# Patient Record
Sex: Female | Born: 1970 | Race: Black or African American | Hispanic: No | Marital: Single
Health system: Southern US, Community
[De-identification: ages and names within clinical notes are randomized; demographics above are authoritative.]

## PROBLEM LIST (undated history)

## (undated) DIAGNOSIS — F32A Depression, unspecified: Secondary | ICD-10-CM

## (undated) DIAGNOSIS — E559 Vitamin D deficiency, unspecified: Secondary | ICD-10-CM

## (undated) DIAGNOSIS — M797 Fibromyalgia: Secondary | ICD-10-CM

## (undated) DIAGNOSIS — R7303 Prediabetes: Secondary | ICD-10-CM

## (undated) DIAGNOSIS — M549 Dorsalgia, unspecified: Secondary | ICD-10-CM

## (undated) DIAGNOSIS — N979 Female infertility, unspecified: Secondary | ICD-10-CM

## (undated) DIAGNOSIS — G4733 Obstructive sleep apnea (adult) (pediatric): Secondary | ICD-10-CM

## (undated) DIAGNOSIS — R0602 Shortness of breath: Secondary | ICD-10-CM

## (undated) DIAGNOSIS — M1990 Unspecified osteoarthritis, unspecified site: Secondary | ICD-10-CM | POA: Insufficient documentation

## (undated) DIAGNOSIS — G4730 Sleep apnea, unspecified: Secondary | ICD-10-CM

## (undated) DIAGNOSIS — D649 Anemia, unspecified: Secondary | ICD-10-CM

## (undated) DIAGNOSIS — E119 Type 2 diabetes mellitus without complications: Secondary | ICD-10-CM | POA: Insufficient documentation

## (undated) DIAGNOSIS — E781 Pure hyperglyceridemia: Secondary | ICD-10-CM | POA: Insufficient documentation

## (undated) DIAGNOSIS — M2550 Pain in unspecified joint: Secondary | ICD-10-CM

## (undated) DIAGNOSIS — I10 Essential (primary) hypertension: Secondary | ICD-10-CM

## (undated) DIAGNOSIS — M7989 Other specified soft tissue disorders: Secondary | ICD-10-CM

## (undated) HISTORY — DX: Dorsalgia, unspecified: M54.9

## (undated) HISTORY — DX: Sleep apnea, unspecified: G47.30

## (undated) HISTORY — DX: Prediabetes: R73.03

## (undated) HISTORY — DX: Vitamin D deficiency, unspecified: E55.9

## (undated) HISTORY — DX: Anemia, unspecified: D64.9

## (undated) HISTORY — DX: Other specified soft tissue disorders: M79.89

## (undated) HISTORY — DX: Depression, unspecified: F32.A

## (undated) HISTORY — DX: Female infertility, unspecified: N97.9

## (undated) HISTORY — DX: Essential (primary) hypertension: I10

## (undated) HISTORY — DX: Pain in unspecified joint: M25.50

## (undated) HISTORY — DX: Shortness of breath: R06.02

---

## 2020-05-06 ENCOUNTER — Emergency Department (HOSPITAL_BASED_OUTPATIENT_CLINIC_OR_DEPARTMENT_OTHER): Payer: Medicaid Other

## 2020-05-06 ENCOUNTER — Other Ambulatory Visit: Payer: Self-pay

## 2020-05-06 ENCOUNTER — Encounter (HOSPITAL_COMMUNITY): Payer: Self-pay | Admitting: *Deleted

## 2020-05-06 ENCOUNTER — Emergency Department (HOSPITAL_COMMUNITY): Payer: Medicaid Other

## 2020-05-06 ENCOUNTER — Emergency Department (HOSPITAL_COMMUNITY)
Admission: EM | Admit: 2020-05-06 | Discharge: 2020-05-06 | Disposition: A | Discharge status: Home / Self Care | Payer: Medicaid Other | Attending: Emergency Medicine | Admitting: Emergency Medicine

## 2020-05-06 DIAGNOSIS — M79604 Pain in right leg: Secondary | ICD-10-CM | POA: Insufficient documentation

## 2020-05-06 HISTORY — DX: Fibromyalgia: M79.7

## 2020-05-06 HISTORY — DX: Unspecified osteoarthritis, unspecified site: M19.90

## 2020-05-06 MED ORDER — KETOROLAC TROMETHAMINE 60 MG/2ML IM SOLN
15.0000 mg | Freq: Once | INTRAMUSCULAR | Status: AC
  Administered 2020-05-06: 15 mg via INTRAMUSCULAR
  Filled 2020-05-06: qty 2

## 2020-05-06 MED ORDER — DICLOFENAC SODIUM 50 MG PO TBEC: 50.0000 mg | DELAYED_RELEASE_TABLET | Freq: Two times a day (BID) | ORAL | 0 refills | Status: AC

## 2020-05-06 NOTE — Discharge Instructions (Addendum)
You have been seen today for leg pain. Please read and follow all provided instructions. Return to the emergency room for worsening condition or new concerning symptoms.    The x-ray showed you have osteoarthritis which you already knew. The ultrasound did not show any signs of blood clot.  However it does show you could possibly have a muscle tear in your proximal medial calf.  1. Medications:  Prescription sent to your pharmacy for diclofenac.  This is used to treat osteoarthritis.  Do not take any additional anti-inflammatories with this medicine they are all similar.  Take it with food so it does not cause upset stomach.  Continue usual home medications Take medications as prescribed. Please review all of the medicines and only take them if you do not have an allergy to them.   2. Treatment: Elevate your leg is much as possible.  Wear a knee sleeve as we discussed.  3. Follow Up:   Please follow-up with orthopedics.  Dr. Lyla Glassing is on-call orthopedist.  Call his office tomorrow and ask to schedule the next available emergency department follow-up appointment  If you do not have a primary care physician, contact [facility] at [phone] for referral   It is also a possibility that you have an allergic reaction to any of the medicines that you have been prescribed - Everybody reacts differently to medications and while MOST people have no trouble with most medicines, you may have a reaction such as nausea, vomiting, rash, swelling, shortness of breath. If this is the case, please stop taking the medicine immediately and contact your physician.  ?

## 2020-05-06 NOTE — ED Provider Notes (Signed)
[facility] DEPT Provider Note   CSN: [id number] Arrival date & time: 05/06/20  1406     History Chief Complaint  Patient presents with   Leg Pain    Ashlee Gordon is a 49 y.o. female past medical history significant for fibromyalgia and osteoarthritis.  HPI Presents to emergency room today with chief complaint of progressively worsening right leg pain x1 week.  Patient states the pain is located behind her right knee.  The pain is constant, sharp and does not radiate.  She states this feels different than her fibromyalgia pain.  She is on gabapentin which has not helped her leg pain. No other medications tried for pain prior to arrival. She denies any injury or trauma to the leg. She denies any fever, chills, exogenous estrogen use, shortness of breath, cough, hemoptysis, numbness, tingling, weakness. Patient recently moved here from [place], [place].  She does not have a PCP however admits to having lab work done x2 months ago with no abnormal results.    Past Medical History:  Diagnosis Date   Fibromyalgia    Osteoarthritis     There are no problems to display for this patient.   History reviewed. No pertinent surgical history.   OB History   No obstetric history on file.     No family history on file.  Social History   Tobacco Use   Smoking status: Never Smoker   Smokeless tobacco: Never Used  Substance Use Topics   Alcohol use: Yes   Drug use: Not on file    Home Medications Prior to Admission medications   Medication Sig Start Date End Date Taking? Authorizing Provider  diclofenac (VOLTAREN) 50 MG EC tablet Take 1 tablet (50 mg total) by mouth 2 (two) times daily for 7 days. 05/07/20 05/14/20  Ashyra Cantin, Harley Hallmark, PA-C    Allergies    Patient has no allergy information on record.  Review of Systems   Review of Systems All other systems are reviewed and are negative for acute change except as noted in the  HPI.  Physical Exam Updated Vital Signs BP (!) 146/83    Pulse 86    Temp 98.1 F (36.7 C) (Oral)    Resp 18    Ht 5' 9"  (1.753 m)    LMP 11/25/2019    SpO2 98%   Physical Exam Vitals and nursing note reviewed.  Constitutional:      Appearance: She is well-developed. She is obese. She is not ill-appearing or toxic-appearing.  HENT:     Head: Normocephalic and atraumatic.     Nose: Nose normal.  Eyes:     General: No scleral icterus.       Right eye: No discharge.        Left eye: No discharge.     Conjunctiva/sclera: Conjunctivae normal.  Neck:     Vascular: No JVD.  Cardiovascular:     Rate and Rhythm: Normal rate and regular rhythm.     Pulses: Normal pulses.          Dorsalis pedis pulses are 2+ on the right side and 2+ on the left side.     Heart sounds: Normal heart sounds.  Pulmonary:     Effort: Pulmonary effort is normal.     Breath sounds: Normal breath sounds.  Abdominal:     General: There is no distension.  Musculoskeletal:        General: Normal range of motion.     Cervical back:  Normal range of motion.     Comments: Minimal swelling noted to right calf. Negative homans sign. Compartments are soft in right lower extremity. No warmth, erythema or overlying skin changes.   Full ROM of right hip, knee, ankle. Able to wiggle toes. Brisk cap refill.   Ambulates with normal gait  Skin:    General: Skin is warm and dry.  Neurological:     Mental Status: She is oriented to person, place, and time.     GCS: GCS eye subscore is 4. GCS verbal subscore is 5. GCS motor subscore is 6.     Comments: Fluent speech, no facial droop.  Sensation intact to sharp and dull on right lower extremity.  Psychiatric:        Behavior: Behavior normal.     ED Results / Procedures / Treatments   Labs (all labs ordered are listed, but only abnormal results are displayed) Labs Reviewed - No data to display  EKG None  Radiology DG Knee Complete 4 Views Right  Result Date:  05/06/2020 CLINICAL DATA:  Pain EXAM: RIGHT KNEE - COMPLETE 4+ VIEW COMPARISON:  None. FINDINGS: Alignment is anatomic. There is no acute fracture. A joint effusion is present. There are tricompartmental changes of osteoarthritis primarily involving medial and patellofemoral compartments. Joint space narrowing is mild. IMPRESSION: Mild osteoarthritis.  Joint effusion. Electronically Signed   By: Macy Mis M.D.   On: 05/06/2020 16:53   VAS [place] LOWER EXTREMITY VENOUS (DVT) (ONLY [facility] & [facility])  Result Date: 05/06/2020  Lower Venous DVTStudy Indications: Pain.  Limitations: Body habitus and unable to tolerate compression maneuvers due to pain. Comparison Study: No prior study Performing Technologist: Maudry Mayhew MHA, RDMS, RVT, RDCS  Examination Guidelines: A complete evaluation includes B-mode imaging, spectral Doppler, color Doppler, and power Doppler as needed of all accessible portions of each vessel. Bilateral testing is considered an integral part of a complete examination. Limited examinations for reoccurring indications may be performed as noted. The reflux portion of the exam is performed with the patient in reverse Trendelenburg.  +---------+---------------+---------+-----------+----------+--------------+  RIGHT     Compressibility Phasicity Spontaneity Properties Thrombus Aging  +---------+---------------+---------+-----------+----------+--------------+  FV Prox   Full                                                             +---------+---------------+---------+-----------+----------+--------------+  FV Mid                              Yes                                    +---------+---------------+---------+-----------+----------+--------------+  FV Distal                           Yes                                    +---------+---------------+---------+-----------+----------+--------------+  POP       Full            Yes       Yes                                     +---------+---------------+---------+-----------+----------+--------------+  PTV       Full                      Yes                                    +---------+---------------+---------+-----------+----------+--------------+   Right Technical Findings: Not visualized segments include CFV, SFJ, PFV, peroneal veins.  Left Technical Findings: Not visualized segments include CFV.   Summary: RIGHT: - There is no evidence of deep vein thrombosis in the lower extremity. However, portions of this examination were limited- see technologist comments above.  - No cystic structure found in the popliteal fossa.  - Anechoic area is noted in the proximal medial right calf measuring 5.4cm, suggestive of possible muscle tear versus unknown etiology.   *See table(s) above for measurements and observations.    Preliminary     Procedures Procedures (including critical care time)  Medications Ordered in ED Medications  ketorolac (TORADOL) injection 15 mg (15 mg Intramuscular Given 05/06/20 1620)    ED Course  I have reviewed the triage vital signs and the nursing notes.  Pertinent labs & imaging results that were available during my care of the patient were reviewed by me and considered in my medical decision making (see chart for details).  Clinical Course as of May 06 1744  Wed May 06, 2020  1708 Patient had elevated blood pressure in triage.  When checked with a right size cuff BP was normal at 146/83.  BP(!): 185/108 [KA]    Clinical Course User Index [KA] Anelis Hrivnak, Harley Hallmark, PA-C   MDM Rules/Calculators/A&P                            Final Clinical Impression(s) / ED Diagnoses Final diagnoses:  Right leg pain   History provided by patient with additional history obtained from chart review.     Patient presents to the ED with complaints of pain to the right leg without known injury. Exam without obvious deformity or open wounds. ROM intact. Tender to palpation posterior knee. NVI distally. Xray  negative for fracture/dislocation, does show osteoarthritis and joint effusion. DVT study is negative however does suggest possible tear of proximal medial calf. Patient is able to ambulate without difficulty. Exam is not suggestive of complete tear.  Therapeutic splint provided. PRICE and diclofenac recommended. Patient new to the area, had recent labs at pcp before moving with self reported normal results. Unable to see in care everywhere. Patient give low dose IM toradol and prescription for diclofenac given.  I discussed results, treatment plan, need for orthopedics follow-up, and return precautions with the patient. Provided opportunity for questions, patient confirmed understanding and are in agreement with plan.    Portions of this note were generated with [employer]. Dictation errors may occur despite best attempts at proofreading.  Rx / DC Orders ED Discharge Orders         Ordered    diclofenac (VOLTAREN) 50 MG EC tablet  2 times daily     Discontinue  Reprint     05/06/20 1735           Cherre Robins, PA-C 05/06/20 1745    Milton Ferguson, MD 05/07/20 [phone]

## 2020-05-06 NOTE — ED Triage Notes (Signed)
Pt complains of right leg pain x 1 week. She has tried cream for arthritis without relief. Pain worse with ambulation.

## 2020-05-06 NOTE — Progress Notes (Signed)
Right lower extremity venous duplex completed. Refer to "CV Proc" under chart review to view preliminary results.  05/06/2020 5:18 PM Kelby Aline., MHA, RVT, RDCS, RDMS

## 2020-06-25 LAB — EXTERNAL GENERIC LAB PROCEDURE: COLOGUARD: NEGATIVE

## 2020-06-25 LAB — COLOGUARD: COLOGUARD: NEGATIVE

## 2020-09-25 ENCOUNTER — Other Ambulatory Visit: Payer: Self-pay | Admitting: Internal Medicine

## 2020-09-25 DIAGNOSIS — Z1231 Encounter for screening mammogram for malignant neoplasm of breast: Secondary | ICD-10-CM

## 2020-11-05 ENCOUNTER — Ambulatory Visit: Payer: Self-pay

## 2020-11-06 ENCOUNTER — Ambulatory Visit
Admission: RE | Admit: 2020-11-06 | Discharge: 2020-11-06 | Disposition: A | Discharge status: Home / Self Care | Payer: Medicaid Other | Source: Ambulatory Visit | Attending: Internal Medicine | Admitting: Internal Medicine

## 2020-11-06 ENCOUNTER — Other Ambulatory Visit: Payer: Self-pay

## 2020-11-06 DIAGNOSIS — Z1231 Encounter for screening mammogram for malignant neoplasm of breast: Secondary | ICD-10-CM

## 2021-02-17 ENCOUNTER — Other Ambulatory Visit: Payer: Self-pay | Admitting: Chiropractic Medicine

## 2021-02-17 DIAGNOSIS — M5136 Other intervertebral disc degeneration, lumbar region: Secondary | ICD-10-CM

## 2021-06-18 ENCOUNTER — Other Ambulatory Visit: Payer: Self-pay | Admitting: Chiropractic Medicine

## 2021-06-18 DIAGNOSIS — M5416 Radiculopathy, lumbar region: Secondary | ICD-10-CM | POA: Insufficient documentation

## 2021-07-16 ENCOUNTER — Other Ambulatory Visit: Payer: Self-pay | Admitting: Physical Medicine and Rehabilitation

## 2021-07-16 DIAGNOSIS — M5416 Radiculopathy, lumbar region: Secondary | ICD-10-CM

## 2021-07-21 ENCOUNTER — Ambulatory Visit
Admission: RE | Admit: 2021-07-21 | Discharge: 2021-07-21 | Disposition: A | Discharge status: Home / Self Care | Payer: Self-pay | Source: Ambulatory Visit | Attending: Physical Medicine and Rehabilitation | Admitting: Physical Medicine and Rehabilitation

## 2021-07-21 ENCOUNTER — Other Ambulatory Visit (HOSPITAL_COMMUNITY): Payer: Self-pay | Admitting: Physical Medicine and Rehabilitation

## 2021-07-21 DIAGNOSIS — R52 Pain, unspecified: Secondary | ICD-10-CM

## 2021-07-26 ENCOUNTER — Telehealth (HOSPITAL_COMMUNITY): Payer: Self-pay

## 2021-07-26 NOTE — Telephone Encounter (Signed)
-----   Message from Arne Cleveland, MD sent at 07/26/2021  3:35 PM EDT ----- Regarding: RE: Nerve root block Yes that is fine  DDH   ----- Message ----- From: Danielle Dess Sent: 07/26/2021   2:59 PM EDT To: Arne Cleveland, MD Subject: Nerve root block                               Hassell,  I received an order from [facility] for a nerve root block (dx: lumbar radiculopathy). Pt is over the weight limit (407 lbs) and can't be done at GI. Is it ok for me to put this on for you next Thursday when you are here at [facility]? I have uploaded her most recent MRI for you to view.   Thanks,  Lia Foyer

## 2021-08-05 ENCOUNTER — Ambulatory Visit (HOSPITAL_COMMUNITY)
Admission: RE | Admit: 2021-08-05 | Discharge: 2021-08-05 | Disposition: A | Discharge status: Home / Self Care | Payer: Medicaid Other | Source: Ambulatory Visit | Attending: Physical Medicine and Rehabilitation | Admitting: Physical Medicine and Rehabilitation

## 2021-08-05 ENCOUNTER — Other Ambulatory Visit: Payer: Self-pay

## 2021-08-05 DIAGNOSIS — M5416 Radiculopathy, lumbar region: Secondary | ICD-10-CM | POA: Diagnosis present

## 2021-08-05 HISTORY — PX: IR INJECT/THERA/INC NEEDLE/CATH/PLC EPI/LUMB/SAC W/IMG: IMG6130

## 2021-08-05 MED ORDER — LIDOCAINE HCL 1 % IJ SOLN
INTRAMUSCULAR | Status: AC
  Administered 2021-08-05: 4 mL via INTRADERMAL
  Filled 2021-08-05: qty 20

## 2021-08-05 MED ORDER — IOHEXOL 300 MG/ML  SOLN
100.0000 mL | Freq: Once | INTRAMUSCULAR | Status: AC | PRN
  Administered 2021-08-05: 4 mL via INTRATHECAL

## 2021-08-05 MED ORDER — METHYLPREDNISOLONE ACETATE 80 MG/ML IJ SUSP
INTRAMUSCULAR | Status: AC
  Administered 2021-08-05: 80 mg via INTRADERMAL
  Filled 2021-08-05: qty 1

## 2021-08-05 MED ORDER — LIDOCAINE HCL 1 % IJ SOLN
INTRAMUSCULAR | Status: DC | PRN
  Administered 2021-08-05: 10 mL via INTRADERMAL

## 2021-08-05 NOTE — Procedures (Signed)
  Procedure: FLuoro guided bilat L5 nere root block and transforaminal ESI   EBL:   minimal Complications:  none immediate  See full dictation in [facility].  Dillard Cannon MD Main # [phone] Pager  [phone]

## 2022-01-06 DIAGNOSIS — S93409A Sprain of unspecified ligament of unspecified ankle, initial encounter: Secondary | ICD-10-CM | POA: Insufficient documentation

## 2022-01-06 DIAGNOSIS — M79671 Pain in right foot: Secondary | ICD-10-CM | POA: Insufficient documentation

## 2022-06-13 DIAGNOSIS — F329 Major depressive disorder, single episode, unspecified: Secondary | ICD-10-CM | POA: Insufficient documentation

## 2022-06-20 ENCOUNTER — Ambulatory Visit (INDEPENDENT_AMBULATORY_CARE_PROVIDER_SITE_OTHER): Payer: Medicaid Other | Admitting: Podiatry

## 2022-06-20 ENCOUNTER — Encounter: Payer: Self-pay | Admitting: Podiatry

## 2022-06-20 ENCOUNTER — Ambulatory Visit (INDEPENDENT_AMBULATORY_CARE_PROVIDER_SITE_OTHER): Payer: Medicaid Other

## 2022-06-20 DIAGNOSIS — M21621 Bunionette of right foot: Secondary | ICD-10-CM

## 2022-06-20 DIAGNOSIS — M7751 Other enthesopathy of right foot: Secondary | ICD-10-CM | POA: Diagnosis not present

## 2022-06-20 DIAGNOSIS — M722 Plantar fascial fibromatosis: Secondary | ICD-10-CM

## 2022-06-20 MED ORDER — TRIAMCINOLONE ACETONIDE 10 MG/ML IJ SUSP
20.0000 mg | Freq: Once | INTRAMUSCULAR | Status: AC
  Administered 2022-06-20: 20 mg

## 2022-06-20 NOTE — Progress Notes (Signed)
Subjective:   Patient ID: Ashlee Gordon, female   DOB: 51 y.o.   MRN: [id number]   HPI Patient has developed pain in the right ankle and a secondary pain in the right arch and heel over the last year.  States that she does not remember specific injury she is obese which is complicating factor with flatfoot deformity and does not smoke likes to be active   Review of Systems  All other systems reviewed and are negative.       Objective:  Physical Exam Vitals and nursing note reviewed.  Constitutional:      Appearance: She is well-developed.  Pulmonary:     Effort: Pulmonary effort is normal.  Musculoskeletal:        General: Normal range of motion.  Skin:    General: Skin is warm.  Neurological:     Mental Status: She is alert.     Neurovascular status intact muscle strength adequate range of motion adequate with exquisite discomfort sinus tarsi extending to the right ankle gutter right and also the plantar fascial band right at the insertion of the point into the calcaneus with again obesity is complicating factor.  Good digital perfusion well-oriented     Assessment:  Several problems with obesity is a long-term problem with inflammation pain of the sinus tarsi right and also the plantar fascial band right at insertion H&P     Plan:  H&P reviewed all conditions with patient and went ahead today and did sterile prep injected the sinus tarsi right and into the lateral ankle gutter 3 mg Kenalog 5 mg Xylocaine in the plantar fascial band at insertion 3 mg Dexasone Kenalog 5 mg Xylocaine advised on supportive shoes rigid bottom shoes and high arches.  Reappoint if symptoms persist  X-rays indicate moderate depression of the arch no indications of advanced arthritis subtalar joint or midfoot arthritis

## 2022-08-04 ENCOUNTER — Institutional Professional Consult (permissible substitution): Payer: Medicaid Other | Admitting: Plastic Surgery

## 2022-08-05 ENCOUNTER — Institutional Professional Consult (permissible substitution): Payer: Medicaid Other | Admitting: Plastic Surgery

## 2022-08-08 ENCOUNTER — Other Ambulatory Visit: Payer: Self-pay

## 2022-08-08 ENCOUNTER — Emergency Department (HOSPITAL_COMMUNITY)
Admission: EM | Admit: 2022-08-08 | Discharge: 2022-08-09 | Disposition: A | Discharge status: Home / Self Care | Payer: Medicaid Other | Attending: Student | Admitting: Student

## 2022-08-08 ENCOUNTER — Encounter (HOSPITAL_COMMUNITY): Payer: Self-pay | Admitting: Emergency Medicine

## 2022-08-08 DIAGNOSIS — R350 Frequency of micturition: Secondary | ICD-10-CM | POA: Insufficient documentation

## 2022-08-08 DIAGNOSIS — R109 Unspecified abdominal pain: Secondary | ICD-10-CM | POA: Diagnosis present

## 2022-08-08 DIAGNOSIS — R7989 Other specified abnormal findings of blood chemistry: Secondary | ICD-10-CM

## 2022-08-08 DIAGNOSIS — R5383 Other fatigue: Secondary | ICD-10-CM | POA: Insufficient documentation

## 2022-08-08 DIAGNOSIS — N12 Tubulo-interstitial nephritis, not specified as acute or chronic: Secondary | ICD-10-CM | POA: Diagnosis not present

## 2022-08-08 DIAGNOSIS — R944 Abnormal results of kidney function studies: Secondary | ICD-10-CM | POA: Insufficient documentation

## 2022-08-08 DIAGNOSIS — Z20822 Contact with and (suspected) exposure to covid-19: Secondary | ICD-10-CM | POA: Diagnosis not present

## 2022-08-08 LAB — COMPREHENSIVE METABOLIC PANEL
ALT: 37 U/L (ref 0–44)
AST: 36 U/L (ref 15–41)
Albumin: 2.6 g/dL — ABNORMAL LOW (ref 3.5–5.0)
Alkaline Phosphatase: 163 U/L — ABNORMAL HIGH (ref 38–126)
Anion gap: 11 (ref 5–15)
BUN: 18 mg/dL (ref 6–20)
CO2: 24 mmol/L (ref 22–32)
Calcium: 8.4 mg/dL — ABNORMAL LOW (ref 8.9–10.3)
Chloride: 102 mmol/L (ref 98–111)
Creatinine, Ser: 1.72 mg/dL — ABNORMAL HIGH (ref 0.44–1.00)
GFR, Estimated: 36 mL/min — ABNORMAL LOW (ref >60)
Glucose, Bld: 110 mg/dL — ABNORMAL HIGH (ref 70–99)
Potassium: 3.9 mmol/L (ref 3.5–5.1)
Sodium: 137 mmol/L (ref 135–145)
Total Bilirubin: 1.1 mg/dL (ref 0.3–1.2)
Total Protein: 7.1 g/dL (ref 6.5–8.1)

## 2022-08-08 LAB — URINALYSIS, ROUTINE W REFLEX MICROSCOPIC
Bilirubin Urine: NEGATIVE
Glucose, UA: NEGATIVE mg/dL
Ketones, ur: NEGATIVE mg/dL
Nitrite: NEGATIVE
Protein, ur: 100 mg/dL — AB
RBC / HPF: >50 RBC/hpf — ABNORMAL HIGH (ref 0–5)
Specific Gravity, Urine: 1.014 (ref 1.005–1.030)
pH: 6 (ref 5.0–8.0)

## 2022-08-08 LAB — RESP PANEL BY RT-PCR (FLU A&B, COVID) ARPGX2
Influenza A by PCR: NEGATIVE
Influenza B by PCR: NEGATIVE
SARS Coronavirus 2 by RT PCR: NEGATIVE

## 2022-08-08 LAB — CBC WITH DIFFERENTIAL/PLATELET
Abs Immature Granulocytes: 0.03 10*3/uL (ref 0.00–0.07)
Basophils Absolute: 0 10*3/uL (ref 0.0–0.1)
Basophils Relative: 0 %
Eosinophils Absolute: 0 10*3/uL (ref 0.0–0.5)
Eosinophils Relative: 0 %
HCT: 38.3 % (ref 36.0–46.0)
Hemoglobin: 12.3 g/dL (ref 12.0–15.0)
Immature Granulocytes: 0 %
Lymphocytes Relative: 21 %
Lymphs Abs: 1.9 10*3/uL (ref 0.7–4.0)
MCH: 27.5 pg (ref 26.0–34.0)
MCHC: 32.1 g/dL (ref 30.0–36.0)
MCV: 85.7 fL (ref 80.0–100.0)
Monocytes Absolute: 0.9 10*3/uL (ref 0.1–1.0)
Monocytes Relative: 10 %
Neutro Abs: 6.2 10*3/uL (ref 1.7–7.7)
Neutrophils Relative %: 69 %
Platelets: 183 10*3/uL (ref 150–400)
RBC: 4.47 MIL/uL (ref 3.87–5.11)
RDW: 15.7 % — ABNORMAL HIGH (ref 11.5–15.5)
WBC: 9.1 10*3/uL (ref 4.0–10.5)
nRBC: 0 % (ref 0.0–0.2)

## 2022-08-08 LAB — I-STAT BETA HCG BLOOD, ED (MC, WL, AP ONLY): I-stat hCG, quantitative: <5 m[IU]/mL (ref <5)

## 2022-08-08 MED ORDER — ACETAMINOPHEN 325 MG PO TABS
650.0000 mg | ORAL_TABLET | Freq: Once | ORAL | Status: AC
  Administered 2022-08-08: 650 mg via ORAL
  Filled 2022-08-08: qty 2

## 2022-08-08 NOTE — ED Triage Notes (Signed)
Patient here with complaint of fatigue and generalized body aches that started two weeks ago but got worse approximately five days ago. Patient is alert, oriented, and in no apparent distress at this time.

## 2022-08-08 NOTE — ED Provider Triage Note (Signed)
Emergency Medicine Provider Triage Evaluation Note  Ashlee Gordon , a 51 y.o. female  was evaluated in triage.  Pt complains of generally feeling unwell.  States that this has been ongoing for 2 weeks.  States that more recently her urine has become more concentrated and she has noticed blood in her urine.  She has had some left lower quadrant abdominal pain and chills.  He denies objective fever at home.  States that she generally feels tired and has "pain all over."  She does have a history of fibromyalgia but states that this pain pattern is different.  Denies nausea, vomiting, diarrhea, chest pain, shortness of breath, cough.  Review of Systems  Positive: See above Negative:   Physical Exam  BP (!) 142/74 (BP Location: Right Arm)   Pulse (!) 109   Temp (!) 102 F (38.9 C) (Oral)   Resp 15   SpO2 98%  Gen:   Awake, no distress   Resp:  Normal effort  MSK:   Moves extremities without difficulty  Other:  Mild abdominal tenderness to palpation.  Otherwise no focal findings.  Medical Decision Making  Medically screening exam initiated at 6:04 PM.  Appropriate orders placed.  Markia Marzette was informed that the remainder of the evaluation will be completed by another provider, this initial triage assessment does not replace that evaluation, and the importance of remaining in the ED until their evaluation is complete.     Mickie Hillier, PA-C 08/08/22 [phone]

## 2022-08-09 ENCOUNTER — Emergency Department (HOSPITAL_COMMUNITY): Payer: Medicaid Other

## 2022-08-09 ENCOUNTER — Institutional Professional Consult (permissible substitution): Payer: Medicaid Other | Admitting: Plastic Surgery

## 2022-08-09 MED ORDER — SODIUM CHLORIDE 0.9 % IV SOLN
1.0000 g | Freq: Once | INTRAVENOUS | Status: AC
  Administered 2022-08-09: 1 g via INTRAVENOUS
  Filled 2022-08-09 (×2): qty 10

## 2022-08-09 MED ORDER — SODIUM CHLORIDE 0.9 % IV BOLUS
1000.0000 mL | Freq: Once | INTRAVENOUS | Status: AC
  Administered 2022-08-09: 1000 mL via INTRAVENOUS

## 2022-08-09 MED ORDER — CEFDINIR 300 MG PO CAPS: 300.0000 mg | ORAL_CAPSULE | Freq: Two times a day (BID) | ORAL | 0 refills | Status: DC

## 2022-08-09 MED ORDER — ACETAMINOPHEN 500 MG PO TABS
1000.0000 mg | ORAL_TABLET | Freq: Once | ORAL | Status: AC
  Administered 2022-08-09: 1000 mg via ORAL
  Filled 2022-08-09: qty 2

## 2022-08-09 NOTE — ED Notes (Signed)
Called lab they do have the covid swab and are running it now

## 2022-08-09 NOTE — Discharge Instructions (Addendum)
Please read and follow all provided instructions.  Your diagnoses today include:  1. Left flank pain   2. Pyelonephritis     Tests performed today include: Blood cell counts and platelets Kidney and liver function tests: Your kidney function test, also called creatinine, was elevated showing weak kidneys today.  You will need to have this checked by your primary care doctor.  Please let them know about this at follow-up. Pancreas function test (called lipase) Urine test to look for infection: Shows blood and some infection fighting cells, culture pending CT scan of the abdomen pelvis showed swelling of the left kidney without obvious kidney stone, potentially due to an infection Vital signs. See below for your results today.   Medications prescribed:  Cefdinir: Antibiotic for urinary tract infection  Take any prescribed medications only as directed.  Home care instructions:  Follow any educational materials contained in this packet.  Follow-up instructions: Please follow-up with your primary care provider in the next 2 days for further evaluation of your symptoms.    Return instructions:  SEEK IMMEDIATE MEDICAL ATTENTION IF: The pain does not go away or becomes severe  A temperature above 101F develops  Repeated vomiting occurs (multiple episodes)  The pain becomes localized to portions of the abdomen. The right side could possibly be appendicitis. In an adult, the left lower portion of the abdomen could be colitis or diverticulitis.  Blood is being passed in stools or vomit (bright red or black tarry stools)  You develop chest pain, difficulty breathing, dizziness or fainting, or become confused, poorly responsive, or inconsolable (young children) If you have any other emergent concerns regarding your health  Additional Information: Abdominal (belly) pain can be caused by many things. Your caregiver performed an examination and possibly ordered blood/urine tests and imaging (CT  scan, x-rays, ultrasound). Many cases can be observed and treated at home after initial evaluation in the emergency department. Even though you are being discharged home, abdominal pain can be unpredictable. Therefore, you need a repeated exam if your pain does not resolve, returns, or worsens. Most patients with abdominal pain don't have to be admitted to the hospital or have surgery, but serious problems like appendicitis and gallbladder attacks can start out as nonspecific pain. Many abdominal conditions cannot be diagnosed in one visit, so follow-up evaluations are very important.  Your vital signs today were: BP (!) 166/86   Pulse 99   Temp 98.6 F (37 C) (Oral)   Resp 18   SpO2 99%  If your blood pressure (bp) was elevated above 135/85 this visit, please have this repeated by your doctor within one month. --------------

## 2022-08-09 NOTE — ED Provider Notes (Signed)
[facility] EMERGENCY DEPARTMENT Provider Note   CSN: [id number] Arrival date & time: 08/08/22  1609     History  Chief Complaint  Patient presents with   Fatigue    Ashlee Gordon is a 51 y.o. female.  Patient with history of chronic pain, fibromyalgia, rheumatoid arthritis --presents to the emergency department for approximately 4 days of left lower abdominal pain.  Patient has been sharp and has been causing her difficulty sleeping.  No associated nausea, vomiting, diarrhea or constipation.  She has had blood in her urine and dark urine that she noted starting afterwards.  Also increased frequency of urination.  No fevers.  She feels that she has been eating and drinking well.  She denies a history of renal dysfunction.  States that she gets routine physicals from her provider at [facility].  She states that she has been told that she has had a kidney stone in the past, but is unable to tell me how this was diagnosed.  She has been taking meloxicam and tramadol at home without improvement.       Home Medications Prior to Admission medications   Medication Sig Start Date End Date Taking? Authorizing Provider  buPROPion (WELLBUTRIN XL) 150 MG 24 hr tablet Take 150 mg by mouth daily. 06/13/22   [provider]  buPROPion (WELLBUTRIN XL) 300 MG 24 hr tablet Take 300 mg by mouth daily. 06/13/22   [provider]  cyclobenzaprine (FLEXERIL) 5 MG tablet Take 5 mg by mouth at bedtime as needed. 03/03/22   [provider]  meloxicam (MOBIC) 15 MG tablet Take 15 mg by mouth daily. 03/26/22   [provider]  traMADol HCl 100 MG TABS Take by mouth. 04/14/22   [provider]      Allergies    Patient has no known allergies.    Review of Systems   Review of Systems  Physical Exam Updated Vital Signs BP (!) 166/86   Pulse 99   Temp 98.6 F (37 C) (Oral)   Resp 18   SpO2 99%  Physical Exam Vitals and nursing  note reviewed.  Constitutional:      General: She is not in acute distress.    Appearance: She is well-developed.  HENT:     Head: Normocephalic and atraumatic.     Right Ear: External ear normal.     Left Ear: External ear normal.     Nose: Nose normal.  Eyes:     Conjunctiva/sclera: Conjunctivae normal.  Cardiovascular:     Rate and Rhythm: Normal rate and regular rhythm.     Heart sounds: No murmur heard. Pulmonary:     Effort: No respiratory distress.     Breath sounds: No wheezing, rhonchi or rales.  Abdominal:     Palpations: Abdomen is soft.     Tenderness: There is abdominal tenderness. There is no guarding or rebound.     Comments: No erythema, redness, swelling, drainage from pannus.  Musculoskeletal:     Cervical back: Normal range of motion and neck supple.     Right lower leg: No edema.     Left lower leg: No edema.  Skin:    General: Skin is warm and dry.     Findings: No rash.  Neurological:     General: No focal deficit present.     Mental Status: She is alert. Mental status is at baseline.     Motor: No weakness.  Psychiatric:  Mood and Affect: Mood normal.     ED Results / Procedures / Treatments   Labs (all labs ordered are listed, but only abnormal results are displayed) Labs Reviewed  COMPREHENSIVE METABOLIC PANEL - Abnormal; Notable for the following components:      Result Value   Glucose, Bld 110 (*)    Creatinine, Ser 1.72 (*)    Calcium 8.4 (*)    Albumin 2.6 (*)    Alkaline Phosphatase 163 (*)    GFR, Estimated 36 (*)    All other components within normal limits  CBC WITH DIFFERENTIAL/PLATELET - Abnormal; Notable for the following components:   RDW 15.7 (*)    All other components within normal limits  URINALYSIS, ROUTINE W REFLEX MICROSCOPIC - Abnormal; Notable for the following components:   Color, Urine AMBER (*)    APPearance HAZY (*)    Hgb urine dipstick MODERATE (*)    Protein, ur 100 (*)    Leukocytes,Ua TRACE (*)     RBC / HPF >50 (*)    Bacteria, UA RARE (*)    All other components within normal limits  RESP PANEL BY RT-PCR (FLU A&B, COVID) ARPGX2  URINE CULTURE  I-STAT BETA HCG BLOOD, ED ([facility], [facility], [facility] ONLY)    EKG None  Radiology CT Renal Stone Study  Result Date: 08/09/2022 CLINICAL DATA:  Flank pain. Clinical concern for nephrolithiasis. EXAM: CT ABDOMEN AND PELVIS WITHOUT CONTRAST TECHNIQUE: Multidetector CT imaging of the abdomen and pelvis was performed following the standard protocol without IV contrast. RADIATION DOSE REDUCTION: This exam was performed according to the departmental dose-optimization program which includes automated exposure control, adjustment of the mA and/or kV according to patient size and/or use of iterative reconstruction technique. COMPARISON:  None Available. FINDINGS: Lower chest: Unremarkable. Hepatobiliary: Poorly distended gallbladder containing a 2.7 cm gallstone. No gallbladder wall thickening or pericholecystic fluid. Normal appearing liver. Pancreas: Unremarkable. No pancreatic ductal dilatation or surrounding inflammatory changes. Spleen: Normal in size without focal abnormality. Adrenals/Urinary Tract: Normal appearing adrenal glands, right kidney, ureters and urinary bladder. Enlarged left kidney with minimal perinephric soft tissue stranding. Mild left periureteric soft tissue stranding. No urinary tract calculi or hydronephrosis. 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 Mild right colon diverticulosis. Unremarkable small bowel and appendix. Vascular/Lymphatic: No significant vascular findings are present. No enlarged abdominal or pelvic lymph nodes. Reproductive: Enlarged uterus with mildly lobulated contours. No adnexal mass seen. Other: No abdominal wall hernia or abnormality. No abdominopelvic ascites. Musculoskeletal: Mild lumbar and lower thoracic spine degenerative changes and more pronounced  degenerative changes at the L5-S1 level with marked disc space narrowing and moderate anterior and posterior spur formation. IMPRESSION: 1. Enlarged left kidney with minimal perinephric soft tissue stranding and mild left periureteric soft tissue stranding. This could be due to pyelonephritis or a recently passed ureteral calculus. Correlation with urinalysis is recommended. 2. Cholelithiasis. 3. Mild right colon diverticulosis. 4. Enlarged uterus, compatible with fibroids. Electronically Signed   By: Claudie Revering M.D.   On: 08/09/2022 10:51    Procedures Procedures    Medications Ordered in ED Medications  sodium chloride 0.9 % bolus 1,000 mL (has no administration in time range)  acetaminophen (TYLENOL) tablet 650 mg (650 mg Oral Given 08/08/22 1813)    ED Course/ Medical Decision Making/ A&P    Patient seen and examined. History obtained directly from patient. Work-up including labs, imaging, EKG ordered in triage, if performed, were reviewed.  Labs/EKG: Independently reviewed and interpreted.  This included: CBC with normal white blood cell count, normal red blood cell count, otherwise unremarkable; CMP creatinine elevated 1.72, unknown if this is acute or chronic, alk phos noted at 163, normal electrolytes and transaminases; negative pregnancy; negative COVID and flu testing; UA with hematuria, 6-10 squamous epithelial cells, 6-10 white blood cells under high-power field.  Imaging: Ordered CT renal protocol  Medications/Fluids: Ordered: IV fluid bolus due to renal dysfunction   Most recent vital signs reviewed and are as follows: BP (!) 166/86   Pulse 99   Temp 98.6 F (37 C) (Oral)   Resp 18   SpO2 99%   Initial impression: Left sided abdominal pain and hematuria    11:45 AM Reassessment performed. Patient appears stable.   Reviewed pertinent lab work and imaging with patient at bedside. Questions answered.   Plan: IV antibiotics, send urine culture    1:45 PM  Reassessment performed. Patient appears stable.  Reviewed pertinent lab work and imaging with patient at bedside. Questions answered.   Most current vital signs reviewed and are as follows: BP (!) 181/118 (BP Location: Right Arm)   Pulse 89   Temp 98.5 F (36.9 C) (Oral)   Resp 16   SpO2 100%   Plan: Discharge to home.   Prescriptions written for: Cefdinir  Other home care instructions discussed: Rest, maintain good hydration  ED return instructions discussed: Patient urged to return with worsening symptoms or other concerns. Patient verbalized understanding and agrees with plan.   Follow-up instructions discussed: Patient encouraged to follow-up with their PCP in 2-3 days.  Discussed the importance of this at length, including not only recheck of symptoms, however need for recheck of creatinine.  Discussed that if symptoms are persistent or not improving, that she may need to follow-up with a urologist in the near future.                           Medical Decision Making Amount and/or Complexity of Data Reviewed Labs: ordered. Radiology: ordered.  Risk OTC drugs. Prescription drug management.   For this patient's complaint of abdominal pain, the following conditions were considered on the differential diagnosis: gastritis/PUD, enteritis/duodenitis, appendicitis, cholelithiasis/cholecystitis, cholangitis, pancreatitis, ruptured viscus, colitis, diverticulitis, small/large bowel obstruction, proctitis, cystitis, pyelonephritis, ureteral colic, aortic dissection, aortic aneurysm. In women, ectopic pregnancy, pelvic inflammatory disease, ovarian cysts, and tubo-ovarian abscess were also considered. Atypical chest etiologies were also considered including ACS, PE, and pneumonia.   Patient with left-sided hydronephrosis which is likely related to her ongoing left-sided abdominal pain.  She does have some irritative UTI symptoms.  Mainly blood in the UA but some white blood cells.   Normal white blood cell count.  Will cover for pyelonephritis given lack of obvious ureteral stone or bladder stone on CT.  Started on Keflex here and provided with prescription for cefdinir for home.  Labs overall reassuring, except that her creatinine is elevated today.  This will need to be monitored closely to ensure that it returns to her baseline.  Unclear what her baseline is at this point, but she states that she does not have a history of chronic kidney disease, so assume that normal creatinine is near normal.  She was treated with a IV fluid bolus in the ED.  Do not feel that she requires admission today for AKI given that she is hydrating well.  CT was done without contrast.  The patient's vital signs,  pertinent lab work and imaging were reviewed and interpreted as discussed in the ED course. Hospitalization was considered for further testing, treatments, or serial exams/observation. However as patient is well-appearing, has a stable exam, and otherwise reassuring studies today, I do not feel that they warrant admission at this time. This plan was discussed with the patient who verbalizes agreement and comfort with this plan and seems reliable and able to return to the Emergency Department with worsening or changing symptoms.          Final Clinical Impression(s) / ED Diagnoses Final diagnoses:  Left flank pain  Pyelonephritis  Elevated serum creatinine    Rx / DC Orders ED Discharge Orders          Ordered    cefdinir (OMNICEF) 300 MG capsule  2 times daily        08/09/22 1332              Carlisle Cater, PA-C 08/09/22 1414    [place], [place], MD 08/09/22 [id number]

## 2022-08-11 LAB — URINE CULTURE: Culture: 40000 — AB

## 2022-08-12 ENCOUNTER — Telehealth (HOSPITAL_BASED_OUTPATIENT_CLINIC_OR_DEPARTMENT_OTHER): Payer: Self-pay | Admitting: Emergency Medicine

## 2022-08-12 NOTE — Telephone Encounter (Signed)
Post ED Visit - Positive Culture Follow-up  Culture report reviewed by antimicrobial stewardship pharmacist: [place] Team []  Elenor Quinones, Pharm.D. []  Heide Guile, Pharm.D., BCPS AQ-ID []  Parks Neptune, Pharm.D., BCPS []  Alycia Rossetti, Pharm.D., BCPS []  [place], Pharm.D., BCPS, AAHIVP []  Legrand Como, Pharm.D., BCPS, AAHIVP []  Salome Arnt, PharmD, BCPS []  Johnnette Gourd, PharmD, BCPS []  Hughes Better, PharmD, BCPS []  Leeroy Cha, PharmD []  Laqueta Linden, PharmD, BCPS []  Albertina Parr, PharmD Erskine Speed PharmD  [place] Team []  Leodis Sias, PharmD []  Lindell Spar, PharmD []  Royetta Asal, PharmD []  Graylin Shiver, Rph []  Rema Fendt) Glennon Mac, PharmD []  Arlyn Dunning, PharmD []  Netta Cedars, PharmD []  Dia Sitter, PharmD []  Leone Haven, PharmD []  Gretta Arab, PharmD []  Theodis Shove, PharmD []  Peggyann Juba, PharmD []  Reuel Boom, PharmD   Positive urine culture Treated with cefdinir, organism sensitive to the same and no further patient follow-up is required at this time.  Hazle Nordmann 08/12/2022, 9:39 AM

## 2022-08-17 ENCOUNTER — Encounter: Payer: Self-pay | Admitting: Plastic Surgery

## 2022-08-17 ENCOUNTER — Ambulatory Visit (INDEPENDENT_AMBULATORY_CARE_PROVIDER_SITE_OTHER): Payer: Medicaid Other | Admitting: Plastic Surgery

## 2022-08-17 VITALS — BP 165/114 | HR 95 | Ht 69.0 in | Wt 398.6 lb

## 2022-08-17 DIAGNOSIS — E669 Obesity, unspecified: Secondary | ICD-10-CM

## 2022-08-17 DIAGNOSIS — Z6841 Body Mass Index (BMI) 40.0 and over, adult: Secondary | ICD-10-CM | POA: Diagnosis not present

## 2022-08-17 DIAGNOSIS — N62 Hypertrophy of breast: Secondary | ICD-10-CM | POA: Diagnosis not present

## 2022-08-17 DIAGNOSIS — M549 Dorsalgia, unspecified: Secondary | ICD-10-CM

## 2022-08-17 NOTE — Progress Notes (Signed)
   Referring Provider Center, [facility] [place],  [place] [address]   CC:  Chief Complaint  Patient presents with   Advice Only      Ashlee Gordon is an 51 y.o. female.  HPI:   Ashlee Gordon is a 51 year old female referred for consideration of breast reduction.  Patient reports large breasts since puberty and notes discomfort in her back.  No Known Allergies  Outpatient Encounter Medications as of 08/17/2022  Medication Sig   buPROPion (WELLBUTRIN XL) 150 MG 24 hr tablet Take 150 mg by mouth daily.   cyclobenzaprine (FLEXERIL) 5 MG tablet Take 5 mg by mouth at bedtime as needed.   meloxicam (MOBIC) 15 MG tablet Take 15 mg by mouth daily.   OZEMPIC, 1 MG/DOSE, 4 MG/3ML SOPN Inject into the skin.   traMADol HCl 100 MG TABS Take by mouth.   [DISCONTINUED] cefdinir (OMNICEF) 300 MG capsule Take 1 capsule (300 mg total) by mouth 2 (two) times daily.   [DISCONTINUED] buPROPion (WELLBUTRIN XL) 300 MG 24 hr tablet Take 300 mg by mouth daily.   No facility-administered encounter medications on file as of 08/17/2022.     Past Medical History:  Diagnosis Date   Fibromyalgia    Osteoarthritis     Past Surgical History:  Procedure Laterality Date   IR INJECT/THERA/INC NEEDLE/CATH/PLC EPI/LUMB/SAC W/IMG  08/05/2021    Family History  Problem Relation Age of Onset   Breast cancer Neg Hx     Social History   Social History Narrative   ** Merged History Encounter **         Review of Systems General: Denies fevers, chills, weight loss CV: Denies chest pain, shortness of breath, palpitations Breast: Patient complains of large breasts.  Physical Exam    08/17/2022    9:03 AM 08/09/2022    1:55 PM 08/09/2022    9:21 AM  Vitals with BMI  Height 5\' 9"     Weight 398 lbs 10 oz    BMI 86.76    Systolic 195 093 267  Diastolic 124 580 86  Pulse 95 89 99    General:  No acute distress,  Alert and oriented, Non-Toxic, Normal speech and affect BMI 58.86 BSA  2.97 Breasts: The patient as expected has extraordinarily large and very heavy breasts. Mammography: Mammogram from January 2022 BI-RADS 1  Assessment/Plan Obesity: The patient is currently undergoing medical therapy for obesity.  At this point she is not a candidate for breast reduction due to her size.  She would be at extraordinarily high risk for medical complications and complications at the surgery sites.  I have discussed this with her she understands.  She will be referred to healthy weight and wellness.  We discussed continued strategies for exercise including water aerobics, and when she is comfortable with that ,walking.  We will begin consideration of surgery when she is at a BMI of 45 or less.  She may return at any point to discuss her progress with weight loss.  Camillia Herter 08/17/2022, 9:26 AM

## 2022-10-27 ENCOUNTER — Ambulatory Visit (INDEPENDENT_AMBULATORY_CARE_PROVIDER_SITE_OTHER): Payer: Medicaid Other | Admitting: Internal Medicine

## 2022-10-27 ENCOUNTER — Encounter (INDEPENDENT_AMBULATORY_CARE_PROVIDER_SITE_OTHER): Payer: Self-pay | Admitting: Internal Medicine

## 2022-10-27 VITALS — BP 148/83 | HR 111 | Temp 98.2°F | Ht 68.0 in | Wt >= 6400 oz

## 2022-10-27 DIAGNOSIS — N179 Acute kidney failure, unspecified: Secondary | ICD-10-CM | POA: Diagnosis not present

## 2022-10-27 DIAGNOSIS — G4733 Obstructive sleep apnea (adult) (pediatric): Secondary | ICD-10-CM

## 2022-10-27 DIAGNOSIS — R030 Elevated blood-pressure reading, without diagnosis of hypertension: Secondary | ICD-10-CM | POA: Diagnosis not present

## 2022-10-27 DIAGNOSIS — Z6841 Body Mass Index (BMI) 40.0 and over, adult: Secondary | ICD-10-CM

## 2022-10-27 DIAGNOSIS — R7303 Prediabetes: Secondary | ICD-10-CM

## 2022-10-27 DIAGNOSIS — Z0289 Encounter for other administrative examinations: Secondary | ICD-10-CM

## 2022-10-27 NOTE — Progress Notes (Signed)
Office: [phone]  /  Fax: [phone]   Initial Visit  Ashlee Gordon was seen in clinic today to evaluate for obesity. She is interested in losing weight to improve overall health and reduce the risk of weight related complications. She presents today to review program treatment options, initial physical assessment, and evaluation.   She is a patient of [facility] and has been seeing them for primary care and also weight management.  She has a history of gastric sleeve surgery with a peak weight of 536 and a nadir of 400.  She has been able to maintain weight but has plateaued.  She needs to lose additional weight in order to to have breast reduction surgery.  She was referred by: Specialist referred by breast surgeon  When asked what else they would like to accomplish? She states: Improve energy levels and physical activity, Improve existing medical conditions, Reduce risk for a surgery, and Improve quality of life  When asked how has your weight affected you? She states: Contributed to medical problems, Contributed to orthopedic problems or mobility issues, Having fatigue, and Having poor endurance  Some associated conditions: OSA, Prediabetes, and Other: Elevated blood pressure  Contributing factors: Family history, Disruption of circadian rhythm, Nutritional, Reduced physical activity, Life event, and Other: Metabolic adaptations following gastric sleeve surgery  Weight promoting medications identified: None  Current nutrition plan: She is monitoring portions and eating vegetables and fruits.  Current level of physical activity: None  Current or previous pharmacotherapy: GLP-1  Response to medication: Had side effects so it was discontinued   Past medical history includes:   Past Medical History:  Diagnosis Date   Fibromyalgia    Osteoarthritis      Objective:   BP (!) 148/83   Pulse (!) 111   Temp 98.2 F (36.8 C)   Ht 5\' 8"  (1.727 m)   Wt (!) 401 lb (181.9  kg)   SpO2 96%   BMI 60.97 kg/m  She was weighed on the bioimpedance scale: Body mass index is 60.97 kg/m.  Peak Weight: 536, Body Fat%: 59%, Visceral Fat Rating: 26, Weight trend over the last 12 months: Decreasing  General:  Alert, oriented and cooperative. Patient is in no acute distress.  Respiratory: Normal respiratory effort, no problems with respiration noted  Extremities: Normal range of motion.    Mental Status: Normal mood and affect. Normal behavior. Normal judgment and thought content.   Assessment and Plan:  1. Class 3 severe obesity with serious comorbidity and body mass index (BMI) of 60.0 to 69.9 in adult, unspecified obesity type ([place]) We reviewed weight, biometrics, associated medical conditions and contributing factors with patient. She would benefit from weight loss therapy via a modified calorie, low-carb, high-protein nutritional plan tailored to their REE (resting energy expenditure) which will be determined by indirect calorimetry.  We will also assess for cardiometabolic risk and nutritional derangements via fasting serologies at her next appointment.  2. OSA (obstructive sleep apnea) Untreated.  Patient unable to tolerate Pap therapy.  Counseled on risks associated with untreated sleep apnea.  She also has elevated blood pressure.  Losing 10 to 15% of body weight may reduce AHI.  3. Prediabetes Based on history.  I am unable to see blood work from [facility].  We will check hemoglobin A1c, fasting blood sugar and insulin levels with intake labs.  4. Elevated blood pressure reading without diagnosis of hypertension Repeat blood pressure 150/95.  She is not on medications and may have indeed  hypertension undiagnosed.  She has a blood pressure monitor.  Advised to check blood pressure in the morning and before bedtime and log for 7 days.  She will contact her PCP if her blood pressure remains above 120/80.  Patient counseled on risk associated with uncontrolled  blood pressure.  Weight loss therapy will help but she needs to likely start pharmacotherapy in the interim.   5. AKI Based on labs from October she had a GFR of 36.  She had a kidney stone at that time so likely due to obstruction.  We will check kidney function with intake labs.  Patient also advised to avoid nephrotoxins increase water intake and work on blood pressure control.    Obesity Treatment / Action Plan:  Patient will work on garnering support from family and friends to begin weight loss journey. Will work on eliminating or reducing the presence of highly palatable, calorie dense foods in the home. Will complete provided nutritional and psychosocial assessment questionnaire before the next appointment. Will be scheduled for indirect calorimetry to determine resting energy expenditure in a fasting state.  This will allow [place] to create a reduced calorie, high-protein meal plan to promote loss of fat mass while preserving muscle mass. Counseled on the health benefits of losing 5%-15% of total body weight. Was counseled on nutritional approaches to weight loss and benefits of complex carbs and high quality protein as part of nutritional weight management. Was counseled on pharmacotherapy and role as an adjunct in weight management.  Will work on increasing water intake with a goal of 125 ounces for men and 91 ounces for women.   Obesity Education Performed Today:  She was weighed on the bioimpedance scale and results were discussed and documented in the synopsis.  We discussed obesity as a disease and the importance of a more detailed evaluation of all the factors contributing to the disease.  We discussed the importance of long term lifestyle changes which include nutrition, exercise and behavioral modifications as well as the importance of customizing this to her specific health and social needs.  We discussed the benefits of reaching a healthier weight to alleviate the symptoms of  existing conditions and reduce the risks of the biomechanical, metabolic and psychological effects of obesity.  Ashlee Gordon appears to be in the action stage of change and states they are ready to start intensive lifestyle modifications and behavioral modifications.  30 minutes was spent today on this visit including the above counseling, pre-visit chart review, and post-visit documentation.  Reviewed by clinician on day of visit: allergies, medications, problem list, medical history, surgical history, family history, social history, and previous encounter notes.    I have reviewed the above documentation for accuracy and completeness, and I agree with the above.  Thomes Dinning, MD

## 2022-11-22 ENCOUNTER — Encounter (INDEPENDENT_AMBULATORY_CARE_PROVIDER_SITE_OTHER): Payer: Self-pay | Admitting: Internal Medicine

## 2022-11-22 ENCOUNTER — Ambulatory Visit (INDEPENDENT_AMBULATORY_CARE_PROVIDER_SITE_OTHER): Payer: Medicaid Other | Admitting: Internal Medicine

## 2022-11-22 VITALS — BP 172/109 | HR 97 | Temp 97.8°F | Ht 68.0 in | Wt >= 6400 oz

## 2022-11-22 DIAGNOSIS — I10 Essential (primary) hypertension: Secondary | ICD-10-CM | POA: Diagnosis not present

## 2022-11-22 DIAGNOSIS — R7303 Prediabetes: Secondary | ICD-10-CM

## 2022-11-22 DIAGNOSIS — R5383 Other fatigue: Secondary | ICD-10-CM | POA: Diagnosis not present

## 2022-11-22 DIAGNOSIS — R0602 Shortness of breath: Secondary | ICD-10-CM | POA: Insufficient documentation

## 2022-11-22 DIAGNOSIS — Z903 Acquired absence of stomach [part of]: Secondary | ICD-10-CM | POA: Insufficient documentation

## 2022-11-22 DIAGNOSIS — Z1331 Encounter for screening for depression: Secondary | ICD-10-CM | POA: Insufficient documentation

## 2022-11-22 DIAGNOSIS — G4733 Obstructive sleep apnea (adult) (pediatric): Secondary | ICD-10-CM

## 2022-11-22 DIAGNOSIS — Z6841 Body Mass Index (BMI) 40.0 and over, adult: Secondary | ICD-10-CM

## 2022-11-22 NOTE — Assessment & Plan Note (Addendum)
Patient has had multiple repeated blood pressure measurements that have been high.  Blood pressure is uncontrolled.  She has been counseled on the risks associated with uncontrolled blood pressure.  She is not on blood pressure medications and is also on Wellbutrin which may be compounding the problem.  She was seen by her PCP but has not been started on medications yet.  She will likely need minimally to agents.  She has a follow-up with them tomorrow.  I counseled on the importance of getting her blood pressure under control.  I advised patient to not take bupropion today.  We are going to check renal parameters and TSH today.

## 2022-11-22 NOTE — Assessment & Plan Note (Addendum)
Based on history.  Unable to look at labs from [facility].  We will check a fasting blood sugar, hemoglobin A1c and insulin levels today. Patient informed of disease state and risk of progression. This may contribute to abnormal cravings, fatigue and diabetes complications without having diabetes.   We reviewed treatment options which include weight loss of about 7 to 10% of body weight, increasing physical activity to 150 minutes a week of moderate intensity.  She may also be a candidate for pharmacotherapy.  Follow-up on test results

## 2022-11-22 NOTE — Assessment & Plan Note (Addendum)
Untreated, Epworth of 5.  Counseled on risk associated with untreated sleep apnea.  May benefit from a repeat sleep study following weight loss due to gastric sleeve surgery.  Losing 15% of body weight may improve condition.  Patient advised to review this with primary care team.

## 2022-11-22 NOTE — Progress Notes (Unsigned)
Chief Complaint:   OBESITY Ashlee Gordon (MR# [id number]) is a 52 y.o. female who presents for evaluation and treatment of obesity and related comorbidities. Current BMI is Body mass index is 64.01 kg/m. Ashlee Gordon has been struggling with her weight for many years and has been unsuccessful in either losing weight, maintaining weight loss, or reaching her healthy weight goal.  Ashlee Gordon is currently in the action stage of change and ready to dedicate time achieving and maintaining a healthier weight. Ashlee Gordon is interested in becoming our patient and working on intensive lifestyle modifications including (but not limited to) diet and exercise for weight loss.  Ashlee Gordon's habits were reviewed today and are as follows: Her family eats meals together, she thinks her family will eat healthier with her, her desired weight loss is 101-136 lbs, she has been heavy most of her life, she started gaining weight in 2010, her heaviest weight ever was 534 pounds, she has significant food cravings issues, she snacks frequently in the evenings, she skips meals frequently, she is frequently drinking liquids with calories, she has problems with excessive hunger, she frequently eats larger portions than normal, and she struggles with emotional eating.  Depression Screen Ashlee Gordon's Food and Mood (modified PHQ-9) score was 7.  Subjective:   1. Other fatigue Crystallee admits to daytime somnolence and denies waking up still tired. Patient has a history of symptoms of daytime fatigue. Ashlee Gordon generally gets 6 or 8 hours of sleep per night, and states that she has generally restful sleep. Snoring is present. Apneic episodes are not present. Epworth Sleepiness Score is 5.   2. SOBOE (shortness of breath on exertion) Ashlee Gordon notes increasing shortness of breath with exercising and seems to be worsening over time with weight gain. She notes getting out of breath sooner with activity than she used to. This has not gotten worse  recently. Ashlee Gordon denies shortness of breath at rest or orthopnea.  3. OSA (obstructive sleep apnea) Untreated, Epworth of 5.  Counseled on risk associated with untreated sleep apnea.  May benefit from a repeat sleep study following weight loss due to gastric sleeve surgery.    4. Prediabetes Based on history.  Unable to look at labs from [facility].  We will check a fasting blood sugar, hemoglobin A1c and insulin levels today. Patient informed of disease state and risk of progression. This may contribute to abnormal cravings, fatigue and diabetes complications without having diabetes.   5. Primary hypertension Patient has had multiple repeated blood pressure measurements that have been high.  Blood pressure is uncontrolled.  She has been counseled on the risks associated with uncontrolled blood pressure.  She is not on blood pressure medications and is also on Wellbutrin which may be compounding the problem.  She was seen by her PCP but has not been started on medications yet.    6. H/O gastric sleeve Patient is likely experiencing some metabolic adaptations.  There are no signs or symptoms of complications.    Assessment/Plan:   1. Other fatigue Ashlee Gordon does feel that her weight is causing her energy to be lower than it should be. Fatigue may be related to obesity, depression or many other causes. Labs will be ordered, and in the meanwhile, Ashlee Gordon will focus on self care including making healthy food choices, increasing physical activity and focusing on stress reduction.  - EKG 12-Lead - Vitamin B12  2. SOBOE (shortness of breath on exertion) Ashlee Gordon does feel that she gets out of breath more  easily that she used to when she exercises. Ashlee Gordon's shortness of breath appears to be obesity related and exercise induced. She has agreed to work on weight loss and gradually increase exercise to treat her exercise induced shortness of breath. Will continue to monitor closely.  - CBC With  Differential - CMP14+EGFR  3. OSA (obstructive sleep apnea) Losing 15% of body weight may improve condition.  Patient advised to review this with primary care team.  4. Prediabetes We reviewed treatment options which include weight loss of about 7 to 10% of body weight, increasing physical activity to 150 minutes a week of moderate intensity.  She may also be a candidate for pharmacotherapy.  Follow-up on test results.  5. Primary hypertension She will likely need minimally to agents.  She has a follow-up with them tomorrow.  I counseled on the importance of getting her blood pressure under control.  I advised patient to not take bupropion today.  We are going to check renal parameters and TSH today.  - Lipid Panel With LDL/HDL Ratio - TSH  6. H/O gastric sleeve We will work on nutrition, behavioral and physical activity.  She may also be a candidate for pharmacotherapy as an adjunct.  It was conveyed to her that because of these adaptations it can be a little bit harder for her to lose weight.  7. Depression screening Ashlee Gordon had a positive depression screening. Depression is commonly associated with obesity and often results in emotional eating behaviors. We will monitor this closely and work on CBT to help improve the non-hunger eating patterns. Referral to Psychology may be required if no improvement is seen as she continues in our clinic.  8. Class 3 severe obesity with serious comorbidity and body mass index (BMI) of 60.0 to 69.9 in adult, unspecified obesity type ([place]) We will check labs today.   - Hemoglobin A1c - Insulin, random - VITAMIN D 25 Hydroxy (Vit-D Deficiency, Fractures)  Ashlee Gordon is currently in the action stage of change and her goal is to continue with weight loss efforts. I recommend Ashlee Gordon begin the structured treatment plan as follows:  She has agreed to the Category 4 Plan + 200 calories.  Exercise goals: No exercise has been prescribed at this time.    Behavioral modification strategies: increasing lean protein intake, decreasing simple carbohydrates, increasing vegetables, increasing water intake, decreasing liquid calories, decreasing sodium intake, no skipping meals, meal planning and cooking strategies, keeping healthy foods in the home, better snacking choices, avoiding temptations, and planning for success.  She was informed of the importance of frequent follow-up visits to maximize her success with intensive lifestyle modifications for her multiple health conditions. She was informed we would discuss her lab results at her next visit unless there is a critical issue that needs to be addressed sooner. Ashlee Gordon agreed to keep her next visit at the agreed upon time to discuss these results.  Objective:   Blood pressure (!) 172/109, pulse 97, temperature 97.8 F (36.6 C), height 5' 8"$  (1.727 m), weight (!) 421 lb (191 kg), SpO2 98 %. Body mass index is 64.01 kg/m.  EKG: Normal sinus rhythm, rate (unable to obtain).  Indirect Calorimeter completed today shows a VO2 of 449 and a REE of 3096.  Her calculated basal metabolic rate is 123XX123 thus her basal metabolic rate is better than expected.  General: Cooperative, alert, well developed, in no acute distress. HEENT: Conjunctivae and lids unremarkable. Cardiovascular: Regular rhythm.  Lungs: Normal work of breathing. Neurologic: No focal  deficits.   Lab Results  Component Value Date   CREATININE 0.92 11/22/2022   BUN 15 11/22/2022   NA 141 11/22/2022   K 4.1 11/22/2022   CL 101 11/22/2022   CO2 23 11/22/2022   Lab Results  Component Value Date   ALT 14 11/22/2022   AST 16 11/22/2022   ALKPHOS 86 11/22/2022   BILITOT 0.2 11/22/2022   Lab Results  Component Value Date   HGBA1C 6.1 (H) 11/22/2022   Lab Results  Component Value Date   INSULIN 37.9 (H) 11/22/2022   Lab Results  Component Value Date   TSH 1.220 11/22/2022   Lab Results  Component Value Date   CHOL 185  11/22/2022   HDL 54 11/22/2022   LDLCALC 117 (H) 11/22/2022   TRIG 74 11/22/2022   Lab Results  Component Value Date   WBC 13.9 (H) 11/22/2022   HGB 12.8 11/22/2022   HCT 41.0 11/22/2022   MCV 86 11/22/2022   PLT 183 08/08/2022   No results found for: "IRON", "TIBC", "FERRITIN"  Attestation Statements:   Reviewed by clinician on day of visit: allergies, medications, problem list, medical history, surgical history, family history, social history, and previous encounter notes.  Time spent on visit including pre-visit chart review and post-visit charting and care was 40 minutes.   Wilhemena Durie, am acting as transcriptionist for Thomes Dinning, MD.  I have reviewed the above documentation for accuracy and completeness, and I agree with the above. -Thomes Dinning, MD

## 2022-11-22 NOTE — Assessment & Plan Note (Addendum)
Patient is likely experiencing some metabolic adaptations.  There are no signs or symptoms of complications.  We will work on nutrition, behavioral and physical activity.  She may also be a candidate for pharmacotherapy as an adjunct.  It was conveyed to her that because of these adaptations it can be a little bit harder for her to lose weight.

## 2022-11-23 ENCOUNTER — Telehealth (INDEPENDENT_AMBULATORY_CARE_PROVIDER_SITE_OTHER): Payer: Self-pay | Admitting: Internal Medicine

## 2022-11-23 LAB — CBC WITH DIFFERENTIAL
Basophils Absolute: 0 10*3/uL (ref 0.0–0.2)
Basos: 0 %
EOS (ABSOLUTE): 0 10*3/uL (ref 0.0–0.4)
Eos: 0 %
Hematocrit: 41 % (ref 34.0–46.6)
Hemoglobin: 12.8 g/dL (ref 11.1–15.9)
Immature Grans (Abs): 0.1 10*3/uL (ref 0.0–0.1)
Immature Granulocytes: 1 %
Lymphocytes Absolute: 2.6 10*3/uL (ref 0.7–3.1)
Lymphs: 19 %
MCH: 26.8 pg (ref 26.6–33.0)
MCHC: 31.2 g/dL — ABNORMAL LOW (ref 31.5–35.7)
MCV: 86 fL (ref 79–97)
Monocytes Absolute: 0.5 10*3/uL (ref 0.1–0.9)
Monocytes: 3 %
Neutrophils Absolute: 10.8 10*3/uL — ABNORMAL HIGH (ref 1.4–7.0)
Neutrophils: 77 %
RBC: 4.78 x10E6/uL (ref 3.77–5.28)
RDW: 13.8 % (ref 11.7–15.4)
WBC: 13.9 10*3/uL — ABNORMAL HIGH (ref 3.4–10.8)

## 2022-11-23 LAB — CMP14+EGFR
ALT: 14 IU/L (ref 0–32)
AST: 16 IU/L (ref 0–40)
Albumin/Globulin Ratio: 1.1 — ABNORMAL LOW (ref 1.2–2.2)
Albumin: 4 g/dL (ref 3.8–4.9)
Alkaline Phosphatase: 86 IU/L (ref 44–121)
BUN/Creatinine Ratio: 16 (ref 9–23)
BUN: 15 mg/dL (ref 6–24)
Bilirubin Total: 0.2 mg/dL (ref 0.0–1.2)
CO2: 23 mmol/L (ref 20–29)
Calcium: 9 mg/dL (ref 8.7–10.2)
Chloride: 101 mmol/L (ref 96–106)
Creatinine, Ser: 0.92 mg/dL (ref 0.57–1.00)
Globulin, Total: 3.5 g/dL (ref 1.5–4.5)
Glucose: 94 mg/dL (ref 70–99)
Potassium: 4.1 mmol/L (ref 3.5–5.2)
Sodium: 141 mmol/L (ref 134–144)
Total Protein: 7.5 g/dL (ref 6.0–8.5)
eGFR: 75 mL/min/{1.73_m2} (ref >59)

## 2022-11-23 LAB — LIPID PANEL WITH LDL/HDL RATIO
Cholesterol, Total: 185 mg/dL (ref 100–199)
HDL: 54 mg/dL (ref >39)
LDL Chol Calc (NIH): 117 mg/dL — ABNORMAL HIGH (ref 0–99)
LDL/HDL Ratio: 2.2 ratio (ref 0.0–3.2)
Triglycerides: 74 mg/dL (ref 0–149)
VLDL Cholesterol Cal: 14 mg/dL (ref 5–40)

## 2022-11-23 LAB — VITAMIN D 25 HYDROXY (VIT D DEFICIENCY, FRACTURES): Vit D, 25-Hydroxy: 13.9 ng/mL — ABNORMAL LOW (ref 30.0–100.0)

## 2022-11-23 LAB — VITAMIN B12: Vitamin B-12: >2000 pg/mL — ABNORMAL HIGH (ref 232–1245)

## 2022-11-23 LAB — HEMOGLOBIN A1C
Est. average glucose Bld gHb Est-mCnc: 128 mg/dL
Hgb A1c MFr Bld: 6.1 % — ABNORMAL HIGH (ref 4.8–5.6)

## 2022-11-23 LAB — INSULIN, RANDOM: INSULIN: 37.9 u[IU]/mL — ABNORMAL HIGH (ref 2.6–24.9)

## 2022-11-23 LAB — TSH: TSH: 1.22 u[IU]/mL (ref 0.450–4.500)

## 2022-11-23 NOTE — Telephone Encounter (Signed)
Patient called stating that she was returning a call from our office. Pt stated that the steroid she received an injection of, is listed on her medication list from her new patient appointment.

## 2022-11-23 NOTE — Telephone Encounter (Signed)
I spoke to pt

## 2022-11-23 NOTE — Progress Notes (Signed)
Pt reports no symptoms of a bladder infection, cough, or fever; but does report having a steroid injection on Monday (11/21/2021). She had her PCP visit today and lab work was done (unsure of what labs). Pt was advised to follow up with her PCP if any of the above mentioned symptoms should arise.

## 2022-12-06 ENCOUNTER — Encounter (INDEPENDENT_AMBULATORY_CARE_PROVIDER_SITE_OTHER): Payer: Self-pay | Admitting: Internal Medicine

## 2022-12-06 ENCOUNTER — Ambulatory Visit (INDEPENDENT_AMBULATORY_CARE_PROVIDER_SITE_OTHER): Payer: Medicaid Other | Admitting: Internal Medicine

## 2022-12-06 VITALS — BP 150/92 | HR 92 | Temp 98.1°F | Ht 68.0 in

## 2022-12-06 DIAGNOSIS — I10 Essential (primary) hypertension: Secondary | ICD-10-CM

## 2022-12-06 DIAGNOSIS — Z6841 Body Mass Index (BMI) 40.0 and over, adult: Secondary | ICD-10-CM

## 2022-12-06 DIAGNOSIS — R7303 Prediabetes: Secondary | ICD-10-CM | POA: Diagnosis not present

## 2022-12-06 DIAGNOSIS — Z903 Acquired absence of stomach [part of]: Secondary | ICD-10-CM

## 2022-12-06 DIAGNOSIS — E559 Vitamin D deficiency, unspecified: Secondary | ICD-10-CM

## 2022-12-06 MED ORDER — ERGOCALCIFEROL 1.25 MG (50000 UT) PO CAPS: 50000.0000 [IU] | ORAL_CAPSULE | ORAL | 0 refills | Status: DC

## 2022-12-06 NOTE — Assessment & Plan Note (Signed)
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  She will continue to work on increasing frequency of her meals and meeting the 30 g per meal threshold for protein synthesis and lipolysis.

## 2022-12-06 NOTE — Assessment & Plan Note (Signed)
Most recent A1c is  Lab Results  Component Value Date   HGBA1C 6.1 (H) 11/22/2022   with associated elevated insulin levels. Her HOMA-IR is 8.7 which is markedly elevated.  Patient informed of disease state and risk of progression. This may contribute to abnormal cravings, fatigue and diabetes complications without having diabetes.   We reviewed treatment options which include weight loss of about 7 to 10% of body weight, increasing physical activity to 150 minutes a week of moderate intensity and pharmacotherapy with metformin and or incretin mimetic.  She declines pharmacotherapy at present time.

## 2022-12-06 NOTE — Progress Notes (Signed)
Office: [phone]  /  Fax: [phone]  WEIGHT SUMMARY AND BIOMETRICS  Medical Weight Loss Height: 5' 8"$  (1.727 m) Weight: 415 lb (188.2 kg) Temp: 98.1 F (36.7 C) Pulse Rate: 92 BP: (!) 150/92 SpO2: 100 % Fasting: n Labs: n Today's Visit #: 2 Weight at Last VIsit: 421 ln Weight Lost Since Last Visit: 6 lb  Body Fat %: 61.9 % Fat Mass (lbs): 257 lbs Muscle Mass (lbs): 150.2 lbs Visceral Fat Rating : 28 Peak Weight: 546 lb Starting Date: 11/22/22 Starting Weight: 421 lb Total Weight Loss (lbs): 6 lb (2.722 kg)    HPI  Chief Complaint: OBESITY  Ashlee Gordon is here to discuss her progress with her obesity treatment plan. She is on the keeping a food journal and adhering to recommended goals of 2000 calories and to increase protein protein and states she is following her eating plan approximately 25 % of the time. She states she is exercising 15 minutes 3 times per week.   Interval History:  Since last office visit she working on increasing meals from one a day , to two a day. Increasing water and decreasing eating out.  She was also seen by her PCP and has been started on blood pressure medications which are being adjusted.  She notes adequate satiety and satiation.  She denies abnormal cravings or problems with portion control.   Pharmacotherapy: declined  PHYSICAL EXAM:  Blood pressure (!) 150/92, pulse 92, temperature 98.1 F (36.7 C), height 5' 8"$  (1.727 m), SpO2 100 %. Body mass index is 64.01 kg/m.  General: She is overweight, cooperative, alert, well developed, and in no acute distress. PSYCH: Has normal mood, affect and thought process.   HEENT: EOMI, sclerae are anicteric. Lungs: Normal breathing effort, no conversational dyspnea. Extremities: No edema.  Neurologic: No gross sensory or motor deficits. No tremors or fasciculations noted.    DIAGNOSTIC DATA REVIEWED:  BMET    Component Value Date/Time   NA 141 11/22/2022 1016   K 4.1 11/22/2022 1016    CL 101 11/22/2022 1016   CO2 23 11/22/2022 1016   GLUCOSE 94 11/22/2022 1016   GLUCOSE 110 (H) 08/08/2022 1805   BUN 15 11/22/2022 1016   CREATININE 0.92 11/22/2022 1016   CALCIUM 9.0 11/22/2022 1016   GFRNONAA 36 (L) 08/08/2022 1805   Lab Results  Component Value Date   HGBA1C 6.1 (H) 11/22/2022   Lab Results  Component Value Date   INSULIN 37.9 (H) 11/22/2022   Lab Results  Component Value Date   TSH 1.220 11/22/2022   CBC    Component Value Date/Time   WBC 13.9 (H) 11/22/2022 1016   WBC 9.1 08/08/2022 1805   RBC 4.78 11/22/2022 1016   RBC 4.47 08/08/2022 1805   HGB 12.8 11/22/2022 1016   HCT 41.0 11/22/2022 1016   PLT 183 08/08/2022 1805   MCV 86 11/22/2022 1016   MCH 26.8 11/22/2022 1016   MCH 27.5 08/08/2022 1805   MCHC 31.2 (L) 11/22/2022 1016   MCHC 32.1 08/08/2022 1805   RDW 13.8 11/22/2022 1016   Iron Studies No results found for: "IRON", "TIBC", "FERRITIN", "IRONPCTSAT" Lipid Panel     Component Value Date/Time   CHOL 185 11/22/2022 1016   TRIG 74 11/22/2022 1016   HDL 54 11/22/2022 1016   LDLCALC 117 (H) 11/22/2022 1016   Hepatic Function Panel     Component Value Date/Time   PROT 7.5 11/22/2022 1016   ALBUMIN 4.0 11/22/2022 1016   AST  16 11/22/2022 1016   ALT 14 11/22/2022 1016   ALKPHOS 86 11/22/2022 1016   BILITOT 0.2 11/22/2022 1016      Component Value Date/Time   TSH 1.220 11/22/2022 1016   Nutritional Lab Results  Component Value Date   VD25OH 13.9 (L) 11/22/2022     ASSESSMENT AND PLAN  TREATMENT PLAN FOR OBESITY:  Recommended Dietary Goals  Ashlee Gordon is currently in the action stage of change. As such, her goal is to continue weight management plan. She has agreed to the Category 4 Plan +200 cal  Behavioral Intervention  We discussed the following Behavioral Modification Strategies today: increasing lean protein intake, decreasing simple carbohydrates , increasing vegetables, increasing lower sugar fruits, avoid  skipping meals, increase water intake, work on meal planning and easy cooking plans, think about ways to increase physical activity, reading food labels and making healthy choices when eating convenient foods, and avoiding temptations.  Additional resources provided today: NA  Recommended Physical Activity Goals  Ashlee Gordon has been advised to work up to 150 minutes of moderate intensity aerobic activity a week and strengthening exercises 2-3 times per week for cardiovascular health, weight loss maintenance and preservation of muscle mass.   She has agreed to increase physical activity in their day and reduce sedentary time (increase NEAT).    Pharmacotherapy We discussed various medication options to help Ashlee Gordon with her weight loss efforts and we both agreed to continue .  ASSOCIATED CONDITIONS ADDRESSED TODAY  Prediabetes Assessment & Plan: Most recent A1c is  Lab Results  Component Value Date   HGBA1C 6.1 (H) 11/22/2022   with associated elevated insulin levels. Her HOMA-IR is 8.7 which is markedly elevated.  Patient informed of disease state and risk of progression. This may contribute to abnormal cravings, fatigue and diabetes complications without having diabetes.   We reviewed treatment options which include weight loss of about 7 to 10% of body weight, increasing physical activity to 150 minutes a week of moderate intensity and pharmacotherapy with metformin and or incretin mimetic.  She declines pharmacotherapy at present time.      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  She will continue to work on increasing frequency of her meals and meeting the 30 g per meal threshold for protein synthesis and lipolysis.   Class 3 severe obesity with serious comorbidity and body mass index (BMI) of 60.0 to 69.9 in adult, unspecified obesity type (Ashlee Gordon)  Primary hypertension Assessment & Plan: Improving but still  not at goal.  She is now on amlodipine.  She is working closely with her PCP and they are adjusting blood pressure medications.  We reviewed goals of care which is a blood pressure of less than 120/80.  Reviewed most recent renal parameters and they are within normal limits.   Vitamin D deficiency Assessment & Plan: Most recent vitamin D levels  Lab Results  Component Value Date   VD25OH 13.9 (L) 11/22/2022     Deficiency state associated with adiposity and may result in leptin resistance, weight gain and fatigue.   Plan: After discussion of benefits and side effects she is agreeable to starting ergocalciferol 50,000 units 1 tablet weekly for 3 months.  We will check levels at that time for goal level between 50 and 60.   Orders: -     Ergocalciferol; Take 1 capsule (50,000 Units total) by mouth once a week.  Dispense: 12 capsule; Refill: 0  Return in about 2 weeks (around 12/20/2022) for For Weight Mangement with Dr. Gerarda Fraction.Marland Kitchen She was informed of the importance of frequent follow up visits to maximize her success with intensive lifestyle modifications for her multiple health conditions.   ATTESTASTION STATEMENTS:  Reviewed by clinician on day of visit: allergies, medications, problem list, medical history, surgical history, family history, social history, and previous encounter notes.   Time spent on visit including pre-visit chart review and post-visit care and charting was 30 minutes.    Thomes Dinning, MD

## 2022-12-06 NOTE — Assessment & Plan Note (Signed)
Most recent vitamin D levels  Lab Results  Component Value Date   VD25OH 13.9 (L) 11/22/2022     Deficiency state associated with adiposity and may result in leptin resistance, weight gain and fatigue.   Plan: After discussion of benefits and side effects she is agreeable to starting ergocalciferol 50,000 units 1 tablet weekly for 3 months.  We will check levels at that time for goal level between 50 and 60.

## 2022-12-06 NOTE — Assessment & Plan Note (Signed)
Improving but still not at goal.  She is now on amlodipine.  She is working closely with her PCP and they are adjusting blood pressure medications.  We reviewed goals of care which is a blood pressure of less than 120/80.  Reviewed most recent renal parameters and they are within normal limits.

## 2022-12-20 ENCOUNTER — Encounter (INDEPENDENT_AMBULATORY_CARE_PROVIDER_SITE_OTHER): Payer: Self-pay | Admitting: Internal Medicine

## 2022-12-20 NOTE — Telephone Encounter (Signed)
Please review

## 2022-12-22 ENCOUNTER — Ambulatory Visit (INDEPENDENT_AMBULATORY_CARE_PROVIDER_SITE_OTHER): Payer: Medicaid Other | Admitting: Internal Medicine

## 2023-01-05 ENCOUNTER — Encounter (INDEPENDENT_AMBULATORY_CARE_PROVIDER_SITE_OTHER): Payer: Self-pay | Admitting: Internal Medicine

## 2023-01-05 ENCOUNTER — Ambulatory Visit (INDEPENDENT_AMBULATORY_CARE_PROVIDER_SITE_OTHER): Payer: Medicaid Other | Admitting: Internal Medicine

## 2023-01-05 VITALS — BP 140/88 | HR 108 | Temp 98.4°F | Ht 68.0 in | Wt >= 6400 oz

## 2023-01-05 DIAGNOSIS — R7303 Prediabetes: Secondary | ICD-10-CM

## 2023-01-05 DIAGNOSIS — E559 Vitamin D deficiency, unspecified: Secondary | ICD-10-CM

## 2023-01-05 DIAGNOSIS — I10 Essential (primary) hypertension: Secondary | ICD-10-CM | POA: Diagnosis not present

## 2023-01-05 DIAGNOSIS — Z6841 Body Mass Index (BMI) 40.0 and over, adult: Secondary | ICD-10-CM

## 2023-01-05 NOTE — Assessment & Plan Note (Signed)
Blood pressure is improving but still not at goal.  She is now on amlodipine 10 mg a day and hydrochlorothiazide 25 mg daily she will need probably around 3-4 agents.  She will continue to work with primary care.  I recommend she check her blood pressure in the morning and also at bedtime to rule out nondipping.  She may need a twice a day regimen.

## 2023-01-05 NOTE — Assessment & Plan Note (Signed)
Most recent A1c is  Lab Results  Component Value Date   HGBA1C 6.1 (H) 11/22/2022  . Patient informed of disease state and risk of progression. This may contribute to abnormal cravings, fatigue and diabetes complications without having diabetes.   She declines again pharmacotherapy.  Her insurance does not cover GLP-1 drugs for weight management.  Continue with nutritional and behavioral approaches to weight loss

## 2023-01-05 NOTE — Assessment & Plan Note (Signed)
Most recent vitamin D levels  Lab Results  Component Value Date   VD25OH 13.9 (L) 11/22/2022     Deficiency state associated with adiposity and may result in leptin resistance, weight gain and fatigue. Currently on vitamin D supplementation without any adverse effects.  Plan: Continue high-dose vitamin D supplementation.  Check levels in April.

## 2023-01-05 NOTE — Assessment & Plan Note (Signed)
With history of gastric sleeve and metabolic adaptations
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
## 2023-01-18 ENCOUNTER — Ambulatory Visit (INDEPENDENT_AMBULATORY_CARE_PROVIDER_SITE_OTHER): Payer: Medicaid Other | Admitting: Internal Medicine

## 2023-02-06 ENCOUNTER — Encounter (INDEPENDENT_AMBULATORY_CARE_PROVIDER_SITE_OTHER): Payer: Self-pay | Admitting: Internal Medicine

## 2023-02-06 ENCOUNTER — Ambulatory Visit (INDEPENDENT_AMBULATORY_CARE_PROVIDER_SITE_OTHER): Payer: Medicaid Other | Admitting: Internal Medicine

## 2023-02-06 VITALS — BP 148/91 | HR 95 | Temp 98.4°F | Ht 68.0 in | Wt >= 6400 oz

## 2023-02-06 DIAGNOSIS — Z903 Acquired absence of stomach [part of]: Secondary | ICD-10-CM | POA: Diagnosis not present

## 2023-02-06 DIAGNOSIS — R7303 Prediabetes: Secondary | ICD-10-CM | POA: Diagnosis not present

## 2023-02-06 DIAGNOSIS — Z6841 Body Mass Index (BMI) 40.0 and over, adult: Secondary | ICD-10-CM

## 2023-02-06 MED ORDER — METFORMIN HCL ER 500 MG PO TB24: 500.0000 mg | ORAL_TABLET | Freq: Two times a day (BID) | ORAL | 0 refills | Status: DC

## 2023-02-06 NOTE — Assessment & Plan Note (Signed)
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  We had to talk about expectations and also although difficulties on losing weight.  She has a number of barriers.  We will continue with nutritional and behavioral strategies and patient education.

## 2023-02-06 NOTE — Assessment & Plan Note (Signed)
Most recent A1c is  Lab Results  Component Value Date   HGBA1C 6.1 (H) 11/22/2022  . Patient informed of disease state and risk of progression. This may contribute to abnormal cravings, fatigue and diabetes complications without having diabetes.   We discussed treatment options.  Losing 7 to 10% of body weight may improve condition.  After discussion of benefits and side effect she is agreeable to starting metformin.  This will be started at 500 mg 1 tablet daily for 7 days and then twice a day with food.  She is also a candidate for incretin therapy but this is likely not covered by her insurance.

## 2023-02-06 NOTE — Progress Notes (Signed)
Office: [phone]  /  Fax: [phone]  WEIGHT SUMMARY AND BIOMETRICS  Vitals Temp: 98.4 F (36.9 C) BP: (!) 148/91 Pulse Rate: 95 SpO2: 100 %   Anthropometric Measurements Height:  (1.727 m) Weight: (!) 426 lb (193.2 kg) BMI (Calculated): 64.79 Weight at Last Visit: 415 lb Starting Weight: 421 lb Peak Weight: 546 lb   Body Composition  Body Fat %: 65.1 % Fat Mass (lbs): 277.6 lbs Muscle Mass (lbs): 41.4 lbs Visceral Fat Rating : 31    Starting Date: 11/22/22  Today's Visit #: 4  No data recorded  HPI  Chief Complaint: OBESITY  Ashlee Gordon is here to discuss her progress with her obesity treatment plan. She is on the the Category 4 Plan  +200 calories and states she is following her eating plan approximately 30 % of the time. She states she is exercising 150 minutes 1 times per week.  Interval History:  Since last office visit she has gained 11 lbs. She reports suboptimal adherence due to difficulty because of trying to make too many changes.  She has been working on not skipping meals, eating more fruits, eating more vegetables, drinking more water, working on gradual implementation of reduced calorie nutrition plan, and thinking of starting to exercise Denies problems with appetite and hunger signals.  Denies problems with satiety and satiation.  Reports problems with eating patterns and portion control.  Reports abnormal cravings. Denies feeling deprived or restricted.     Barriers identified: having difficulty preparing healthy meals, having difficulty with meal prep and planning, inability to focus on healthy eating, exposure to enticing environments and or relationships, lack of access to safe recreation facilities or a fitness center, medical comorbidities, strong hunger signals and appetite, and difficulty implementing reduced calorie nutrition plan.   Pharmacotherapy for weight loss: She is currently taking no anti-obesity medication.     ASSESSMENT AND PLAN  TREATMENT PLAN FOR OBESITY:  Recommended Dietary Goals  Ashlee Gordon is currently in the action stage of change. As such, her goal is to continue weight management plan. She has agreed to: continue current plan  Behavioral Intervention  We discussed the following Behavioral Modification Strategies today: increasing lean protein intake, decreasing simple carbohydrates , increasing vegetables, increasing lower glycemic fruits, increasing water intake, work on meal planning and preparation, work on tracking and journaling calories using tracking application, reading food labels , identifying sources and decreasing liquid calories, and decreasing eating out or consumption of processed foods, and making healthy choices when eating convenient foods.  Additional resources provided today: Handout on tracking and journaling using Apps  Recommended Physical Activity Goals  Ashlee Gordon has been advised to work up to 150 minutes of moderate intensity aerobic activity a week and strengthening exercises 2-3 times per week for cardiovascular health, weight loss maintenance and preservation of muscle mass.   She has agreed to :  Think about ways to increase physical activity  Pharmacotherapy We discussed various medication options to help Ashlee Gordon with her weight loss efforts and we both agreed to : continue with nutritional and behavioral strategies  ASSOCIATED CONDITIONS ADDRESSED TODAY  Prediabetes Assessment & Plan: Most recent A1c is  Lab Results  Component Value Date   HGBA1C 6.1 (H) 11/22/2022  . Patient informed of disease state and risk of progression. This may contribute to abnormal cravings, fatigue and diabetes complications without having diabetes.   We discussed treatment options.  Losing 7 to 10% of body weight may improve condition.  After discussion of benefits  and side effect she is agreeable to starting metformin.  This will be started at 500 mg 1 tablet daily for  7 days and then twice a day with food.  She is also a candidate for incretin therapy but this is likely not covered by her insurance.   Orders: -     metFORMIN HCl ER; Take 1 tablet (500 mg total) by mouth 2 (two) times daily with a meal.  Dispense: 60 tablet; Refill: 0  Class 3 severe obesity with serious comorbidity and body mass index (BMI) of 60.0 to 69.9 in adult, unspecified obesity type Assessment & Plan: With history of gastric sleeve and metabolic adaptations.  She is requesting medications which are not covered by her insurance.  She was again educated and counseled on the principles of comprehensive weight loss we talked about the benefits of tracking and journaling as she has a difficult time eating balanced.   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  We had to talk about expectations and also although difficulties on losing weight.  She has a number of barriers.  We will continue with nutritional and behavioral strategies and patient education.      PHYSICAL EXAM:  Blood pressure (!) 148/91, pulse 95, temperature 98.4 F (36.9 C), height 5\' 8"  (1.727 m), weight (!) 426 lb (193.2 kg), SpO2 100 %. Body mass index is 64.77 kg/m.  General: She is overweight, cooperative, alert, well developed, and in no acute distress. PSYCH: Has normal mood, affect and thought process.   HEENT: EOMI, sclerae are anicteric. Lungs: Normal breathing effort, no conversational dyspnea. Extremities: No edema.  Neurologic: No gross sensory or motor deficits. No tremors or fasciculations noted.    DIAGNOSTIC DATA REVIEWED:  BMET    Component Value Date/Time   NA 141 11/22/2022 1016   K 4.1 11/22/2022 1016   CL 101 11/22/2022 1016   CO2 23 11/22/2022 1016   GLUCOSE 94 11/22/2022 1016   GLUCOSE 110 (H) 08/08/2022 1805   BUN 15 11/22/2022 1016   CREATININE 0.92 11/22/2022 1016   CALCIUM 9.0 11/22/2022 1016   GFRNONAA  36 (L) 08/08/2022 1805   Lab Results  Component Value Date   HGBA1C 6.1 (H) 11/22/2022   Lab Results  Component Value Date   INSULIN 37.9 (H) 11/22/2022   Lab Results  Component Value Date   TSH 1.220 11/22/2022   CBC    Component Value Date/Time   WBC 13.9 (H) 11/22/2022 1016   WBC 9.1 08/08/2022 1805   RBC 4.78 11/22/2022 1016   RBC 4.47 08/08/2022 1805   HGB 12.8 11/22/2022 1016   HCT 41.0 11/22/2022 1016   PLT 183 08/08/2022 1805   MCV 86 11/22/2022 1016   MCH 26.8 11/22/2022 1016   MCH 27.5 08/08/2022 1805   MCHC 31.2 (L) 11/22/2022 1016   MCHC 32.1 08/08/2022 1805   RDW 13.8 11/22/2022 1016   Iron Studies No results found for: "IRON", "TIBC", "FERRITIN", "IRONPCTSAT" Lipid Panel     Component Value Date/Time   CHOL 185 11/22/2022 1016   TRIG 74 11/22/2022 1016   HDL 54 11/22/2022 1016   LDLCALC 117 (H) 11/22/2022 1016   Hepatic Function Panel     Component Value Date/Time   PROT 7.5 11/22/2022 1016   ALBUMIN 4.0 11/22/2022 1016   AST 16 11/22/2022 1016   ALT 14 11/22/2022 1016   ALKPHOS 86 11/22/2022 1016   BILITOT 0.2 11/22/2022 1016  Component Value Date/Time   TSH 1.220 11/22/2022 1016   Nutritional Lab Results  Component Value Date   VD25OH 13.9 (L) 11/22/2022     Return in about 3 weeks (around 02/27/2023) for For Weight Mangement with Dr. Rikki Spearing - 40 minutes .Marland Kitchen She was informed of the importance of frequent follow up visits to maximize her success with intensive lifestyle modifications for her multiple health conditions.   ATTESTASTION STATEMENTS:  Reviewed by clinician on day of visit: allergies, medications, problem list, medical history, surgical history, family history, social history, and previous encounter notes.     Worthy Rancher, MD

## 2023-02-06 NOTE — Assessment & Plan Note (Signed)
With history of gastric sleeve and metabolic adaptations.  She is requesting medications which are not covered by her insurance.  She was again educated and counseled on the principles of comprehensive weight loss we talked about the benefits of tracking and journaling as she has a difficult time eating balanced.

## 2023-02-27 ENCOUNTER — Encounter (INDEPENDENT_AMBULATORY_CARE_PROVIDER_SITE_OTHER): Payer: Self-pay | Admitting: Internal Medicine

## 2023-02-27 ENCOUNTER — Ambulatory Visit (INDEPENDENT_AMBULATORY_CARE_PROVIDER_SITE_OTHER): Payer: Medicaid Other | Admitting: Internal Medicine

## 2023-02-27 VITALS — BP 135/88 | HR 98 | Ht 68.0 in | Wt >= 6400 oz

## 2023-02-27 DIAGNOSIS — Z6841 Body Mass Index (BMI) 40.0 and over, adult: Secondary | ICD-10-CM

## 2023-02-27 DIAGNOSIS — R7303 Prediabetes: Secondary | ICD-10-CM | POA: Diagnosis not present

## 2023-02-27 DIAGNOSIS — I10 Essential (primary) hypertension: Secondary | ICD-10-CM

## 2023-02-27 DIAGNOSIS — E559 Vitamin D deficiency, unspecified: Secondary | ICD-10-CM

## 2023-02-27 DIAGNOSIS — Z903 Acquired absence of stomach [part of]: Secondary | ICD-10-CM

## 2023-02-27 MED ORDER — ERGOCALCIFEROL 1.25 MG (50000 UT) PO CAPS: 50000.0000 [IU] | ORAL_CAPSULE | ORAL | 0 refills | Status: DC

## 2023-02-27 MED ORDER — METFORMIN HCL ER 500 MG PO TB24: 500.0000 mg | ORAL_TABLET | Freq: Two times a day (BID) | ORAL | 0 refills | Status: DC

## 2023-02-27 NOTE — Assessment & Plan Note (Signed)
Blood pressure significantly improved.  She is now on amlodipine 10 mg a day and hydrochlorothiazide 25 mg daily.  Losing 10% of body weight may improve blood pressure control.  Continue to follow with PCP and monitoring blood pressures at home

## 2023-02-27 NOTE — Assessment & Plan Note (Signed)
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  We had to talk about expectations and also although difficulties on losing weight.  She has been overcoming a number of barriers.  We will continue with nutritional and behavioral strategies and patient education.

## 2023-02-27 NOTE — Progress Notes (Signed)
Office: [phone]  /  Fax: [phone]  WEIGHT SUMMARY AND BIOMETRICS  Vitals BP: 135/88 Pulse Rate: 98 SpO2: 100 %   Anthropometric Measurements Height: 5\' 8"  (1.727 m) Weight: (!) 418 lb (189.6 kg) BMI (Calculated): 63.57 Weight at Last Visit: 426 lb Weight Lost Since Last Visit: 8 lb Starting Weight: 421 lb Total Weight Loss (lbs): 3 lb (1.361 kg) Peak Weight: 546  lb   Body Composition  Body Fat %: 63.2 % Fat Mass (lbs): 264.6 lbs Muscle Mass (lbs): 146.2 lbs Visceral Fat Rating : 29    No data recorded Today's Visit #: 5  Starting Date: 11/22/22   HPI  Chief Complaint: OBESITY  Ashlee Gordon is here to discuss her progress with her obesity treatment plan. She is on the the weight watchers program and states she is following her eating plan approximately 75 % of the time. She states she is exercising 250  minutes 3 times per week.  Interval History:  Since last office visit she has lost 8 pounds. She reports fair adherence to reduced calorie nutritional plan.  She has been using weight watchers points for tracking purposes. She has been working on not skipping meals, increasing protein intake at every meal, eating more fruits, eating more vegetables, drinking more water, begun to exercise, reducing portion sizes, and reducing liquid calories.  She has also noticed an improvement in cravings for sweets since starting metformin.  She denies any adverse effects.  She enjoys to cook. Denies problems with appetite and hunger signals.  Denies problems with satiety and satiation.  Denies problems with eating patterns and portion control.  Denies abnormal cravings. Denies feeling deprived or restricted.   Barriers identified: none.   Pharmacotherapy for weight loss: She is currently taking Metformin (off label use for incretin effect and / or insulin resistance and / or diabetes prevention) .    ASSESSMENT AND PLAN  TREATMENT PLAN FOR OBESITY:  Recommended  Dietary Goals  Ashlee Gordon is currently in the action stage of change. As such, her goal is to continue weight management plan. She has agreed to: continue current plan.  Patient also provided with education regarding processed foods, gut microbiome and how to transition to a plant-based diet.  Behavioral Intervention  We discussed the following Behavioral Modification Strategies today: increasing lean protein intake, decreasing simple carbohydrates , increasing vegetables, increasing lower glycemic fruits, increasing water intake, work on meal planning and preparation, continue to practice mindfulness when eating, and planning for success.  Additional resources provided today: Handout on exercise goal setting  Recommended Physical Activity Goals  Ashlee Gordon has been advised to work up to 150 minutes of moderate intensity aerobic activity a week and strengthening exercises 2-3 times per week for cardiovascular health, weight loss maintenance and preservation of muscle mass.   She has agreed to :  Start strengthening exercises with a goal of 2-3 sessions a week   Pharmacotherapy We discussed various medication options to help Ashlee Gordon with her weight loss efforts and we both agreed to : continue current anti-obesity medication regimen  ASSOCIATED CONDITIONS ADDRESSED TODAY  Class 3 severe obesity with serious comorbidity and body mass index (BMI) of 60.0 to 69.9 in adult, unspecified obesity type (HCC)  Vitamin D deficiency Assessment & Plan: Most recent vitamin D levels  Lab Results  Component Value Date   VD25OH 13.9 (L) 11/22/2022     Deficiency state associated with adiposity and may result in leptin resistance, weight gain and fatigue. Currently on vitamin D  supplementation without any adverse effects.  Plan: Continue high-dose vitamin D supplementation.  Check levels in June.   Orders: -     Ergocalciferol; Take 1 capsule (50,000 Units total) by mouth once a week.  Dispense: 12  capsule; Refill: 0  Prediabetes Assessment & Plan: Most recent A1c is  Lab Results  Component Value Date   HGBA1C 6.1 (H) 11/22/2022  . Patient informed of disease state and risk of progression. This may contribute to abnormal cravings, fatigue and diabetes complications without having diabetes.   She is now on metformin 500 mg twice a day with excellent response.  She will continue medication.  We may consider increasing it to 2000 mg daily.  Continue with nutrition and behavioral strategies for diabetes prevention.   Orders: -     metFORMIN HCl ER; Take 1 tablet (500 mg total) by mouth 2 (two) times daily with a meal.  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  We had to talk about expectations and also although difficulties on losing weight.  She has been overcoming a number of barriers.  We will continue with nutritional and behavioral strategies and patient education.   Primary hypertension Assessment & Plan: Blood pressure significantly improved.  She is now on amlodipine 10 mg a day and hydrochlorothiazide 25 mg daily.  Losing 10% of body weight may improve blood pressure control.  Continue to follow with PCP and monitoring blood pressures at home     PHYSICAL EXAM:  Blood pressure 135/88, pulse 98, height 5\' 8"  (1.727 m), weight (!) 418 lb (189.6 kg), SpO2 100 %. Body mass index is 63.56 kg/m.  General: She is overweight, cooperative, alert, well developed, and in no acute distress. PSYCH: Has normal mood, affect and thought process.   HEENT: EOMI, sclerae are anicteric. Lungs: Normal breathing effort, no conversational dyspnea. Extremities: No edema.  Neurologic: No gross sensory or motor deficits. No tremors or fasciculations noted.    DIAGNOSTIC DATA REVIEWED:  BMET    Component Value Date/Time   NA 141 11/22/2022 1016   K 4.1 11/22/2022 1016   CL 101  11/22/2022 1016   CO2 23 11/22/2022 1016   GLUCOSE 94 11/22/2022 1016   GLUCOSE 110 (H) 08/08/2022 1805   BUN 15 11/22/2022 1016   CREATININE 0.92 11/22/2022 1016   CALCIUM 9.0 11/22/2022 1016   GFRNONAA 36 (L) 08/08/2022 1805   Lab Results  Component Value Date   HGBA1C 6.1 (H) 11/22/2022   Lab Results  Component Value Date   INSULIN 37.9 (H) 11/22/2022   Lab Results  Component Value Date   TSH 1.220 11/22/2022   CBC    Component Value Date/Time   WBC 13.9 (H) 11/22/2022 1016   WBC 9.1 08/08/2022 1805   RBC 4.78 11/22/2022 1016   RBC 4.47 08/08/2022 1805   HGB 12.8 11/22/2022 1016   HCT 41.0 11/22/2022 1016   PLT 183 08/08/2022 1805   MCV 86 11/22/2022 1016   MCH 26.8 11/22/2022 1016   MCH 27.5 08/08/2022 1805   MCHC 31.2 (L) 11/22/2022 1016   MCHC 32.1 08/08/2022 1805   RDW 13.8 11/22/2022 1016   Iron Studies No results found for: "IRON", "TIBC", "FERRITIN", "IRONPCTSAT" Lipid Panel     Component Value Date/Time   CHOL 185 11/22/2022 1016   TRIG 74 11/22/2022 1016   HDL 54 11/22/2022 1016   LDLCALC 117 (H) 11/22/2022 1016  Hepatic Function Panel     Component Value Date/Time   PROT 7.5 11/22/2022 1016   ALBUMIN 4.0 11/22/2022 1016   AST 16 11/22/2022 1016   ALT 14 11/22/2022 1016   ALKPHOS 86 11/22/2022 1016   BILITOT 0.2 11/22/2022 1016      Component Value Date/Time   TSH 1.220 11/22/2022 1016   Nutritional Lab Results  Component Value Date   VD25OH 13.9 (L) 11/22/2022     Return in about 2 weeks (around 03/13/2023) for For Weight Mangement with Dr. Rikki Spearing.Marland Kitchen She was informed of the importance of frequent follow up visits to maximize her success with intensive lifestyle modifications for her multiple health conditions.   ATTESTASTION STATEMENTS:  Reviewed by clinician on day of visit: allergies, medications, problem list, medical history, surgical history, family history, social history, and previous encounter notes.   I have spent 45  minutes in the care of the patient today including: preparing to see patient (e.g. review and interpretation of tests, old notes ), counseling and educating the patient, ordering medications, test and procedures, documenting clinical information in the electronic or other health care record, and care coordination   Worthy Rancher, MD

## 2023-02-27 NOTE — Assessment & Plan Note (Signed)
Most recent A1c is  Lab Results  Component Value Date   HGBA1C 6.1 (H) 11/22/2022  . Patient informed of disease state and risk of progression. This may contribute to abnormal cravings, fatigue and diabetes complications without having diabetes.   She is now on metformin 500 mg twice a day with excellent response.  She will continue medication.  We may consider increasing it to 2000 mg daily.  Continue with nutrition and behavioral strategies for diabetes prevention.

## 2023-02-27 NOTE — Assessment & Plan Note (Signed)
Most recent vitamin D levels  Lab Results  Component Value Date   VD25OH 13.9 (L) 11/22/2022     Deficiency state associated with adiposity and may result in leptin resistance, weight gain and fatigue. Currently on vitamin D supplementation without any adverse effects.  Plan: Continue high-dose vitamin D supplementation.  Check levels in June.

## 2023-03-07 ENCOUNTER — Telehealth (INDEPENDENT_AMBULATORY_CARE_PROVIDER_SITE_OTHER): Payer: Self-pay | Admitting: Internal Medicine

## 2023-03-07 DIAGNOSIS — E559 Vitamin D deficiency, unspecified: Secondary | ICD-10-CM

## 2023-03-07 DIAGNOSIS — R7303 Prediabetes: Secondary | ICD-10-CM

## 2023-03-07 NOTE — Telephone Encounter (Signed)
Pt called 03/07/23 her prescription

## 2023-03-07 NOTE — Telephone Encounter (Signed)
Pt called 03/07/23 her prescription for Metformin and Vitamin D was sent to [facility] she would like for it to be sent to [facility]

## 2023-03-08 MED ORDER — ERGOCALCIFEROL 1.25 MG (50000 UT) PO CAPS
50000.0000 [IU] | ORAL_CAPSULE | ORAL | 0 refills | Status: DC
Start: 2023-03-08 — End: 2023-06-12

## 2023-03-08 MED ORDER — METFORMIN HCL ER 500 MG PO TB24
500.0000 mg | ORAL_TABLET | Freq: Two times a day (BID) | ORAL | 0 refills | Status: DC
Start: 2023-03-08 — End: 2023-04-03

## 2023-03-08 NOTE — Telephone Encounter (Signed)
Resent to [facility]. ?

## 2023-04-03 ENCOUNTER — Ambulatory Visit (INDEPENDENT_AMBULATORY_CARE_PROVIDER_SITE_OTHER): Payer: Medicaid Other | Admitting: Internal Medicine

## 2023-04-03 ENCOUNTER — Encounter (INDEPENDENT_AMBULATORY_CARE_PROVIDER_SITE_OTHER): Payer: Self-pay | Admitting: Internal Medicine

## 2023-04-03 DIAGNOSIS — R7303 Prediabetes: Secondary | ICD-10-CM | POA: Diagnosis not present

## 2023-04-03 DIAGNOSIS — Z903 Acquired absence of stomach [part of]: Secondary | ICD-10-CM

## 2023-04-03 DIAGNOSIS — Z6841 Body Mass Index (BMI) 40.0 and over, adult: Secondary | ICD-10-CM

## 2023-04-03 MED ORDER — METFORMIN HCL ER 500 MG PO TB24: 500.0000 mg | ORAL_TABLET | Freq: Two times a day (BID) | ORAL | 0 refills | Status: DC

## 2023-04-03 NOTE — Assessment & Plan Note (Signed)
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  We had to talk about expectations and also although difficulties on losing weight.  She has been overcoming a number of barriers.  We will continue with nutritional and behavioral strategies and patient education. 

## 2023-04-03 NOTE — Progress Notes (Signed)
Office: [phone]  /  Fax: [phone]  WEIGHT SUMMARY AND BIOMETRICS  Vitals Temp: 99.3 F (37.4 C) BP: 138/82 Pulse Rate: 87 SpO2: 98 %   Anthropometric Measurements Height: 5\' 8"  (1.727 m) Weight: (!) 423 lb (191.9 kg) BMI (Calculated): 64.33 Weight at Last Visit: 418 Weight Gained Since Last Visit: 5 lb Starting Weight: 421 lb Total Weight Loss (lbs): 0 lb (0 kg) Peak Weight: 546 lb   Body Composition  Body Fat %: 64.2 % Fat Mass (lbs): 271.6 lbs Muscle Mass (lbs): 144 lbs Visceral Fat Rating : 30    No data recorded Today's Visit #: 6  Starting Date: 11/22/22   HPI  Chief Complaint: OBESITY  Ashlee Gordon is here to discuss her progress with her obesity treatment plan. She is on the keeping a food journal and adhering to recommended goals of 2200 calories and 90 protein and states she is following her eating plan approximately 20 % of the time. She states she is exercising 120 minutes 3 times per week.  Interval History:  Since last office visit she has gained 5 pounds.   She reports fair adherence to reduced calorie nutritional plan. She has been working on not skipping meals, increasing protein intake at every meal, eating more fruits, eating more vegetables, and drinking more water Reports problems with appetite and hunger signals.  Denies problems with satiety and satiation.  Denies problems with eating patterns and portion control.  Denies abnormal cravings. Denies feeling deprived or restricted.   Barriers identified:  needs accountability to avoid dietary variation .   Pharmacotherapy for weight loss: She is currently taking Metformin (off label use for incretin effect and / or insulin resistance and / or diabetes prevention) .    ASSESSMENT AND PLAN  TREATMENT PLAN FOR OBESITY:  Recommended Dietary Goals  Ashlee Gordon is currently in the action stage of change. As such, her goal is to continue weight management plan. She has agreed to: keep a  food journal with a target of  2000 calories and 100 grams of protein  and continue current plan  Behavioral Intervention  We discussed the following Behavioral Modification Strategies today: increasing lean protein intake, decreasing simple carbohydrates , increasing vegetables, increasing lower glycemic fruits, increasing water intake, work on tracking and journaling calories using tracking application, reading food labels , continue to practice mindfulness when eating, and planning for success.  Additional resources provided today: Handout and personalized instruction on tracking and journaling using Apps  Recommended Physical Activity Goals  Ashlee Gordon has been advised to work up to 150 minutes of moderate intensity aerobic activity a week and strengthening exercises 2-3 times per week for cardiovascular health, weight loss maintenance and preservation of muscle mass.   She has agreed to :  Think about ways to increase daily physical activity and overcoming barriers to exercise and Increase physical activity in their day and reduce sedentary time (increase NEAT).  Pharmacotherapy We discussed various medication options to help Ashlee Gordon with her weight loss efforts and we both agreed to : increase metformin XR 500 mg twice daily with primary indication of diabetes prevention.  ASSOCIATED CONDITIONS ADDRESSED TODAY  Class 3 severe obesity with serious comorbidity and body mass index (BMI) of 60.0 to 69.9 in adult, unspecified obesity type [facility])  Prediabetes Assessment & Plan: Most recent A1c is  Lab Results  Component Value Date   HGBA1C 6.1 (H) 11/22/2022  . Patient informed of disease state and risk of progression. This may contribute to abnormal  cravings, fatigue and diabetes complications without having diabetes.   She is to increase metformin to twice a day for incretin effect and pharmacoprophylaxis.  Her insurance does not cover GLP-1 medication.   Orders: -     metFORMIN HCl  ER; Take 1 tablet (500 mg total) by mouth 2 (two) times daily with a meal.  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  We had to talk about expectations and also although difficulties on losing weight.  She has been overcoming a number of barriers.  We will continue with nutritional and behavioral strategies and patient education.     PHYSICAL EXAM:  Blood pressure 138/82, pulse 87, temperature 99.3 F (37.4 C), height 5\' 8"  (1.727 m), weight (!) 423 lb (191.9 kg), last menstrual period 10/22/2020, SpO2 98 %. Body mass index is 64.32 kg/m.  General: She is overweight, cooperative, alert, well developed, and in no acute distress. PSYCH: Has normal mood, affect and thought process.   HEENT: EOMI, sclerae are anicteric. Lungs: Normal breathing effort, no conversational dyspnea. Extremities: No edema.  Neurologic: No gross sensory or motor deficits. No tremors or fasciculations noted.    DIAGNOSTIC DATA REVIEWED:  BMET    Component Value Date/Time   NA 141 11/22/2022 1016   K 4.1 11/22/2022 1016   CL 101 11/22/2022 1016   CO2 23 11/22/2022 1016   GLUCOSE 94 11/22/2022 1016   GLUCOSE 110 (H) 08/08/2022 1805   BUN 15 11/22/2022 1016   CREATININE 0.92 11/22/2022 1016   CALCIUM 9.0 11/22/2022 1016   GFRNONAA 36 (L) 08/08/2022 1805   Lab Results  Component Value Date   HGBA1C 6.1 (H) 11/22/2022   Lab Results  Component Value Date   INSULIN 37.9 (H) 11/22/2022   Lab Results  Component Value Date   TSH 1.220 11/22/2022   CBC    Component Value Date/Time   WBC 13.9 (H) 11/22/2022 1016   WBC 9.1 08/08/2022 1805   RBC 4.78 11/22/2022 1016   RBC 4.47 08/08/2022 1805   HGB 12.8 11/22/2022 1016   HCT 41.0 11/22/2022 1016   PLT 183 08/08/2022 1805   MCV 86 11/22/2022 1016   MCH 26.8 11/22/2022 1016   MCH 27.5 08/08/2022 1805   MCHC 31.2 (L) 11/22/2022 1016   MCHC 32.1  08/08/2022 1805   RDW 13.8 11/22/2022 1016   Iron Studies No results found for: "IRON", "TIBC", "FERRITIN", "IRONPCTSAT" Lipid Panel     Component Value Date/Time   CHOL 185 11/22/2022 1016   TRIG 74 11/22/2022 1016   HDL 54 11/22/2022 1016   LDLCALC 117 (H) 11/22/2022 1016   Hepatic Function Panel     Component Value Date/Time   PROT 7.5 11/22/2022 1016   ALBUMIN 4.0 11/22/2022 1016   AST 16 11/22/2022 1016   ALT 14 11/22/2022 1016   ALKPHOS 86 11/22/2022 1016   BILITOT 0.2 11/22/2022 1016      Component Value Date/Time   TSH 1.220 11/22/2022 1016   Nutritional Lab Results  Component Value Date   VD25OH 13.9 (L) 11/22/2022     Return in about 2 weeks (around 04/17/2023) for For Weight Mangement with Dr. Rikki Spearing.Marland Kitchen She was informed of the importance of frequent follow up visits to maximize her success with intensive lifestyle modifications for her multiple health conditions.   ATTESTASTION STATEMENTS:  Reviewed by clinician on day of visit: allergies, medications, problem list, medical history, surgical  history, family history, social history, and previous encounter notes.     Worthy Rancher, MD

## 2023-04-03 NOTE — Assessment & Plan Note (Signed)
Most recent A1c is  Lab Results  Component Value Date   HGBA1C 6.1 (H) 11/22/2022  . Patient informed of disease state and risk of progression. This may contribute to abnormal cravings, fatigue and diabetes complications without having diabetes.   She is to increase metformin to twice a day for incretin effect and pharmacoprophylaxis.  Her insurance does not cover GLP-1 medication.

## 2023-04-12 ENCOUNTER — Ambulatory Visit (INDEPENDENT_AMBULATORY_CARE_PROVIDER_SITE_OTHER): Payer: Medicaid Other

## 2023-04-12 ENCOUNTER — Other Ambulatory Visit: Payer: Self-pay | Admitting: Podiatry

## 2023-04-12 ENCOUNTER — Ambulatory Visit (INDEPENDENT_AMBULATORY_CARE_PROVIDER_SITE_OTHER): Payer: Medicaid Other | Admitting: Podiatry

## 2023-04-12 ENCOUNTER — Encounter: Payer: Self-pay | Admitting: Podiatry

## 2023-04-12 DIAGNOSIS — M25471 Effusion, right ankle: Secondary | ICD-10-CM

## 2023-04-12 DIAGNOSIS — M7671 Peroneal tendinitis, right leg: Secondary | ICD-10-CM | POA: Diagnosis not present

## 2023-04-12 MED ORDER — TRIAMCINOLONE ACETONIDE 10 MG/ML IJ SUSP
10.0000 mg | Freq: Once | INTRAMUSCULAR | Status: AC
Start: 2023-04-12 — End: 2023-04-12
  Administered 2023-04-12: 10 mg

## 2023-04-12 NOTE — Progress Notes (Signed)
Subjective:   Patient ID: Ashlee Gordon, female   DOB: 52 y.o.   MRN: [id number]   HPI Patient presents stating a lot of pain on the outside of her right foot with patient experiencing severe obesity is complicating factor   ROS      Objective:  Physical Exam  Neuro vas status intact inflammation of the lateral side of the right foot around the peroneal tendon but no indication of tendon dysfunction or loss of strength     Assessment:  Peroneal tendinitis right with digital deformities also noted     Plan:  Sterile prep injected the sheath of the tendon after explaining risk 3 mg dexamethasone Kenalog 5 mg Xylocaine and went ahead and advised on supportive shoes

## 2023-04-24 ENCOUNTER — Ambulatory Visit (INDEPENDENT_AMBULATORY_CARE_PROVIDER_SITE_OTHER): Payer: Medicaid Other | Admitting: Internal Medicine

## 2023-04-24 ENCOUNTER — Encounter (INDEPENDENT_AMBULATORY_CARE_PROVIDER_SITE_OTHER): Payer: Self-pay | Admitting: Internal Medicine

## 2023-04-24 VITALS — BP 138/73 | HR 87 | Temp 97.8°F | Ht 68.0 in | Wt >= 6400 oz

## 2023-04-24 DIAGNOSIS — Z6841 Body Mass Index (BMI) 40.0 and over, adult: Secondary | ICD-10-CM

## 2023-04-24 DIAGNOSIS — R7303 Prediabetes: Secondary | ICD-10-CM | POA: Diagnosis not present

## 2023-04-24 DIAGNOSIS — I10 Essential (primary) hypertension: Secondary | ICD-10-CM | POA: Diagnosis not present

## 2023-04-24 DIAGNOSIS — Z903 Acquired absence of stomach [part of]: Secondary | ICD-10-CM

## 2023-04-24 NOTE — Assessment & Plan Note (Addendum)
With history of gastric sleeve and metabolic adaptations.  On 24-hour recall she is not getting the prescribed amount of protein which is likely the reason that she is not experiencing satiety at her weight loss has decreased.  She will work on getting 30 to 40 g of protein per meal.  Patient benefits from incretin therapy but they do not cover antiobesity medication.

## 2023-04-24 NOTE — Progress Notes (Signed)
Office: [phone]  /  Fax: [phone]  WEIGHT SUMMARY AND BIOMETRICS  Vitals Temp: 97.8 F (36.6 C) BP: 138/73 Pulse Rate: 87 SpO2: 97 %   Anthropometric Measurements Height: 5\' 8"  (1.727 m) Weight: (!) 426 lb (193.2 kg) BMI (Calculated): 64.79 Weight at Last Visit: 423lb Weight Lost Since Last Visit: 0 Weight Gained Since Last Visit: 3lb Starting Weight: 421lb Total Weight Loss (lbs): 0 lb (0 kg) Peak Weight: 546lb   Body Composition  Body Fat %: 63.7 % Fat Mass (lbs): 271.8 lbs Muscle Mass (lbs): 147.2 lbs Visceral Fat Rating : 30    No data recorded Today's Visit #: 7  Starting Date: 11/22/22   HPI  Chief Complaint: OBESITY  Ashlee Gordon is here to discuss her progress with her obesity treatment plan. She is on the keeping a food journal and adhering to recommended goals of 2200 calories and 90 protein and states she is following her eating plan approximately 25 % of the time. She states she is exercising by doing water aerobics  60 minutes 4 times per week.  Interval History:  Since last office visit she has gained 3 pounds.  She acknowledges deviation from nutrition plan.  She has been focusing more none pound goals.  She reports suboptimal adherence She has been working on Engineer, materials, working on gradual implementation of reduced calorie nutrition plan, and reducing portion sizes  24-hour recall: For breakfast she had a sausage patty-chicken, with white bread toast and watermelon.  For lunch she had a [place] suis sandwich on white bread and single serving of lace potato chips.  For dinner she had a broccoli slaw and rotisserie chicken to nectarines plus an orange.  For snacks she has had 6 Ritz crackers with peanut butter has been drinking green tea and water.  Orixegenic Control: Reports problems with appetite and hunger signals.  Reports problems with satiety and satiation.  Denies problems with eating patterns and portion control.   Denies abnormal cravings. Denies feeling deprived or restricted.   Barriers identified: strong hunger signals and appetite, having difficulty focusing on healthy eating, exposure to enticing environments and or relationships, orthopedic problems, medical conditions or chronic pain affecting mobility, and history of gastric bypass with metabolic adaptations .   Pharmacotherapy for weight loss: She is currently taking  last office visit we had prescribed Wegovy but medication is not covered by her insurance.   ASSESSMENT AND PLAN  TREATMENT PLAN FOR OBESITY:  Recommended Dietary Goals  Ashlee Gordon is currently in the action stage of change. As such, her goal is to continue weight management plan. She has agreed to: continue current plan  Behavioral Intervention  We discussed the following Behavioral Modification Strategies today: increasing lean protein intake, decreasing simple carbohydrates , increasing vegetables, increasing lower glycemic fruits, increasing fiber rich foods, avoiding skipping meals, increasing water intake, work on meal planning and preparation, keeping healthy foods at home, avoiding temptations and identifying enticing environmental cues, and planning for success.  Additional resources provided today: Handout on carbohydrates and insulin resistance  Recommended Physical Activity Goals  Ashlee Gordon has been advised to work up to 150 minutes of moderate intensity aerobic activity a week and strengthening exercises 2-3 times per week for cardiovascular health, weight loss maintenance and preservation of muscle mass.   She has agreed to :  Continue current level of physical activity   Pharmacotherapy We discussed various medication options to help Ashlee Gordon with her weight loss efforts and we both agreed to : continue  current anti-obesity medication regimen  ASSOCIATED CONDITIONS ADDRESSED TODAY  Class 3 severe obesity with serious comorbidity and body mass index (BMI) of  60.0 to 69.9 in adult, unspecified obesity type [facility]) Assessment & Plan: With history of gastric sleeve and metabolic adaptations.  On 24-hour recall she is not getting the prescribed amount of protein which is likely the reason that she is not experiencing satiety at her weight loss has decreased.  She will work on getting 30 to 40 g of protein per meal.  Patient benefits from incretin therapy but they do not cover antiobesity medication.   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  We had to talk about expectations and also although difficulties on losing weight.  She has been overcoming a number of barriers.  We will continue with nutritional and behavioral strategies and patient education.   Prediabetes Assessment & Plan: Most recent A1c is  Lab Results  Component Value Date   HGBA1C 6.1 (H) 11/22/2022  . Patient informed of disease state and risk of progression. This may contribute to abnormal cravings, fatigue and diabetes complications without having diabetes.   She is to increase metformin to twice a day for incretin effect and pharmacoprophylaxis.  Her insurance does not cover GLP-1 medication.  She is under the impression that she has type 2 diabetes which may affect eligibility for incretin therapy.  She will bring her last office visit note as well as 2 years of labs to make sure that she has been properly diagnosed.  I do not have access to [place] records in Care Everywhere so patient will be print and bring to her next appointment.    Primary hypertension Assessment & Plan: Blood pressure significantly improved but still not at goal.  She is now on amlodipine 10 mg a day and hydrochlorothiazide 25 mg daily.  Losing 10% of body weight may improve blood pressure control.  Continue to follow with PCP and monitoring blood pressures at home     PHYSICAL EXAM:  Blood pressure 138/73, pulse 87, temperature  97.8 F (36.6 C), height 5\' 8"  (1.727 m), weight (!) 426 lb (193.2 kg), last menstrual period 10/22/2020, SpO2 97 %. Body mass index is 64.77 kg/m.  General: She is overweight, cooperative, alert, well developed, and in no acute distress. PSYCH: Has normal mood, affect and thought process.   HEENT: EOMI, sclerae are anicteric. Lungs: Normal breathing effort, no conversational dyspnea. Extremities: No edema.  Neurologic: No gross sensory or motor deficits. No tremors or fasciculations noted.    DIAGNOSTIC DATA REVIEWED:  BMET    Component Value Date/Time   NA 141 11/22/2022 1016   K 4.1 11/22/2022 1016   CL 101 11/22/2022 1016   CO2 23 11/22/2022 1016   GLUCOSE 94 11/22/2022 1016   GLUCOSE 110 (H) 08/08/2022 1805   BUN 15 11/22/2022 1016   CREATININE 0.92 11/22/2022 1016   CALCIUM 9.0 11/22/2022 1016   GFRNONAA 36 (L) 08/08/2022 1805   Lab Results  Component Value Date   HGBA1C 6.1 (H) 11/22/2022   Lab Results  Component Value Date   INSULIN 37.9 (H) 11/22/2022   Lab Results  Component Value Date   TSH 1.220 11/22/2022   CBC    Component Value Date/Time   WBC 13.9 (H) 11/22/2022 1016   WBC 9.1 08/08/2022 1805   RBC 4.78 11/22/2022 1016   RBC 4.47 08/08/2022 1805   HGB 12.8 11/22/2022 1016   HCT  41.0 11/22/2022 1016   PLT 183 08/08/2022 1805   MCV 86 11/22/2022 1016   MCH 26.8 11/22/2022 1016   MCH 27.5 08/08/2022 1805   MCHC 31.2 (L) 11/22/2022 1016   MCHC 32.1 08/08/2022 1805   RDW 13.8 11/22/2022 1016   Iron Studies No results found for: "IRON", "TIBC", "FERRITIN", "IRONPCTSAT" Lipid Panel     Component Value Date/Time   CHOL 185 11/22/2022 1016   TRIG 74 11/22/2022 1016   HDL 54 11/22/2022 1016   LDLCALC 117 (H) 11/22/2022 1016   Hepatic Function Panel     Component Value Date/Time   PROT 7.5 11/22/2022 1016   ALBUMIN 4.0 11/22/2022 1016   AST 16 11/22/2022 1016   ALT 14 11/22/2022 1016   ALKPHOS 86 11/22/2022 1016   BILITOT 0.2 11/22/2022  1016      Component Value Date/Time   TSH 1.220 11/22/2022 1016   Nutritional Lab Results  Component Value Date   VD25OH 13.9 (L) 11/22/2022     Return in about 3 weeks (around 05/15/2023) for For Weight Mangement with Dr. Rikki Spearing.Marland Kitchen She was informed of the importance of frequent follow up visits to maximize her success with intensive lifestyle modifications for her multiple health conditions.   ATTESTASTION STATEMENTS:  Reviewed by clinician on day of visit: allergies, medications, problem list, medical history, surgical history, family history, social history, and previous encounter notes.   I have spent 30 minutes in the care of the patient today including: preparing to see patient (e.g. review and interpretation of tests, old notes ), obtaining and/or reviewing separately obtained history, counseling and educating the patient, documenting clinical information in the electronic or other health care record, and independently interpreting results and communicating results to the patient,family, or caregiver   Worthy Rancher, MD

## 2023-04-24 NOTE — Assessment & Plan Note (Signed)
Blood pressure significantly improved but still not at goal.  She is now on amlodipine 10 mg a day and hydrochlorothiazide 25 mg daily.  Losing 10% of body weight may improve blood pressure control.  Continue to follow with PCP and monitoring blood pressures at home

## 2023-04-24 NOTE — Assessment & Plan Note (Signed)
Most recent A1c is  Lab Results  Component Value Date   HGBA1C 6.1 (H) 11/22/2022  . Patient informed of disease state and risk of progression. This may contribute to abnormal cravings, fatigue and diabetes complications without having diabetes.   She is to increase metformin to twice a day for incretin effect and pharmacoprophylaxis.  Her insurance does not cover GLP-1 medication.  She is under the impression that she has type 2 diabetes which may affect eligibility for incretin therapy.  She will bring her last office visit note as well as 2 years of labs to make sure that she has been properly diagnosed.  I do not have access to [place] records in Care Everywhere so patient will be print and bring to her next appointment.

## 2023-04-24 NOTE — Assessment & Plan Note (Signed)
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  We had to talk about expectations and also although difficulties on losing weight.  She has been overcoming a number of barriers.  We will continue with nutritional and behavioral strategies and patient education. 

## 2023-05-15 ENCOUNTER — Telehealth (INDEPENDENT_AMBULATORY_CARE_PROVIDER_SITE_OTHER): Payer: Self-pay

## 2023-05-15 ENCOUNTER — Ambulatory Visit (INDEPENDENT_AMBULATORY_CARE_PROVIDER_SITE_OTHER): Payer: Medicaid Other | Admitting: Internal Medicine

## 2023-05-15 ENCOUNTER — Encounter (INDEPENDENT_AMBULATORY_CARE_PROVIDER_SITE_OTHER): Payer: Self-pay | Admitting: Internal Medicine

## 2023-05-15 VITALS — BP 138/83 | HR 85 | Temp 98.2°F | Ht 68.0 in | Wt >= 6400 oz

## 2023-05-15 DIAGNOSIS — I10 Essential (primary) hypertension: Secondary | ICD-10-CM

## 2023-05-15 DIAGNOSIS — Z903 Acquired absence of stomach [part of]: Secondary | ICD-10-CM

## 2023-05-15 DIAGNOSIS — R7303 Prediabetes: Secondary | ICD-10-CM | POA: Diagnosis not present

## 2023-05-15 DIAGNOSIS — G4733 Obstructive sleep apnea (adult) (pediatric): Secondary | ICD-10-CM

## 2023-05-15 DIAGNOSIS — Z6841 Body Mass Index (BMI) 40.0 and over, adult: Secondary | ICD-10-CM

## 2023-05-15 MED ORDER — METFORMIN HCL ER 500 MG PO TB24: 500.0000 mg | ORAL_TABLET | Freq: Two times a day (BID) | ORAL | 0 refills | Status: DC

## 2023-05-15 MED ORDER — TIRZEPATIDE 2.5 MG/0.5ML ~~LOC~~ SOAJ
2.5000 mg | SUBCUTANEOUS | 0 refills | Status: DC
Start: 2023-05-15 — End: 2023-06-12

## 2023-05-15 NOTE — Assessment & Plan Note (Signed)
Most recent A1c is  Lab Results  Component Value Date   HGBA1C 6.1 (H) 11/22/2022  . Patient informed of disease state and risk of progression. This may contribute to abnormal cravings, fatigue and diabetes complications without having diabetes.   She is currently on metformin twice a day without any adverse effects.  She brought in several hemoglobin A1c's from the past from [facility] medical she has been as high as 6.3.  In addition to nutritional, behavioral strategies and metformin pharmacoprophylaxis she will benefit from incretin therapy for assistance with her complex obesity.

## 2023-05-15 NOTE — Assessment & Plan Note (Signed)
Untreated, Epworth of 5.  Counseled on risk associated with untreated sleep apnea.  May benefit from a repeat sleep study following weight loss due to gastric sleeve surgery.  Losing 15% of body weight may improve condition.  Patient advised to review this with primary care team.

## 2023-05-15 NOTE — Assessment & Plan Note (Signed)
With history of gastric sleeve and metabolic adaptations.  She will continue to work on nutrition and behavioral strategies.  Her IC was higher than her calculated BMR which I feel may be over representing her caloric requirements.  She will track and journal 3 to 4 days out of the week and we will look at calories and protein intake and adjust calories and macronutrient composition.  She also benefits from incretin therapy.  After discussion of benefits and side effect she will be prescribed tirzepatide 2.5 mg once a week

## 2023-05-15 NOTE — Progress Notes (Signed)
Office: [phone]  /  Fax: [phone]  WEIGHT SUMMARY AND BIOMETRICS  Vitals Temp: 98.2 F (36.8 C) BP: 138/83 Pulse Rate: 85 SpO2: 97 %   Anthropometric Measurements Height: 5\' 8"  (1.727 m) Weight: (!) 426 lb (193.2 kg) BMI (Calculated): 64.79 Weight at Last Visit: 426 lb Starting Weight: 421 lb Total Weight Loss (lbs): 0 lb (0 kg) Peak Weight: 546 lb   Body Composition  Body Fat %: 62.8 % Fat Mass (lbs): 268 lbs Muscle Mass (lbs): 150.6 lbs Visceral Fat Rating : 30    No data recorded Today's Visit #: 8  Starting Date: 11/22/22   HPI  Chief Complaint: OBESITY  Ashlee Gordon is here to discuss her progress with her obesity treatment plan. She is on the the Category 4 Plan and states she is following her eating plan approximately 25 % of the time. She states she is exercising 120 minutes 5 times per week.  Interval History:  Since last office visit she has maintained.  She is somewhat disappointed due to perceived effort and increased volume of physical activity.  Her insurance denied Reginal Lutes She reports suboptimal adherence She has been working on not skipping meals, increasing protein intake at every meal, and making healthier choices  Orixegenic Control: Reports problems with appetite and hunger signals.  Reports problems with satiety and satiation.  Denies problems with eating patterns and portion control.  Denies abnormal cravings. Denies feeling deprived or restricted.   Barriers identified: medical comorbidities and metabolic adaptations associated with gastric bypass .   Pharmacotherapy for weight loss: She is currently taking Metformin (off label use for incretin effect and / or insulin resistance and / or diabetes prevention) with adequate clinical response  and without side effects..    ASSESSMENT AND PLAN  TREATMENT PLAN FOR OBESITY:  Recommended Dietary Goals  Ashlee Gordon is currently in the action stage of change. As such, her goal is to  continue weight management plan. She has agreed to: continue current plan  Behavioral Intervention  We discussed the following Behavioral Modification Strategies today: increasing lean protein intake, decreasing simple carbohydrates , increasing vegetables, increasing lower glycemic fruits, increasing fiber rich foods, increasing water intake, work on meal planning and preparation, work on tracking and journaling calories using tracking application, and planning for success.  Additional resources provided today: None  Recommended Physical Activity Goals  Ashlee Gordon has been advised to work up to 150 minutes of moderate intensity aerobic activity a week and strengthening exercises 2-3 times per week for cardiovascular health, weight loss maintenance and preservation of muscle mass.   She has agreed to :  Continue current level of physical activity   Pharmacotherapy We discussed various medication options to help [place] with her weight loss efforts and we both agreed to : start anti-obesity medication.  In addition to reduced calorie nutrition plan (RCNP), behavioral strategies and physical activity, [name] would benefit from pharmacotherapy to assist with hunger signals, satiety and cravings. This will reduce obesity-related health risks by inducing weight loss, and help reduce food consumption and adherence to [facility]) . It may also improve QOL by improving self-confidence and reduce the  setbacks associated with metabolic adaptations.  After discussion of treatment options, mechanisms of action, benefits, side effects, contraindications and shared decision making she is agreeable to starting tirzepatide 2.5 mg once a week. Patient also made aware that medication is indicated for long-term management of obesity and the risk of weight regain following discontinuation of treatment and hence the importance of adhering  to medical weight loss plan.  We demonstrated use of device and patient using teach back  method was able to demonstrate proper technique.  ASSOCIATED CONDITIONS ADDRESSED TODAY  H/O gastric sleeve Assessment & Plan: She had gastric sleeve in 2015 her preoperative weight was 536 with a nadir of 418 and weight regain.  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  We had to talk about expectations and also although difficulties on losing weight.  She has been overcoming a number of barriers.  We will continue with nutritional and behavioral strategies and patient education.  Orders: -     Tirzepatide; Inject 2.5 mg into the skin once a week.  Dispense: 2 mL; Refill: 0  Prediabetes Assessment & Plan: Most recent A1c is  Lab Results  Component Value Date   HGBA1C 6.1 (H) 11/22/2022  . Patient informed of disease state and risk of progression. This may contribute to abnormal cravings, fatigue and diabetes complications without having diabetes.   She is currently on metformin twice a day without any adverse effects.  She brought in several hemoglobin A1c's from the past from [facility] medical she has been as high as 6.3.  In addition to nutritional, behavioral strategies and metformin pharmacoprophylaxis she will benefit from incretin therapy for assistance with her complex obesity.   Orders: -     metFORMIN HCl ER; Take 1 tablet (500 mg total) by mouth 2 (two) times daily with a meal.  Dispense: 60 tablet; Refill: 0 -     Tirzepatide; Inject 2.5 mg into the skin once a week.  Dispense: 2 mL; Refill: 0  Class 3 severe obesity with serious comorbidity and body mass index (BMI) of 60.0 to 69.9 in adult, unspecified obesity type [facility]) Assessment & Plan: With history of gastric sleeve and metabolic adaptations.  She will continue to work on nutrition and behavioral strategies.  Her IC was higher than her calculated BMR which I feel may be over representing her caloric requirements.  She will track and journal 3 to 4 days out of the week and we will look  at calories and protein intake and adjust calories and macronutrient composition.  She also benefits from incretin therapy.  After discussion of benefits and side effect she will be prescribed tirzepatide 2.5 mg once a week  Orders: -     Tirzepatide; Inject 2.5 mg into the skin once a week.  Dispense: 2 mL; Refill: 0  OSA (obstructive sleep apnea) Assessment & Plan: Untreated, Epworth of 5.  Counseled on risk associated with untreated sleep apnea.  May benefit from a repeat sleep study following weight loss due to gastric sleeve surgery.  Losing 15% of body weight may improve condition.  Patient advised to review this with primary care team.  Orders: -     Tirzepatide; Inject 2.5 mg into the skin once a week.  Dispense: 2 mL; Refill: 0  Primary hypertension Assessment & Plan: Her blood pressure has improved but still not at goal. She is now on amlodipine 10 mg a day and hydrochlorothiazide 25 mg daily.  Losing 10% of body weight may improve blood pressure control.  Continue to follow with PCP and monitoring blood pressures at home     PHYSICAL EXAM:  Blood pressure 138/83, pulse 85, temperature 98.2 F (36.8 C), height 5\' 8"  (1.727 m), weight (!) 426 lb (193.2 kg), last menstrual period 10/22/2020, SpO2 97%. Body mass index is 64.77 kg/m.  General: She is overweight, cooperative, alert, well  developed, and in no acute distress. PSYCH: Has normal mood, affect and thought process.   HEENT: EOMI, sclerae are anicteric. Lungs: Normal breathing effort, no conversational dyspnea. Extremities: No edema.  Neurologic: No gross sensory or motor deficits. No tremors or fasciculations noted.    DIAGNOSTIC DATA REVIEWED:  BMET    Component Value Date/Time   NA 141 11/22/2022 1016   K 4.1 11/22/2022 1016   CL 101 11/22/2022 1016   CO2 23 11/22/2022 1016   GLUCOSE 94 11/22/2022 1016   GLUCOSE 110 (H) 08/08/2022 1805   BUN 15 11/22/2022 1016   CREATININE 0.92 11/22/2022 1016   CALCIUM  9.0 11/22/2022 1016   GFRNONAA 36 (L) 08/08/2022 1805   Lab Results  Component Value Date   HGBA1C 6.1 (H) 11/22/2022   Lab Results  Component Value Date   INSULIN 37.9 (H) 11/22/2022   Lab Results  Component Value Date   TSH 1.220 11/22/2022   CBC    Component Value Date/Time   WBC 13.9 (H) 11/22/2022 1016   WBC 9.1 08/08/2022 1805   RBC 4.78 11/22/2022 1016   RBC 4.47 08/08/2022 1805   HGB 12.8 11/22/2022 1016   HCT 41.0 11/22/2022 1016   PLT 183 08/08/2022 1805   MCV 86 11/22/2022 1016   MCH 26.8 11/22/2022 1016   MCH 27.5 08/08/2022 1805   MCHC 31.2 (L) 11/22/2022 1016   MCHC 32.1 08/08/2022 1805   RDW 13.8 11/22/2022 1016   Iron Studies No results found for: "IRON", "TIBC", "FERRITIN", "IRONPCTSAT" Lipid Panel     Component Value Date/Time   CHOL 185 11/22/2022 1016   TRIG 74 11/22/2022 1016   HDL 54 11/22/2022 1016   LDLCALC 117 (H) 11/22/2022 1016   Hepatic Function Panel     Component Value Date/Time   PROT 7.5 11/22/2022 1016   ALBUMIN 4.0 11/22/2022 1016   AST 16 11/22/2022 1016   ALT 14 11/22/2022 1016   ALKPHOS 86 11/22/2022 1016   BILITOT 0.2 11/22/2022 1016      Component Value Date/Time   TSH 1.220 11/22/2022 1016   Nutritional Lab Results  Component Value Date   VD25OH 13.9 (L) 11/22/2022     Return in about 3 weeks (around 06/05/2023) for  40 minutes, For Weight Mangement with Dr. Rikki Spearing.Marland Kitchen She was informed of the importance of frequent follow up visits to maximize her success with intensive lifestyle modifications for her multiple health conditions.   ATTESTASTION STATEMENTS:  Reviewed by clinician on day of visit: allergies, medications, problem list, medical history, surgical history, family history, social history, and previous encounter notes.   I have spent 40 minutes in the care of the patient today including: preparing to see patient (e.g. review and interpretation of tests, old notes ), obtaining and/or reviewing  separately obtained history, counseling and educating the patient, ordering medications, test and procedures, documenting clinical information in the electronic or other health care record, and independently interpreting results and communicating results to the patient,family, or caregiver   Worthy Rancher, MD

## 2023-05-15 NOTE — Telephone Encounter (Signed)
Prior Berkley Harvey has been submitted for [facility].  [employer] has not yet replied to your PA request. You may close this dialog box, return to your dashboard, and perform other tasks. To check for an update later, open this request again from your dashboard.  If [facility] has not replied to your urgent request within 24 hours, please contact [employer] at the provider services number on the [employer] card.

## 2023-05-15 NOTE — Assessment & Plan Note (Addendum)
She had gastric sleeve in 2015 her preoperative weight was 536 with a nadir of 418 and weight regain.  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  We had to talk about expectations and also although difficulties on losing weight.  She has been overcoming a number of barriers.  We will continue with nutritional and behavioral strategies and patient education.

## 2023-05-15 NOTE — Assessment & Plan Note (Signed)
Her blood pressure has improved but still not at goal. She is now on amlodipine 10 mg a day and hydrochlorothiazide 25 mg daily.  Losing 10% of body weight may improve blood pressure control.  Continue to follow with PCP and monitoring blood pressures at home

## 2023-05-16 NOTE — Telephone Encounter (Signed)
Mounjaro Denied.   [facility] does not cover the following service(s) in the [facility] Plan: [id number] [facility] 2.5MG /0.5ML Soln Pen-inj Drugs used for weight loss are not a covered pharmacy benefit.

## 2023-05-25 ENCOUNTER — Ambulatory Visit (INDEPENDENT_AMBULATORY_CARE_PROVIDER_SITE_OTHER): Payer: Medicaid Other

## 2023-05-25 ENCOUNTER — Ambulatory Visit (INDEPENDENT_AMBULATORY_CARE_PROVIDER_SITE_OTHER): Payer: Medicaid Other | Admitting: Podiatry

## 2023-05-25 DIAGNOSIS — M7671 Peroneal tendinitis, right leg: Secondary | ICD-10-CM

## 2023-05-25 DIAGNOSIS — M722 Plantar fascial fibromatosis: Secondary | ICD-10-CM

## 2023-05-25 DIAGNOSIS — R600 Localized edema: Secondary | ICD-10-CM

## 2023-05-25 DIAGNOSIS — T148XXA Other injury of unspecified body region, initial encounter: Secondary | ICD-10-CM

## 2023-05-26 NOTE — Progress Notes (Signed)
Subjective:   Patient ID: Ashlee Gordon, female   DOB: 52 y.o.   MRN: [id number]   HPI Patient continues to have a lot of pain in her right ankle lateral side with a lot of swelling around the peroneal as it comes behind the medial malleolus with inability to walk with obesity is complicating factor   ROS      Objective:  Physical Exam  Significant discomfort lateral side right foot with pain that did not respond to previous medication and treatments     Assessment:  Cannot rule out the possibility for tear of the peroneal tendon with obesity is a significant complicating factor     Plan:  H&P MRI ordered and we will get the results of this and decide if anything can be done continue ice support shoes until the time we see results with all questions answered today concerning this condition

## 2023-05-30 IMAGING — XA Imaging study
4 series · 5 of 5 positions shown · IV contrast (omnipaque)
Comparison: none

CLINICAL DATA: Lumbosacral spondylosis without myelopathy. Severe
spondylosis L5-S1 on outside MRI.

EXAM:
SELECTIVE RIGHT L5 NERVE ROOT BLOCK AND TRANSFORAMINAL EPIDURAL
STEROID INJECTION UNDER FLUOROSCOPY
SELECTIVE LEFT L5 NERVE ROOT BLOCK AND TRANSFORAMINAL EPIDURAL
FLUOROSCOPY TIME:  40 seconds; 51 mGy
TECHNIQUE: The procedure, risks (including but not limited to bleeding,
infection, organ damage ), benefits, and alternatives were explained
to the patient. Questions regarding the procedure were encouraged
and answered. The patient understands and consents to the procedure.
Appropriate skin entry sites determined under fluoroscopy. Operator
donned sterile gloves and mask. Sites marked, prepped with Betadine,
draped in usual sterile fashion, infiltrated locally with 1%
lidocaine. A 22 gauge Chiba needle was advanced to the superior
ventral margin of the right L5-S1 neural foramen. Diagnostic
injection of 2 ml Omnipaque 180 showed partial outlining of the
exiting nerve root as well as epidural extension of contrast, with
no intravascular or subarachnoid component.

[Series 1: fl (-) angio · 1 of 1 slices shown (1 of 4)]
[im 1/1]
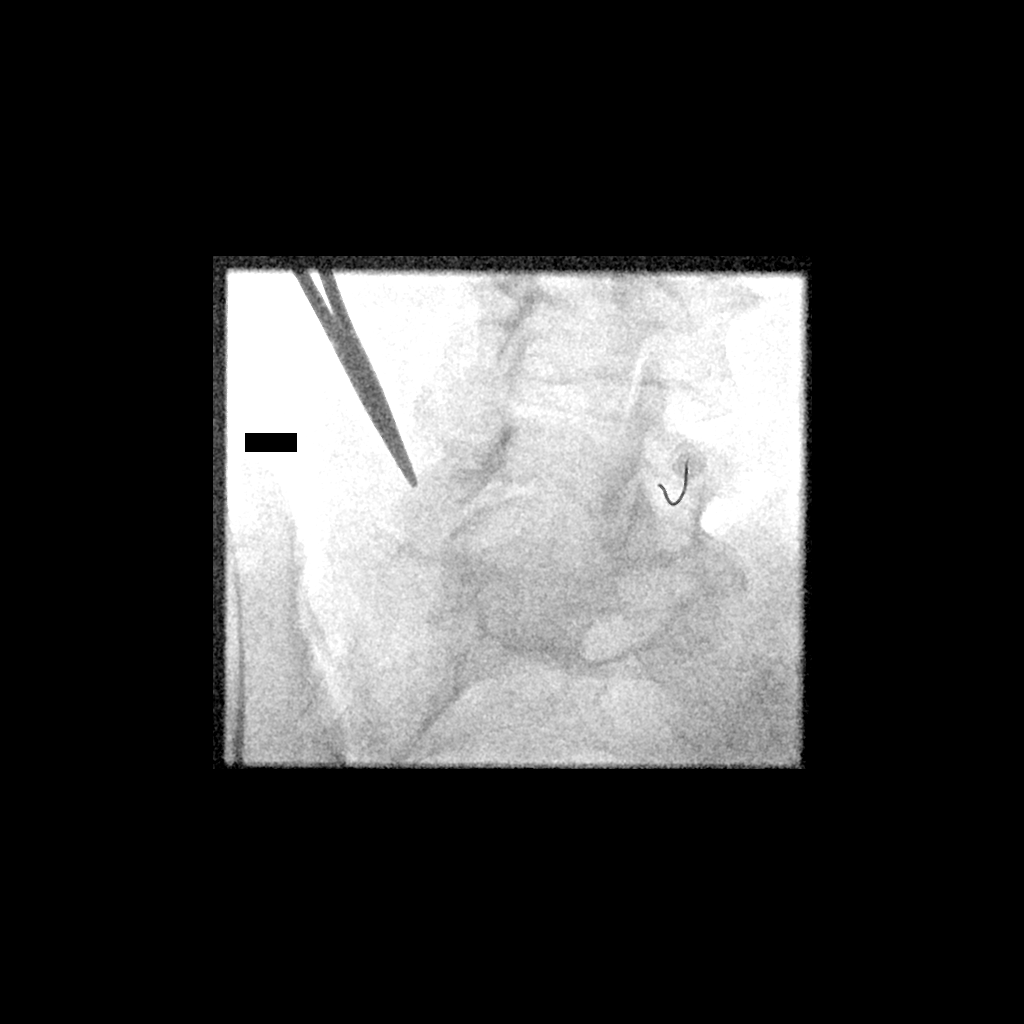

[Series 2: fl (-) angio · 1 of 1 slices shown (2 of 4)]
[im 1/1]
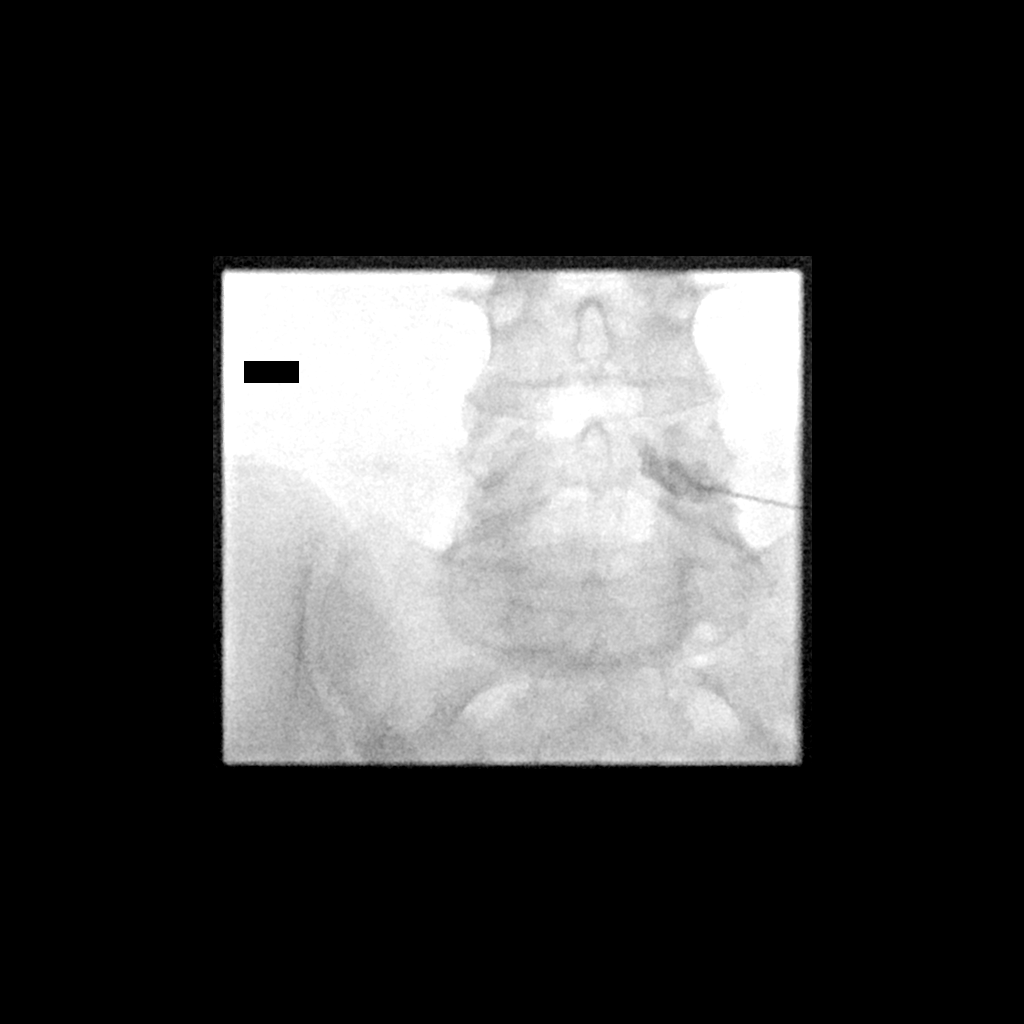

[Series 3: fl (-) angio · 1 of 1 slices shown (3 of 4)]
[im 1/1]
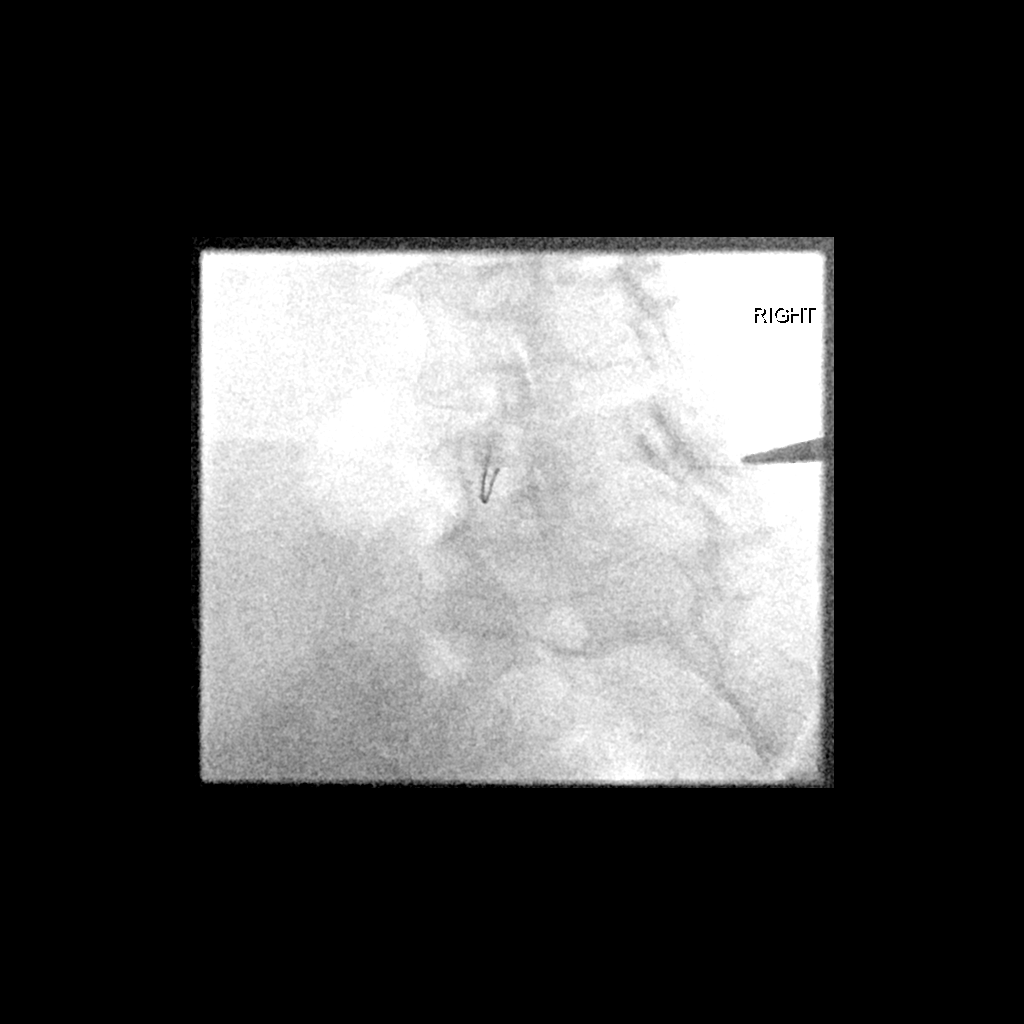

[Series 4: fl (-) angio · 2 of 2 slices shown (4 of 4)]
[im 1/2]
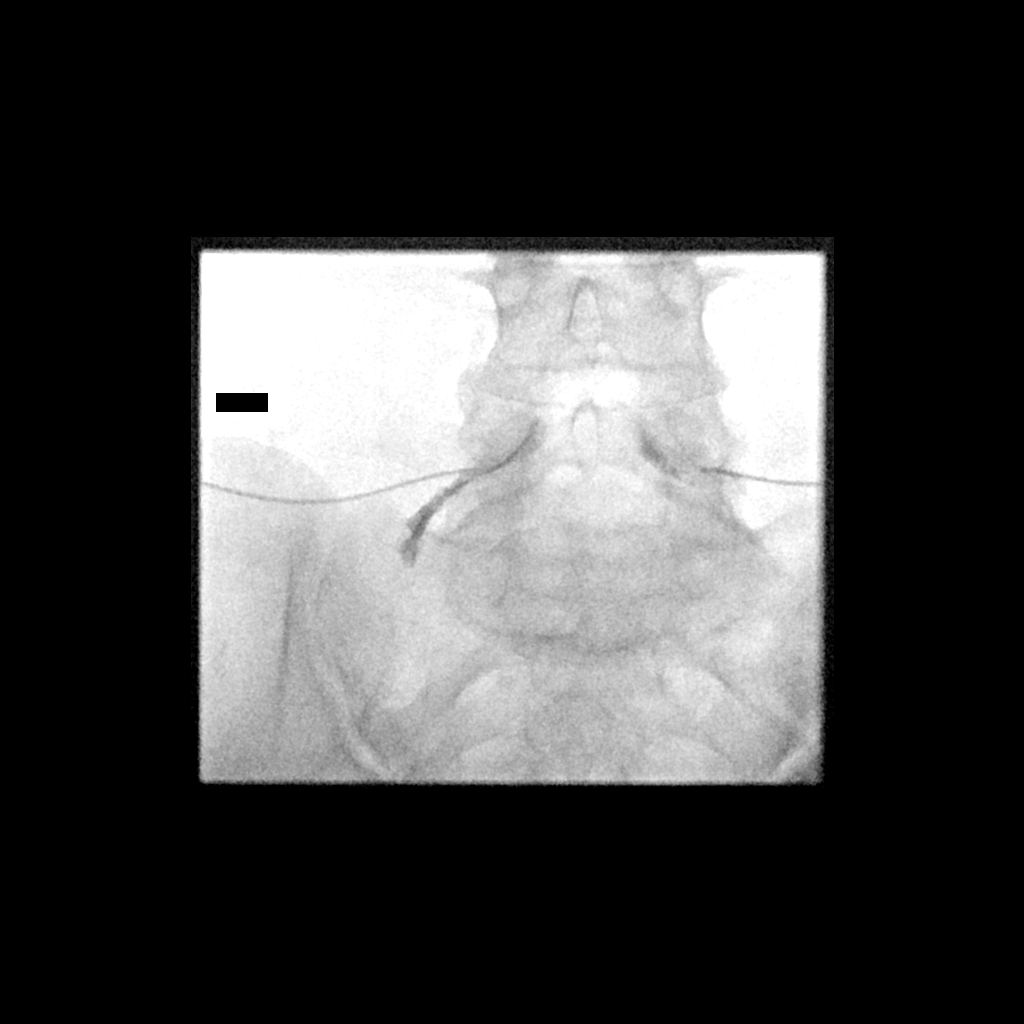
[im 2/2]
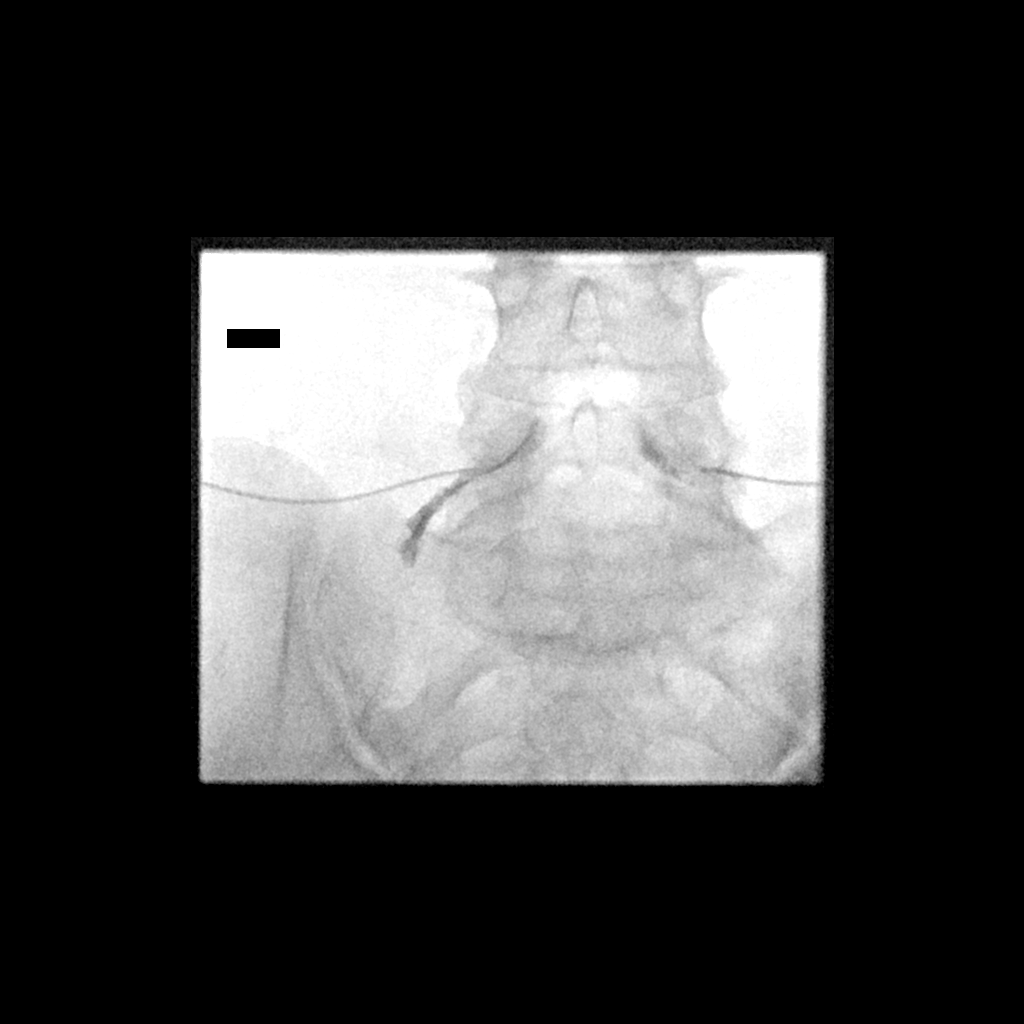

[5 of 5 positions shown; findings below may reference images not displayed]

A 22 gauge Chiba needle was advanced to the superior ventral margin
of the left L5-S1 neural foramen. Diagnostic injection of 2 ml
Omnipaque 180 showed partial outlining of the exiting nerve root as
well as epidural extension of contrast, with no intravascular or
subarachnoid component. 80 mg Depo-Medrol in 3 ml lidocaine 1% was
then divided evenly between the 2 sites. The patient tolerated
procedure well, with no immediate complication.
IMPRESSION: 1. Technically successful right L5 selective nerve root block and
transforaminal epidural steroid injection.
2. Technically successful left L5 selective nerve root block and
transforaminal epidural steroid injection

## 2023-06-07 ENCOUNTER — Ambulatory Visit (INDEPENDENT_AMBULATORY_CARE_PROVIDER_SITE_OTHER): Payer: Medicaid Other | Admitting: Internal Medicine

## 2023-06-12 ENCOUNTER — Other Ambulatory Visit (HOSPITAL_COMMUNITY): Payer: Self-pay

## 2023-06-12 ENCOUNTER — Ambulatory Visit (INDEPENDENT_AMBULATORY_CARE_PROVIDER_SITE_OTHER): Payer: Medicaid Other | Admitting: Internal Medicine

## 2023-06-12 ENCOUNTER — Encounter (INDEPENDENT_AMBULATORY_CARE_PROVIDER_SITE_OTHER): Payer: Self-pay | Admitting: Internal Medicine

## 2023-06-12 VITALS — BP 134/87 | HR 94 | Temp 97.7°F | Ht 68.0 in | Wt >= 6400 oz

## 2023-06-12 DIAGNOSIS — R7303 Prediabetes: Secondary | ICD-10-CM

## 2023-06-12 DIAGNOSIS — E559 Vitamin D deficiency, unspecified: Secondary | ICD-10-CM

## 2023-06-12 DIAGNOSIS — Z903 Acquired absence of stomach [part of]: Secondary | ICD-10-CM

## 2023-06-12 DIAGNOSIS — Z6841 Body Mass Index (BMI) 40.0 and over, adult: Secondary | ICD-10-CM | POA: Diagnosis not present

## 2023-06-12 MED ORDER — SEMAGLUTIDE-WEIGHT MANAGEMENT 0.25 MG/0.5ML ~~LOC~~ SOAJ
0.2500 mg | SUBCUTANEOUS | 0 refills | Status: AC
Start: 2023-06-12 — End: 2023-07-26
  Filled 2023-06-12 – 2023-06-28 (×2): qty 2, 28d supply, fill #0

## 2023-06-12 MED ORDER — METFORMIN HCL ER 500 MG PO TB24
500.0000 mg | ORAL_TABLET | Freq: Two times a day (BID) | ORAL | 0 refills | Status: DC
Start: 2023-06-12 — End: 2023-08-21

## 2023-06-12 MED ORDER — ERGOCALCIFEROL 1.25 MG (50000 UT) PO CAPS
50000.0000 [IU] | ORAL_CAPSULE | ORAL | 0 refills | Status: AC
Start: 2023-06-12 — End: ?

## 2023-06-12 NOTE — Assessment & Plan Note (Signed)
With history of gastric sleeve and metabolic adaptations.  She will continue to work on nutrition and behavioral strategies.  Her IC was higher than her calculated BMR which I feel may be over representing her caloric requirements.  She will track and journal 3 to 4 days out of the week and we will look at calories and protein intake and adjust calories and macronutrient composition.  Patient also benefits from incretin therapy.  After discussion of benefits and side effects she will be started on Wegovy 0.25 mg once a week

## 2023-06-12 NOTE — Assessment & Plan Note (Signed)
Most recent vitamin D levels  Lab Results  Component Value Date   VD25OH 13.9 (L) 11/22/2022     Deficiency state associated with adiposity and may result in leptin resistance, weight gain and fatigue. Currently on vitamin D supplementation without any adverse effects.  Plan: Continue high-dose vitamin D supplementation.  Check levels in June.

## 2023-06-12 NOTE — Progress Notes (Signed)
Office: [phone]  /  Fax: [phone]  WEIGHT SUMMARY AND BIOMETRICS  Vitals Temp: 97.7 F (36.5 C) BP: 134/87 Pulse Rate: 94 SpO2: 100 %   Anthropometric Measurements Height: 5\' 8"  (1.727 m) Weight: (!) 422 lb (191.4 kg) BMI (Calculated): 64.18 Weight at Last Visit: 426 lb Weight Lost Since Last Visit: 2 lb Weight Gained Since Last Visit: 0 lb Starting Weight: 421 lb Total Weight Loss (lbs): 0 lb (0 kg) Peak Weight: 546 lb   Body Composition  Body Fat %: 59.7 % Fat Mass (lbs): 252 lbs Muscle Mass (lbs): 161.6 lbs Visceral Fat Rating : 28    No data recorded Today's Visit #: 8  Starting Date: 11/22/22   HPI  Chief Complaint: OBESITY  Ashlee Gordon is here to discuss her progress with her obesity treatment plan. She is on the the Category 4 Plan and states she is following her eating plan approximately 35 % of the time. She states she is exercising 120 minutes 5 times per week.  Interval History:  Since last office visit she has lost 2 pounds.  Her BIA suggest a reduction in body fat percentage, increase in muscle mass and decrease in visceral fat rating.  She has increased physical activity.  She has questions regarding the timing of her meals as well as amount of protein intake.  Last office visit we had also prescribed Zepbound but medication is not covered by her insurance. She has been working on not skipping meals, increasing protein intake at every meal, eating more vegetables, drinking more water, continues to exercise, eating out less, reducing portion sizes, and controlling orexigenic cues and stimuli.  She has noticed improvement in energy level as well as changes in body composition.  She is noticing that close are fitting looser.  Orixegenic Control: Reports problems with appetite and hunger signals.  Denies problems with satiety and satiation.  Denies problems with eating patterns and portion control.  Reports abnormal cravings. Denies feeling  deprived or restricted.   Barriers identified: strong hunger signals and appetite and history of gastric bypass with metabolic adaptations. .   Pharmacotherapy for weight loss: She is currently taking Metformin (off label use for incretin effect and / or insulin resistance and / or diabetes prevention) with adequate clinical response  and without side effects..    ASSESSMENT AND PLAN  TREATMENT PLAN FOR OBESITY:  Recommended Dietary Goals  Mithra is currently in the action stage of change. As such, her goal is to continue weight management plan. She has agreed to: continue current plan.  We discussed starting today with a high protein breakfast to keep insulin levels down throughout the day.  This will help with cravings and appetite.  Behavioral Intervention  We discussed the following Behavioral Modification Strategies today: increasing lean protein intake, decreasing simple carbohydrates , increasing vegetables, increasing lower glycemic fruits, increasing fiber rich foods, increasing water intake, planning for success, and and .  Additional resources provided today: None  Recommended Physical Activity Goals  Talibah has been advised to work up to 150 minutes of moderate intensity aerobic activity a week and strengthening exercises 2-3 times per week for cardiovascular health, weight loss maintenance and preservation of muscle mass.   She has agreed to :  Continue current level of physical activity   Pharmacotherapy We discussed various medication options to help Norinne with her weight loss efforts and we both agreed to : start anti-obesity medication.  In addition to reduced calorie nutrition plan (RCNP), behavioral strategies  and physical activity, Frankie would benefit from pharmacotherapy to assist with hunger signals, satiety and cravings. This will reduce obesity-related health risks by inducing weight loss, and help reduce food consumption and adherence to [facility]) . It may also  improve QOL by improving self-confidence and reduce the  setbacks associated with metabolic adaptations from gastric bypass.  She will risk conditions including untreated OSA, hypertension, prediabetes and biomechanical problems.   After discussion of treatment options, mechanisms of action, benefits, side effects, contraindications and shared decision making she is agreeable to starting Wegovy 0.25 mg once a week.  Patient also made aware that medication is indicated for long-term management of obesity and the risk of weight regain following discontinuation of treatment and hence the importance of adhering to medical weight loss plan.  We demonstrated use of device and patient using teach back method was able to demonstrate proper technique.  ASSOCIATED CONDITIONS ADDRESSED TODAY  Class 3 severe obesity with serious comorbidity and body mass index (BMI) of 60.0 to 69.9 in adult, unspecified obesity type [facility]) Assessment & Plan: With history of gastric sleeve and metabolic adaptations.  She will continue to work on nutrition and behavioral strategies.  Her IC was higher than her calculated BMR which I feel may be over representing her caloric requirements.  She will track and journal 3 to 4 days out of the week and we will look at calories and protein intake and adjust calories and macronutrient composition.  Patient also benefits from incretin therapy.  After discussion of benefits and side effects she will be started on Wegovy 0.25 mg once a week  Orders: -     Semaglutide-Weight Management; Inject 0.25 mg into the skin once a week for 28 days.  Dispense: 2 mL; Refill: 0  Vitamin D deficiency Assessment & Plan: Most recent vitamin D levels  Lab Results  Component Value Date   VD25OH 13.9 (L) 11/22/2022     Deficiency state associated with adiposity and may result in leptin resistance, weight gain and fatigue. Currently on vitamin D supplementation without any adverse effects.  Plan: Continue  high-dose vitamin D supplementation.  Check levels in June.   Orders: -     Ergocalciferol; Take 1 capsule (50,000 Units total) by mouth once a week.  Dispense: 12 capsule; Refill: 0  Prediabetes Assessment & Plan: Most recent A1c is  Lab Results  Component Value Date   HGBA1C 6.1 (H) 11/22/2022  . Patient informed of disease state and risk of progression. This may contribute to abnormal cravings, fatigue and diabetes complications without having diabetes.   She is currently on metformin twice a day without any adverse effects.  She brought in several hemoglobin A1c's from the past from [facility] medical she has been as high as 6.3.  In addition to nutritional, behavioral strategies and metformin pharmacoprophylaxis she will benefit from incretin therapy for assistance with her complex obesity.   Orders: -     metFORMIN HCl ER; Take 1 tablet (500 mg total) by mouth 2 (two) times daily with a meal.  Dispense: 60 tablet; Refill: 0 -     Semaglutide-Weight Management; Inject 0.25 mg into the skin once a week for 28 days.  Dispense: 2 mL; Refill: 0  H/O gastric sleeve Assessment & Plan: She had gastric sleeve in 2015 her preoperative weight was 536 with a nadir of 418 and weight regain.  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  We had to talk about expectations and also although  difficulties on losing weight.  She has been overcoming a number of barriers.  We will continue with nutritional and behavioral strategies and patient education.  Orders: -     Semaglutide-Weight Management; Inject 0.25 mg into the skin once a week for 28 days.  Dispense: 2 mL; Refill: 0    PHYSICAL EXAM:  Blood pressure 134/87, pulse 94, temperature 97.7 F (36.5 C), height 5\' 8"  (1.727 m), weight (!) 422 lb (191.4 kg), last menstrual period 10/22/2020, SpO2 100%. Body mass index is 64.16 kg/m.  General: She is overweight, cooperative, alert, well developed, and in no  acute distress. PSYCH: Has normal mood, affect and thought process.   HEENT: EOMI, sclerae are anicteric. Lungs: Normal breathing effort, no conversational dyspnea. Extremities: No edema.  Neurologic: No gross sensory or motor deficits. No tremors or fasciculations noted.    DIAGNOSTIC DATA REVIEWED:  BMET    Component Value Date/Time   NA 141 11/22/2022 1016   K 4.1 11/22/2022 1016   CL 101 11/22/2022 1016   CO2 23 11/22/2022 1016   GLUCOSE 94 11/22/2022 1016   GLUCOSE 110 (H) 08/08/2022 1805   BUN 15 11/22/2022 1016   CREATININE 0.92 11/22/2022 1016   CALCIUM 9.0 11/22/2022 1016   GFRNONAA 36 (L) 08/08/2022 1805   Lab Results  Component Value Date   HGBA1C 6.1 (H) 11/22/2022   Lab Results  Component Value Date   INSULIN 37.9 (H) 11/22/2022   Lab Results  Component Value Date   TSH 1.220 11/22/2022   CBC    Component Value Date/Time   WBC 13.9 (H) 11/22/2022 1016   WBC 9.1 08/08/2022 1805   RBC 4.78 11/22/2022 1016   RBC 4.47 08/08/2022 1805   HGB 12.8 11/22/2022 1016   HCT 41.0 11/22/2022 1016   PLT 183 08/08/2022 1805   MCV 86 11/22/2022 1016   MCH 26.8 11/22/2022 1016   MCH 27.5 08/08/2022 1805   MCHC 31.2 (L) 11/22/2022 1016   MCHC 32.1 08/08/2022 1805   RDW 13.8 11/22/2022 1016   Iron Studies No results found for: "IRON", "TIBC", "FERRITIN", "IRONPCTSAT" Lipid Panel     Component Value Date/Time   CHOL 185 11/22/2022 1016   TRIG 74 11/22/2022 1016   HDL 54 11/22/2022 1016   LDLCALC 117 (H) 11/22/2022 1016   Hepatic Function Panel     Component Value Date/Time   PROT 7.5 11/22/2022 1016   ALBUMIN 4.0 11/22/2022 1016   AST 16 11/22/2022 1016   ALT 14 11/22/2022 1016   ALKPHOS 86 11/22/2022 1016   BILITOT 0.2 11/22/2022 1016      Component Value Date/Time   TSH 1.220 11/22/2022 1016   Nutritional Lab Results  Component Value Date   VD25OH 13.9 (L) 11/22/2022     Return in about 3 weeks (around 07/03/2023) for For Weight Mangement  with Dr. Rikki Spearing.Marland Kitchen She was informed of the importance of frequent follow up visits to maximize her success with intensive lifestyle modifications for her multiple health conditions.   ATTESTASTION STATEMENTS:  Reviewed by clinician on day of visit: allergies, medications, problem list, medical history, surgical history, family history, social history, and previous encounter notes.     Worthy Rancher, MD

## 2023-06-12 NOTE — Assessment & Plan Note (Signed)
 Most recent A1c is  Lab Results  Component Value Date   HGBA1C 6.1 (H) 11/22/2022  . Patient informed of disease state and risk of progression. This may contribute to abnormal cravings, fatigue and diabetes complications without having diabetes.   She is currently on metformin twice a day without any adverse effects.  She brought in several hemoglobin A1c's from the past from [facility] medical she has been as high as 6.3.  In addition to nutritional, behavioral strategies and metformin pharmacoprophylaxis she will benefit from incretin therapy for assistance with her complex obesity.

## 2023-06-12 NOTE — Assessment & Plan Note (Signed)
 She had gastric sleeve in 2015 her preoperative weight was 536 with a nadir of 418 and weight regain.  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  We had to talk about expectations and also although difficulties on losing weight.  She has been overcoming a number of barriers.  We will continue with nutritional and behavioral strategies and patient education.

## 2023-06-13 ENCOUNTER — Encounter: Payer: Self-pay | Admitting: Podiatry

## 2023-06-16 ENCOUNTER — Other Ambulatory Visit: Payer: Medicaid Other

## 2023-06-21 ENCOUNTER — Telehealth (INDEPENDENT_AMBULATORY_CARE_PROVIDER_SITE_OTHER): Payer: Self-pay

## 2023-06-21 NOTE — Telephone Encounter (Signed)
PA for Wegovy started 

## 2023-06-23 ENCOUNTER — Ambulatory Visit: Payer: Medicaid Other | Admitting: Podiatry

## 2023-06-27 ENCOUNTER — Telehealth (INDEPENDENT_AMBULATORY_CARE_PROVIDER_SITE_OTHER): Payer: Self-pay

## 2023-06-27 NOTE — Telephone Encounter (Signed)
PA was denied, called and case has been reopened Case # [id number]

## 2023-06-27 NOTE — Telephone Encounter (Signed)
PA denied.

## 2023-06-27 NOTE — Telephone Encounter (Signed)
PA

## 2023-06-27 NOTE — Telephone Encounter (Signed)
PA for [facility] approved, for 6 months,  and no other PA's are needed for dosage increase

## 2023-06-28 ENCOUNTER — Other Ambulatory Visit (HOSPITAL_COMMUNITY): Payer: Self-pay

## 2023-06-28 ENCOUNTER — Ambulatory Visit (INDEPENDENT_AMBULATORY_CARE_PROVIDER_SITE_OTHER): Payer: Medicaid Other | Admitting: Podiatry

## 2023-06-28 ENCOUNTER — Encounter: Payer: Self-pay | Admitting: Podiatry

## 2023-06-28 DIAGNOSIS — M7671 Peroneal tendinitis, right leg: Secondary | ICD-10-CM

## 2023-06-28 DIAGNOSIS — M722 Plantar fascial fibromatosis: Secondary | ICD-10-CM | POA: Diagnosis not present

## 2023-06-28 DIAGNOSIS — T148XXA Other injury of unspecified body region, initial encounter: Secondary | ICD-10-CM

## 2023-06-29 NOTE — Progress Notes (Signed)
Subjective:   Patient ID: Ashlee Gordon, female   DOB: 52 y.o.   MRN: [id number]   HPI Patient presents with a lot of pain in the right foot on the lateral side and apparently the MRI has not gone through yet with obesity is complicating factor   ROS      Objective:  Physical Exam  Neurovascular status intact with pain lateral side right foot localized with extreme obesity is complicating factor     Assessment:  Possibility for tear of the peroneal tendon right with obesity contributing     Plan:  H&P reviewed and I have recommended MRI and the importance of doing this and we will try to put this through will utilize ice at the current time and will be seen back for [place] to recheck as needed and when we get results of MRI with possibility for surgery

## 2023-07-05 ENCOUNTER — Telehealth: Payer: Self-pay

## 2023-07-05 NOTE — Telephone Encounter (Signed)
MRI prior authorization was denied because additional information that was requested on 06/13/2023 was not sent to insurance plan. The information they requested is below. The denial letter came in on 06/23/2023.   They are asking for:  results of xray findings  Proof of 4 weeks of stretches(such as physical therapy, chiropractic treatments, or medically directed home exercise program) within the lat 6 months.   Number of visits,date exercises done and documentation that patient didn't get better.

## 2023-07-24 ENCOUNTER — Other Ambulatory Visit (HOSPITAL_COMMUNITY): Payer: Self-pay

## 2023-07-24 ENCOUNTER — Encounter (INDEPENDENT_AMBULATORY_CARE_PROVIDER_SITE_OTHER): Payer: Self-pay | Admitting: Internal Medicine

## 2023-07-24 ENCOUNTER — Ambulatory Visit (INDEPENDENT_AMBULATORY_CARE_PROVIDER_SITE_OTHER): Payer: Medicaid Other | Admitting: Internal Medicine

## 2023-07-24 DIAGNOSIS — R7303 Prediabetes: Secondary | ICD-10-CM | POA: Diagnosis not present

## 2023-07-24 DIAGNOSIS — Z6841 Body Mass Index (BMI) 40.0 and over, adult: Secondary | ICD-10-CM

## 2023-07-24 DIAGNOSIS — E66813 Obesity, class 3: Secondary | ICD-10-CM

## 2023-07-24 DIAGNOSIS — Z903 Acquired absence of stomach [part of]: Secondary | ICD-10-CM

## 2023-07-24 DIAGNOSIS — E559 Vitamin D deficiency, unspecified: Secondary | ICD-10-CM

## 2023-07-24 MED ORDER — SEMAGLUTIDE-WEIGHT MANAGEMENT 0.5 MG/0.5ML ~~LOC~~ SOAJ
0.5000 mg | SUBCUTANEOUS | 0 refills | Status: DC
Start: 2023-08-22 — End: 2023-08-21
  Filled 2023-07-24: qty 2, 28d supply, fill #0

## 2023-07-24 MED ORDER — ERGOCALCIFEROL 1.25 MG (50000 UT) PO CAPS
50000.0000 [IU] | ORAL_CAPSULE | ORAL | 0 refills | Status: DC
  Filled 2023-07-24: qty 12, 84d supply, fill #0

## 2023-07-24 NOTE — Assessment & Plan Note (Signed)
Most recent vitamin D levels  Lab Results  Component Value Date   VD25OH 13.9 (L) 11/22/2022     Deficiency state associated with adiposity and may result in leptin resistance, weight gain and fatigue. Currently on vitamin D supplementation without any adverse effects.  Plan: Check vitamin D levels today transition to over-the-counter supplementation if adequate.

## 2023-07-24 NOTE — Assessment & Plan Note (Signed)
Most recent A1c is  Lab Results  Component Value Date   HGBA1C 6.1 (H) 11/22/2022  . Patient informed of disease state and risk of progression. This may contribute to abnormal cravings, fatigue and diabetes complications without having diabetes.   Patient would like to discontinue metformin at present time.  She would like to continue on Wegovy monotherapy for diabetes prevention and weight management.  Medication will be increased today we will also check a hemoglobin A1c

## 2023-07-24 NOTE — Assessment & Plan Note (Signed)
 See obesity treatment plan

## 2023-07-24 NOTE — Progress Notes (Signed)
Office: [phone]  /  Fax: [phone]  WEIGHT SUMMARY AND BIOMETRICS  Vitals Temp: 98.4 F (36.9 C) BP: (!) 140/80 Pulse Rate: 93 SpO2: 98 %   Anthropometric Measurements Height: 5\' 8"  (1.727 m) Weight: (!) 420 lb (190.5 kg) BMI (Calculated): 63.88 Weight at Last Visit: 422 lb Weight Lost Since Last Visit: 2 lb Weight Gained Since Last Visit: 0 lb Starting Weight: 421 lb Total Weight Loss (lbs): 1 lb (0.454 kg) Peak Weight: 546 lb   Body Composition  Body Fat %: 62.4 % Fat Mass (lbs): 262 lbs Muscle Mass (lbs): 150 lbs Visceral Fat Rating : 29    No data recorded Today's Visit #: 9  Starting Date: 11/22/22   HPI  Chief Complaint: OBESITY  Ashlee Gordon is here to discuss her progress with her obesity treatment plan. She is on the the Category 4 Plan and states she is following her eating plan approximately 40 % of the time. She states she is exercising 90 minutes 5 times per week. Discussed the use of AI scribe software for clinical note transcription with the patient, who gave verbal consent to proceed.  History of Present Illness          Ashlee Gordon, a patient with a history of obesity, prediabetes, and gastric sleeve surgery, presents for a follow-up visit. She has been on Wegovy 0.25mg  once a week and Metformin XR twice daily for diabetes prevention. Recently, she has been experiencing morning nausea, a decrease in appetite, and constipation. Despite these side effects, she reports feeling fuller quicker and having to remind herself to eat. She has been managing her constipation by increasing water intake and being more conscious of her bowel movements. She is also taking OTC remedies for this.  Has a vitamin D deficiency and is currently on a high dose of vitamin D.  In terms of lifestyle, Ashlee Gordon has been actively exercising, engaging in water aerobics and dance classes. She is also challenging herself with a 75-day fitness challenge that includes weight  training. Despite these efforts, she has only lost two pounds since her last visit.   Ashlee Gordon's blood pressure was slightly elevated during the visit, which she attributed to receiving bad news prior to the appointment.  Pharmacotherapy for weight loss: She is currently taking Wegovy with adequate clinical response  and experiencing the following side effects: Constipation..    ASSESSMENT AND PLAN  TREATMENT PLAN FOR OBESITY:  Recommended Dietary Goals  Ashlee Gordon is currently in the action stage of change. As such, her goal is to continue weight management plan. She has agreed to: continue current plan  Behavioral Intervention  We discussed the following Behavioral Modification Strategies today: increasing lean protein intake, decreasing simple carbohydrates , increasing vegetables, increasing lower glycemic fruits, increasing water intake, continue to practice mindfulness when eating, and planning for success.  Additional resources provided today: None  Recommended Physical Activity Goals  Ashlee Gordon has been advised to work up to 150 minutes of moderate intensity aerobic activity a week and strengthening exercises 2-3 times per week for cardiovascular health, weight loss maintenance and preservation of muscle mass.   She has agreed to :  Continue current level of physical activity   Pharmacotherapy We discussed various medication options to help Ashlee Gordon with her weight loss efforts and we both agreed to : increase Wegovy to 0.5 mg once a week  ASSOCIATED CONDITIONS ADDRESSED TODAY  Prediabetes Assessment & Plan: Most recent A1c is  Lab Results  Component Value Date   HGBA1C  6.1 (H) 11/22/2022  . Patient informed of disease state and risk of progression. This may contribute to abnormal cravings, fatigue and diabetes complications without having diabetes.   Patient would like to discontinue metformin at present time.  She would like to continue on Wegovy monotherapy for diabetes  prevention and weight management.  Medication will be increased today we will also check a hemoglobin A1c    Orders: -     Hemoglobin A1c  Class 3 severe obesity with serious comorbidity and body mass index (BMI) of 60.0 to 69.9 in adult, unspecified obesity type [facility]) Assessment & Plan: See obesity treatment plan  Orders: -     Semaglutide-Weight Management; Inject 0.5 mg into the skin once a week for 28 days.  Dispense: 2 mL; Refill: 0  H/O gastric sleeve Assessment & Plan: She had gastric sleeve in 2015 her preoperative weight was 536 with a nadir of 418 and weight regain.  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  We had to talk about expectations and also although difficulties on losing weight.  She has been overcoming a number of barriers.  We will continue with nutritional and behavioral strategies and patient education.   Vitamin D deficiency Assessment & Plan: Most recent vitamin D levels  Lab Results  Component Value Date   VD25OH 13.9 (L) 11/22/2022     Deficiency state associated with adiposity and may result in leptin resistance, weight gain and fatigue. Currently on vitamin D supplementation without any adverse effects.  Plan: Check vitamin D levels today transition to over-the-counter supplementation if adequate.   Orders: -     Ergocalciferol; Take 1 capsule (50,000 Units total) by mouth once a week.  Dispense: 12 capsule; Refill: 0 -     VITAMIN D 25 Hydroxy (Vit-D Deficiency, Fractures)    PHYSICAL EXAM:  Blood pressure (!) 140/80, pulse 93, temperature 98.4 F (36.9 C), height 5\' 8"  (1.727 m), weight (!) 420 lb (190.5 kg), last menstrual period 10/22/2020, SpO2 98%. Body mass index is 63.86 kg/m.  General: She is overweight, cooperative, alert, well developed, and in no acute distress. PSYCH: Has normal mood, affect and thought process.   HEENT: EOMI, sclerae are anicteric. Lungs: Normal breathing effort, no  conversational dyspnea. Extremities: No edema.  Neurologic: No gross sensory or motor deficits. No tremors or fasciculations noted.    DIAGNOSTIC DATA REVIEWED:  BMET    Component Value Date/Time   NA 141 11/22/2022 1016   K 4.1 11/22/2022 1016   CL 101 11/22/2022 1016   CO2 23 11/22/2022 1016   GLUCOSE 94 11/22/2022 1016   GLUCOSE 110 (H) 08/08/2022 1805   BUN 15 11/22/2022 1016   CREATININE 0.92 11/22/2022 1016   CALCIUM 9.0 11/22/2022 1016   GFRNONAA 36 (L) 08/08/2022 1805   Lab Results  Component Value Date   HGBA1C 6.1 (H) 11/22/2022   Lab Results  Component Value Date   INSULIN 37.9 (H) 11/22/2022   Lab Results  Component Value Date   TSH 1.220 11/22/2022   CBC    Component Value Date/Time   WBC 13.9 (H) 11/22/2022 1016   WBC 9.1 08/08/2022 1805   RBC 4.78 11/22/2022 1016   RBC 4.47 08/08/2022 1805   HGB 12.8 11/22/2022 1016   HCT 41.0 11/22/2022 1016   PLT 183 08/08/2022 1805   MCV 86 11/22/2022 1016   MCH 26.8 11/22/2022 1016   MCH 27.5 08/08/2022 1805   MCHC 31.2 (L) 11/22/2022 1016  MCHC 32.1 08/08/2022 1805   RDW 13.8 11/22/2022 1016   Iron Studies No results found for: "IRON", "TIBC", "FERRITIN", "IRONPCTSAT" Lipid Panel     Component Value Date/Time   CHOL 185 11/22/2022 1016   TRIG 74 11/22/2022 1016   HDL 54 11/22/2022 1016   LDLCALC 117 (H) 11/22/2022 1016   Hepatic Function Panel     Component Value Date/Time   PROT 7.5 11/22/2022 1016   ALBUMIN 4.0 11/22/2022 1016   AST 16 11/22/2022 1016   ALT 14 11/22/2022 1016   ALKPHOS 86 11/22/2022 1016   BILITOT 0.2 11/22/2022 1016      Component Value Date/Time   TSH 1.220 11/22/2022 1016   Nutritional Lab Results  Component Value Date   VD25OH 13.9 (L) 11/22/2022     Return in about 3 weeks (around 08/14/2023) for For Weight Mangement with Dr. Rikki Spearing.Marland Kitchen She was informed of the importance of frequent follow up visits to maximize her success with intensive lifestyle  modifications for her multiple health conditions.   ATTESTASTION STATEMENTS:  Reviewed by clinician on day of visit: allergies, medications, problem list, medical history, surgical history, family history, social history, and previous encounter notes.   I have spent 40 minutes in the care of the patient today including: preparing to see patient (e.g. review and interpretation of tests, old notes ), obtaining and/or reviewing separately obtained history, performing a medically appropriate examination or evaluation, counseling and educating the patient, ordering medications, test or procedures, documenting clinical information in the electronic or other health care record, and independently interpreting results and communicating results to the patient, family, or caregiver   Worthy Rancher, MD

## 2023-07-24 NOTE — Assessment & Plan Note (Signed)
 She had gastric sleeve in 2015 her preoperative weight was 536 with a nadir of 418 and weight regain.  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  We had to talk about expectations and also although difficulties on losing weight.  She has been overcoming a number of barriers.  We will continue with nutritional and behavioral strategies and patient education.

## 2023-07-25 LAB — VITAMIN D 25 HYDROXY (VIT D DEFICIENCY, FRACTURES): Vit D, 25-Hydroxy: 27.8 ng/mL — ABNORMAL LOW (ref 30.0–100.0)

## 2023-07-25 LAB — HEMOGLOBIN A1C
Est. average glucose Bld gHb Est-mCnc: 128 mg/dL
Hgb A1c MFr Bld: 6.1 % — ABNORMAL HIGH (ref 4.8–5.6)

## 2023-08-06 ENCOUNTER — Other Ambulatory Visit: Payer: Medicaid Other

## 2023-08-07 ENCOUNTER — Telehealth: Payer: Self-pay

## 2023-08-07 NOTE — Telephone Encounter (Signed)
Patient called about PA for Mri. I sent in additional,but they did not accept it. The case can be appealed until Oct. 29,2024. There is a P2P that can be done(according to original denial letter from August 20) The P2P number is [phone].   Tracking Number [id number] Member Id# [id number]

## 2023-08-21 ENCOUNTER — Other Ambulatory Visit (HOSPITAL_COMMUNITY): Payer: Self-pay

## 2023-08-21 ENCOUNTER — Ambulatory Visit (INDEPENDENT_AMBULATORY_CARE_PROVIDER_SITE_OTHER): Payer: Medicaid Other | Admitting: Internal Medicine

## 2023-08-21 ENCOUNTER — Encounter (INDEPENDENT_AMBULATORY_CARE_PROVIDER_SITE_OTHER): Payer: Self-pay | Admitting: Internal Medicine

## 2023-08-21 VITALS — BP 168/106 | HR 85 | Temp 98.9°F | Ht 68.0 in | Wt >= 6400 oz

## 2023-08-21 DIAGNOSIS — R7303 Prediabetes: Secondary | ICD-10-CM

## 2023-08-21 DIAGNOSIS — Z6841 Body Mass Index (BMI) 40.0 and over, adult: Secondary | ICD-10-CM

## 2023-08-21 DIAGNOSIS — G4733 Obstructive sleep apnea (adult) (pediatric): Secondary | ICD-10-CM | POA: Diagnosis not present

## 2023-08-21 DIAGNOSIS — E66813 Obesity, class 3: Secondary | ICD-10-CM

## 2023-08-21 DIAGNOSIS — Z903 Acquired absence of stomach [part of]: Secondary | ICD-10-CM

## 2023-08-21 DIAGNOSIS — I10 Essential (primary) hypertension: Secondary | ICD-10-CM

## 2023-08-21 MED ORDER — SEMAGLUTIDE-WEIGHT MANAGEMENT 1 MG/0.5ML ~~LOC~~ SOAJ
1.0000 mg | SUBCUTANEOUS | 0 refills | Status: DC
  Filled 2023-08-21: qty 2, 28d supply, fill #0

## 2023-08-21 NOTE — Assessment & Plan Note (Signed)
 See obesity treatment plan

## 2023-08-21 NOTE — Assessment & Plan Note (Signed)
 She had gastric sleeve in 2015 her preoperative weight was 536 with a nadir of 418 and weight regain.  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  We had to talk about expectations and also although difficulties on losing weight.  She has been overcoming a number of barriers.  We will continue with nutritional and behavioral strategies and patient education.

## 2023-08-21 NOTE — Assessment & Plan Note (Addendum)
Uncontrolled. Has not taken medications today. Counsled on imprtance of adherence and risk of uncontrolled BP.  She will take medications when she gets home I also recommend that she begin to monitor blood pressures and to work with her primary care team to intensify therapy.

## 2023-08-21 NOTE — Assessment & Plan Note (Signed)
Most recent A1c is  Lab Results  Component Value Date   HGBA1C 6.1 (H) 07/24/2023  . Patient informed of disease state and risk of progression. This may contribute to abnormal cravings, fatigue and diabetes complications without having diabetes.   On Wegovy for pharmacoprophylaxis.  Medication will be increased to 1 mg once a week

## 2023-08-21 NOTE — Assessment & Plan Note (Addendum)
Untreated.  Intolerance to CPAP.  May affect weight and blood pressure control.  Counseled on risk associated with untreated sleep apnea.  Losing 15% of body weight may improve condition.

## 2023-08-21 NOTE — Progress Notes (Signed)
Office: [phone]  /  Fax: [phone]  WEIGHT SUMMARY AND BIOMETRICS  Vitals Temp: 98.9 F (37.2 C) BP: (!) 168/106 Pulse Rate: 85 SpO2: 96 %   Anthropometric Measurements Height: 5\' 8"  (1.727 m) Weight: (!) 417 lb (189.1 kg) BMI (Calculated): 63.42 Weight at Last Visit: 420 lb Weight Lost Since Last Visit: 3 lb Weight Gained Since Last Visit: 0 lb Starting Weight: 421 lb Total Weight Loss (lbs): 4 lb (1.814 kg) Peak Weight: 546 lb   Body Composition  Body Fat %: 61.7 % Fat Mass (lbs): 257.2 lbs Muscle Mass (lbs): 151.8 lbs Visceral Fat Rating : 28    No data recorded Today's Visit #: 10  Starting Date: 11/22/22   HPI  Chief Complaint: OBESITY  Ashlee Gordon is here to discuss her progress with her obesity treatment plan. She is on the the Category 4 Plan and states she is following her eating plan approximately 35 % of the time. She states she is exercising 90 minutes 5 times per week.  Interval History:  Since last office visit she has lost 3 pounds. She reports variable adherence to reduced calorie nutritional plan She has been working on not skipping meals, increasing protein intake at every meal, drinking more water, continues to exercise, and reducing portion sizes  Orexigenic Control: Denies problems with appetite and hunger signals.  Denies problems with satiety and satiation.  Denies problems with eating patterns and portion control.  Denies abnormal cravings. Denies feeling deprived or restricted.   Barriers identified:  history of gastric bypass .   Pharmacotherapy for weight loss: She is currently taking Wegovy with adequate clinical response  and experiencing the following side effects: mild nausea..    ASSESSMENT AND PLAN  TREATMENT PLAN FOR OBESITY:  Recommended Dietary Goals  Ashlee Gordon is currently in the action stage of change. As such, her goal is to continue weight management plan. She has agreed to: continue to work on  implementation of reduced calorie nutrition plan ([facility])  Behavioral Intervention  We discussed the following Behavioral Modification Strategies today: continue to work on maintaining a reduced calorie state, getting the recommended amount of protein, incorporating whole foods, making healthy choices, staying well hydrated and practicing mindfulness when eating..  Additional resources provided today: None  Recommended Physical Activity Goals  Ashlee Gordon has been advised to work up to 150 minutes of moderate intensity aerobic activity a week and strengthening exercises 2-3 times per week for cardiovascular health, weight loss maintenance and preservation of muscle mass.   She has agreed to :  Increase volume of PA  Pharmacotherapy We discussed various medication options to help Ashlee Gordon with her weight loss efforts and we both agreed to : increase wegovy to 1 mg once a week  ASSOCIATED CONDITIONS ADDRESSED TODAY  H/O gastric sleeve Assessment & Plan: She had gastric sleeve in 2015 her preoperative weight was 536 with a nadir of 418 and weight regain.  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  We had to talk about expectations and also although difficulties on losing weight.  She has been overcoming a number of barriers.  We will continue with nutritional and behavioral strategies and patient education.   Class 3 severe obesity with serious comorbidity and body mass index (BMI) of 60.0 to 69.9 in adult, unspecified obesity type [facility]) Assessment & Plan: See obesity treatment plan   Prediabetes Assessment & Plan: Most recent A1c is  Lab Results  Component Value Date   HGBA1C 6.1 (H) 07/24/2023  .  Patient informed of disease state and risk of progression. This may contribute to abnormal cravings, fatigue and diabetes complications without having diabetes.   On Wegovy for pharmacoprophylaxis.  Medication will be increased to 1 mg once a week     OSA  (obstructive sleep apnea) Assessment & Plan: Untreated.  Intolerance to CPAP.  May affect weight and blood pressure control.  Counseled on risk associated with untreated sleep apnea.  Losing 15% of body weight may improve condition.     Primary hypertension Assessment & Plan: Uncontrolled. Has not taken medications today. Counsled on imprtance of adherence and risk of uncontrolled BP.  She will take medications when she gets home I also recommend that she begin to monitor blood pressures and to work with her primary care team to intensify therapy.   Other orders -     Semaglutide-Weight Management; Inject 1 mg into the skin once a week for 28 days.  Dispense: 2 mL; Refill: 0    PHYSICAL EXAM:  Blood pressure (!) 168/106, pulse 85, temperature 98.9 F (37.2 C), height 5\' 8"  (1.727 m), weight (!) 417 lb (189.1 kg), last menstrual period 10/22/2020, SpO2 96%. Body mass index is 63.4 kg/m.  General: She is overweight, cooperative, alert, well developed, and in no acute distress. PSYCH: Has normal mood, affect and thought process.   HEENT: EOMI, sclerae are anicteric. Lungs: Normal breathing effort, no conversational dyspnea. Extremities: No edema.  Neurologic: No gross sensory or motor deficits. No tremors or fasciculations noted.    DIAGNOSTIC DATA REVIEWED:  BMET    Component Value Date/Time   NA 141 11/22/2022 1016   K 4.1 11/22/2022 1016   CL 101 11/22/2022 1016   CO2 23 11/22/2022 1016   GLUCOSE 94 11/22/2022 1016   GLUCOSE 110 (H) 08/08/2022 1805   BUN 15 11/22/2022 1016   CREATININE 0.92 11/22/2022 1016   CALCIUM 9.0 11/22/2022 1016   GFRNONAA 36 (L) 08/08/2022 1805   Lab Results  Component Value Date   HGBA1C 6.1 (H) 07/24/2023   HGBA1C 6.1 (H) 11/22/2022   Lab Results  Component Value Date   INSULIN 37.9 (H) 11/22/2022   Lab Results  Component Value Date   TSH 1.220 11/22/2022   CBC    Component Value Date/Time   WBC 13.9 (H) 11/22/2022 1016   WBC  9.1 08/08/2022 1805   RBC 4.78 11/22/2022 1016   RBC 4.47 08/08/2022 1805   HGB 12.8 11/22/2022 1016   HCT 41.0 11/22/2022 1016   PLT 183 08/08/2022 1805   MCV 86 11/22/2022 1016   MCH 26.8 11/22/2022 1016   MCH 27.5 08/08/2022 1805   MCHC 31.2 (L) 11/22/2022 1016   MCHC 32.1 08/08/2022 1805   RDW 13.8 11/22/2022 1016   Iron Studies No results found for: "IRON", "TIBC", "FERRITIN", "IRONPCTSAT" Lipid Panel     Component Value Date/Time   CHOL 185 11/22/2022 1016   TRIG 74 11/22/2022 1016   HDL 54 11/22/2022 1016   LDLCALC 117 (H) 11/22/2022 1016   Hepatic Function Panel     Component Value Date/Time   PROT 7.5 11/22/2022 1016   ALBUMIN 4.0 11/22/2022 1016   AST 16 11/22/2022 1016   ALT 14 11/22/2022 1016   ALKPHOS 86 11/22/2022 1016   BILITOT 0.2 11/22/2022 1016      Component Value Date/Time   TSH 1.220 11/22/2022 1016   Nutritional Lab Results  Component Value Date   VD25OH 27.8 (L) 07/24/2023   VD25OH 13.9 (L) 11/22/2022  Return in about 3 weeks (around 09/11/2023) for For Weight Mangement with Dr. Rikki Spearing.Marland Kitchen She was informed of the importance of frequent follow up visits to maximize her success with intensive lifestyle modifications for her multiple health conditions.   ATTESTASTION STATEMENTS:  Reviewed by clinician on day of visit: allergies, medications, problem list, medical history, surgical history, family history, social history, and previous encounter notes.     Worthy Rancher, MD

## 2023-09-18 ENCOUNTER — Ambulatory Visit (INDEPENDENT_AMBULATORY_CARE_PROVIDER_SITE_OTHER): Payer: Medicaid Other | Admitting: Internal Medicine

## 2023-09-18 ENCOUNTER — Encounter (INDEPENDENT_AMBULATORY_CARE_PROVIDER_SITE_OTHER): Payer: Self-pay | Admitting: Internal Medicine

## 2023-09-18 ENCOUNTER — Other Ambulatory Visit (HOSPITAL_COMMUNITY): Payer: Self-pay

## 2023-09-18 VITALS — BP 159/92 | HR 84 | Temp 98.6°F | Ht 68.0 in | Wt >= 6400 oz

## 2023-09-18 DIAGNOSIS — E66813 Obesity, class 3: Secondary | ICD-10-CM

## 2023-09-18 DIAGNOSIS — G4733 Obstructive sleep apnea (adult) (pediatric): Secondary | ICD-10-CM | POA: Diagnosis not present

## 2023-09-18 DIAGNOSIS — I10 Essential (primary) hypertension: Secondary | ICD-10-CM

## 2023-09-18 DIAGNOSIS — R7303 Prediabetes: Secondary | ICD-10-CM

## 2023-09-18 DIAGNOSIS — Z903 Acquired absence of stomach [part of]: Secondary | ICD-10-CM

## 2023-09-18 DIAGNOSIS — Z6841 Body Mass Index (BMI) 40.0 and over, adult: Secondary | ICD-10-CM

## 2023-09-18 MED ORDER — SEMAGLUTIDE-WEIGHT MANAGEMENT 1 MG/0.5ML ~~LOC~~ SOAJ
1.0000 mg | SUBCUTANEOUS | 0 refills | Status: AC
  Filled 2023-09-18: qty 2, 28d supply, fill #0

## 2023-09-18 NOTE — Assessment & Plan Note (Signed)
Most recent A1c is  Lab Results  Component Value Date   HGBA1C 6.1 (H) 07/24/2023  . Patient informed of disease state and risk of progression. This may contribute to abnormal cravings, fatigue and diabetes complications without having diabetes.   On Wegovy for pharmacoprophylaxis.  Continue medication at current dose.

## 2023-09-18 NOTE — Assessment & Plan Note (Signed)
Uncontrolled.  Adherence with medications have declined.  There is also history of inconsistent intake.  She had run out of hydrochlorothiazide and has a follow-up with her PCP tomorrow.  Counsled on importance of adherence and risk of uncontrolled BP.  She will take medications when she gets home I also recommend that she begin to monitor blood pressures and to work with her primary care team to intensify therapy.

## 2023-09-18 NOTE — Assessment & Plan Note (Signed)
Untreated.  Intolerance to CPAP.  May affect weight and blood pressure control.  Counseled on risk associated with untreated sleep apnea.  Losing 15% of body weight may improve condition.

## 2023-09-18 NOTE — Assessment & Plan Note (Signed)
She had gastric sleeve in 2015 her preoperative weight was 536 with a nadir of 418 and weight regain.  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  We had to talk about expectations and also although difficulties on losing weight.  She has been overcoming a number of barriers.  We will continue with nutritional and behavioral strategies and patient education.

## 2023-09-18 NOTE — Assessment & Plan Note (Signed)
 See obesity treatment plan

## 2023-09-18 NOTE — Progress Notes (Signed)
Office: [phone]  /  Fax: [phone]  Weight Summary And Biometrics  Vitals Temp: 98.6 F (37 C) BP: (!) 159/92 Pulse Rate: 84 SpO2: 99 %   Anthropometric Measurements Height: 5\' 8"  (1.727 m) Weight: (!) 413 lb (187.3 kg) BMI (Calculated): 62.81 Weight at Last Visit: 417 lb Weight Lost Since Last Visit: 4 lb Weight Gained Since Last Visit: 4 lb Starting Weight: 421 lb Total Weight Loss (lbs): 8 lb (3.629 kg) Peak Weight: 546 lb   Body Composition  Body Fat %: 61.1 % Fat Mass (lbs): 252.8 lbs Muscle Mass (lbs): 152.8 lbs Visceral Fat Rating : 28    No data recorded Today's Visit #: 11  Starting Date: 11/22/22   Subjective   Chief Complaint: Obesity  Ashlee Gordon is here to discuss her progress with her obesity treatment plan. She is on the the Category 4 Plan and states she is following her eating plan approximately 30 % of the time. She states she is exercising 120 minutes 4 times per week.  Interval History:   Discussed the use of AI scribe software for clinical note transcription with the patient, who gave verbal consent to proceed.  History of Present Illness   The patient, affected by obesity with a history of gastric sleeve surgery and prediabetes, presents for a weight management consultation. She reports a weight loss of four pounds since the last visit and is currently on Wegovy 1mg  once a week. However, she admits to only adhering to her reduced calorie nutrient plan 30% of the time. She also reports exercising for 120 minutes four times per week.  The patient's blood pressure was noted to be high, which she was unaware of. She has an upcoming appointment with her primary care provider.  She acknowledges not following the plan due to stressful situations at home.  She expresses confidence in her ability to improve her dietary habits and continue her aqua aerobics and dance exercises.  The patient also reports issues with her sleep CPAP machine,  which she stopped using due to difficulty breathing while asleep.     Orexigenic Control:  Denies problems with appetite and hunger signals.  Denies problems with satiety and satiation.  Denies problems with eating patterns and portion control.  Denies abnormal cravings. Denies feeling deprived or restricted.   Barriers identified:  Metabolic adaptations associated with gastric bypass .   Pharmacotherapy for weight loss: She is currently taking Wegovy with adequate clinical response  and without side effects..   Assessment and Plan   Treatment Plan For Obesity:  Recommended Dietary Goals  Ashlee Gordon is currently in the action stage of change. As such, her goal is to continue weight management plan. She has agreed to: continue current plan  Behavioral Intervention  We discussed the following Behavioral Modification Strategies today: continue to work on maintaining a reduced calorie state, getting the recommended amount of protein, incorporating whole foods, making healthy choices, staying well hydrated and practicing mindfulness when eating..  Additional resources provided today: None  Recommended Physical Activity Goals  Ashlee Gordon has been advised to work up to 150 minutes of moderate intensity aerobic activity a week and strengthening exercises 2-3 times per week for cardiovascular health, weight loss maintenance and preservation of muscle mass.   She has agreed to :  Think about enjoyable ways to increase daily physical activity and overcoming barriers to exercise and Increase physical activity in their day and reduce sedentary time (increase NEAT).  Pharmacotherapy  We discussed various medication options  to help Ashlee Gordon with her weight loss efforts and we both agreed to : continue with nutritional and behavioral strategies  Associated Conditions Addressed Today  Primary hypertension Assessment & Plan: Uncontrolled.  Adherence with medications have declined.  There is also  history of inconsistent intake.  She had run out of hydrochlorothiazide and has a follow-up with her PCP tomorrow.  Counsled on importance of adherence and risk of uncontrolled BP.  She will take medications when she gets home I also recommend that she begin to monitor blood pressures and to work with her primary care team to intensify therapy.   OSA (obstructive sleep apnea) untreated, intolerance to CPAP Assessment & Plan: Untreated.  Intolerance to CPAP.  May affect weight and blood pressure control.  Counseled on risk associated with untreated sleep apnea.  Losing 15% of body weight may improve condition.     Class 3 severe obesity with serious comorbidity and body mass index (BMI) of 60.0 to 69.9 in adult, unspecified obesity type [facility]) Assessment & Plan: See obesity treatment plan   H/O gastric sleeve Assessment & Plan: She had gastric sleeve in 2015 her preoperative weight was 536 with a nadir of 418 and weight regain.  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  We had to talk about expectations and also although difficulties on losing weight.  She has been overcoming a number of barriers.  We will continue with nutritional and behavioral strategies and patient education.   Prediabetes Assessment & Plan: Most recent A1c is  Lab Results  Component Value Date   HGBA1C 6.1 (H) 07/24/2023  . Patient informed of disease state and risk of progression. This may contribute to abnormal cravings, fatigue and diabetes complications without having diabetes.   On Wegovy for pharmacoprophylaxis.  Continue medication at current dose.     Other orders -     Semaglutide-Weight Management; Inject 1 mg into the skin once a week  Dispense: 2 mL; Refill: 0          Objective   Physical Exam:  Blood pressure (!) 159/92, pulse 84, temperature 98.6 F (37 C), height 5\' 8"  (1.727 m), weight (!) 413 lb (187.3 kg), last menstrual period 10/22/2020, SpO2  99%. Body mass index is 62.8 kg/m.  General: She is overweight, cooperative, alert, well developed, and in no acute distress. PSYCH: Has normal mood, affect and thought process.   HEENT: EOMI, sclerae are anicteric. Lungs: Normal breathing effort, no conversational dyspnea. Extremities: No edema.  Neurologic: No gross sensory or motor deficits. No tremors or fasciculations noted.    Diagnostic Data Reviewed:  BMET    Component Value Date/Time   NA 141 11/22/2022 1016   K 4.1 11/22/2022 1016   CL 101 11/22/2022 1016   CO2 23 11/22/2022 1016   GLUCOSE 94 11/22/2022 1016   GLUCOSE 110 (H) 08/08/2022 1805   BUN 15 11/22/2022 1016   CREATININE 0.92 11/22/2022 1016   CALCIUM 9.0 11/22/2022 1016   GFRNONAA 36 (L) 08/08/2022 1805   Lab Results  Component Value Date   HGBA1C 6.1 (H) 07/24/2023   HGBA1C 6.1 (H) 11/22/2022   Lab Results  Component Value Date   INSULIN 37.9 (H) 11/22/2022   Lab Results  Component Value Date   TSH 1.220 11/22/2022   CBC    Component Value Date/Time   WBC 13.9 (H) 11/22/2022 1016   WBC 9.1 08/08/2022 1805   RBC 4.78 11/22/2022 1016   RBC 4.47 08/08/2022 1805  HGB 12.8 11/22/2022 1016   HCT 41.0 11/22/2022 1016   PLT 183 08/08/2022 1805   MCV 86 11/22/2022 1016   MCH 26.8 11/22/2022 1016   MCH 27.5 08/08/2022 1805   MCHC 31.2 (L) 11/22/2022 1016   MCHC 32.1 08/08/2022 1805   RDW 13.8 11/22/2022 1016   Iron Studies No results found for: "IRON", "TIBC", "FERRITIN", "IRONPCTSAT" Lipid Panel     Component Value Date/Time   CHOL 185 11/22/2022 1016   TRIG 74 11/22/2022 1016   HDL 54 11/22/2022 1016   LDLCALC 117 (H) 11/22/2022 1016   Hepatic Function Panel     Component Value Date/Time   PROT 7.5 11/22/2022 1016   ALBUMIN 4.0 11/22/2022 1016   AST 16 11/22/2022 1016   ALT 14 11/22/2022 1016   ALKPHOS 86 11/22/2022 1016   BILITOT 0.2 11/22/2022 1016      Component Value Date/Time   TSH 1.220 11/22/2022 1016    Nutritional Lab Results  Component Value Date   VD25OH 27.8 (L) 07/24/2023   VD25OH 13.9 (L) 11/22/2022    Follow-Up   Return in about 4 weeks (around 10/16/2023) for For Weight Mangement with Dr. Rikki Spearing.Marland Kitchen She was informed of the importance of frequent follow up visits to maximize her success with intensive lifestyle modifications for her multiple health conditions.  Attestation Statement   Reviewed by clinician on day of visit: allergies, medications, problem list, medical history, surgical history, family history, social history, and previous encounter notes.     Worthy Rancher, MD

## 2023-09-20 ENCOUNTER — Other Ambulatory Visit (HOSPITAL_COMMUNITY): Payer: Self-pay

## 2023-10-24 ENCOUNTER — Ambulatory Visit (INDEPENDENT_AMBULATORY_CARE_PROVIDER_SITE_OTHER): Payer: Medicaid Other | Admitting: Internal Medicine

## 2023-10-24 ENCOUNTER — Encounter (INDEPENDENT_AMBULATORY_CARE_PROVIDER_SITE_OTHER): Payer: Self-pay | Admitting: Internal Medicine

## 2023-10-24 ENCOUNTER — Other Ambulatory Visit: Payer: Self-pay

## 2023-10-24 ENCOUNTER — Other Ambulatory Visit (HOSPITAL_COMMUNITY): Payer: Self-pay

## 2023-10-24 ENCOUNTER — Other Ambulatory Visit (INDEPENDENT_AMBULATORY_CARE_PROVIDER_SITE_OTHER): Payer: Self-pay | Admitting: Internal Medicine

## 2023-10-24 VITALS — BP 155/91 | HR 82 | Temp 98.4°F | Ht 68.0 in | Wt >= 6400 oz

## 2023-10-24 DIAGNOSIS — Z903 Acquired absence of stomach [part of]: Secondary | ICD-10-CM | POA: Diagnosis not present

## 2023-10-24 DIAGNOSIS — I10 Essential (primary) hypertension: Secondary | ICD-10-CM

## 2023-10-24 DIAGNOSIS — G4733 Obstructive sleep apnea (adult) (pediatric): Secondary | ICD-10-CM

## 2023-10-24 DIAGNOSIS — E559 Vitamin D deficiency, unspecified: Secondary | ICD-10-CM

## 2023-10-24 DIAGNOSIS — R7303 Prediabetes: Secondary | ICD-10-CM

## 2023-10-24 DIAGNOSIS — E66813 Obesity, class 3: Secondary | ICD-10-CM

## 2023-10-24 DIAGNOSIS — Z6841 Body Mass Index (BMI) 40.0 and over, adult: Secondary | ICD-10-CM

## 2023-10-24 MED ORDER — SEMAGLUTIDE-WEIGHT MANAGEMENT 1.7 MG/0.75ML ~~LOC~~ SOAJ
1.7000 mg | SUBCUTANEOUS | 0 refills | Status: DC
  Filled 2023-10-24: qty 3, 28d supply, fill #0

## 2023-10-24 MED ORDER — SEMAGLUTIDE-WEIGHT MANAGEMENT 1.7 MG/0.75ML ~~LOC~~ SOAJ
1.7000 mg | SUBCUTANEOUS | 0 refills | Status: AC
  Filled 2023-10-24: qty 3, 28d supply, fill #0

## 2023-10-24 MED ORDER — NEBIVOLOL HCL 2.5 MG PO TABS
2.5000 mg | ORAL_TABLET | Freq: Every evening | ORAL | 0 refills | Status: DC
  Filled 2023-10-24: qty 30, 30d supply, fill #0

## 2023-10-24 MED ORDER — VITAMIN D3 50 MCG (2000 UT) PO CAPS: 2000.0000 [IU] | ORAL_CAPSULE | Freq: Every day | ORAL | Status: DC

## 2023-10-24 MED ORDER — SEMAGLUTIDE-WEIGHT MANAGEMENT 1.7 MG/0.75ML ~~LOC~~ SOAJ: 1.7000 mg | SUBCUTANEOUS | 0 refills | Status: DC

## 2023-10-24 NOTE — Progress Notes (Signed)
 Office: [phone]  /  Fax: [phone]  Weight Summary And Biometrics  Vitals Temp: 98.4 F (36.9 C) BP: (!) 155/91 Pulse Rate: 82 SpO2: 100 %   Anthropometric Measurements Height: 5' 8 (1.727 m) Weight: (!) 406 lb (184.2 kg) BMI (Calculated): 61.75 Weight at Last Visit: 413 lb Weight Lost Since Last Visit: 7 lb Weight Gained Since Last Visit: 0 Starting Weight: 421 lb Total Weight Loss (lbs): 15 lb (6.804 kg) Peak Weight: 546 lb   Body Composition  Body Fat %: 59.8 % Fat Mass (lbs): 243.2 lbs Muscle Mass (lbs): 155.4 lbs Visceral Fat Rating : 27    No data recorded Today's Visit #: 12  Starting Date: 11/22/22   Subjective   Chief Complaint: Obesity  Ashlee Gordon is here to discuss her progress with her obesity treatment plan. She is on the the Category 4 Plan and states she is following her eating plan approximately 35% of the time. She states she has not been exercising because of the weather  Interval History:   Since last office visit she has lost 7 pounds. She reports good adherence to reduced calorie nutritional plan. She has been working on reading food labels, not skipping meals, increasing protein intake at every meal, drinking more water, making healthier choices, reducing portion sizes, and incorporating more whole foods    Orexigenic Control:  Reports improved problems with appetite and hunger signals.  Reports improved problems with satiety and satiation.  Reports improved problems with eating patterns and portion control.  Denies abnormal cravings. Denies feeling deprived or restricted.   Barriers identified: low volume of physical activity at present , sleep apnea, and history of gastric sleeve .   Pharmacotherapy for weight loss: She is currently taking Wegovy  with adequate clinical response  and without side effects..   Assessment and Plan   Treatment Plan For Obesity:  Recommended Dietary Goals  Ashlee Gordon is currently in the  action stage of change. As such, her goal is to continue weight management plan. She has agreed to: continue current plan  Behavioral Intervention  We discussed the following Behavioral Modification Strategies today: continue to work on maintaining a reduced calorie state, getting the recommended amount of protein, incorporating whole foods, making healthy choices, staying well hydrated and practicing mindfulness when eating..  Additional resources provided today: None  Recommended Physical Activity Goals  Ashlee Gordon has been advised to work up to 150 minutes of moderate intensity aerobic activity a week and strengthening exercises 2-3 times per week for cardiovascular health, weight loss maintenance and preservation of muscle mass.   She has agreed to :  Do virtual or YouTube videos due to inclement weather  Pharmacotherapy  We discussed various medication options to help Ashlee Gordon with her weight loss efforts and we both agreed to : increase Wegovy  to 1.7 mg once a week  Associated Conditions Addressed and Impacted by Obesity Treatment  OSA (obstructive sleep apnea) Assessment & Plan: Untreated.  Intolerance to CPAP.  May affect weight and blood pressure control.  Counseled on risk associated with untreated sleep apnea.  Losing 15% of body weight may improve condition.  Continue GLP-1 therapy and medical weight loss.  Orders: -     Semaglutide -Weight Management; Inject 1.7 mg into the skin once a week for 28 days.  Dispense: 3 mL; Refill: 0  Vitamin D  deficiency Assessment & Plan: Her vitamin D  levels have recently doubled we will therefore discontinue high-dose vitamin D  supplementation and transition to over-the-counter D3 2000 international units  daily.  Orders: -     Vitamin D3; Take 1 capsule (2,000 Units total) by mouth daily.  Class 3 severe obesity with serious comorbidity and body mass index (BMI) of 60.0 to 69.9 in adult, unspecified obesity type [facility]) Assessment &  Plan: Ashlee Gordon continues to make steady progress she lost 7 pounds and is having a good response to GLP-1 therapy without any adverse effects.  We will increase Wegovy  to 1.7 mg once a week for maintenance.  She has been increasing her protein intake and although physical activity levels have decreased we discussed other options due to weather.  See obesity treatment plan  Orders: -     Semaglutide -Weight Management; Inject 1.7 mg into the skin once a week for 28 days.  Dispense: 3 mL; Refill: 0  H/O gastric sleeve Assessment & Plan: She had gastric sleeve in 2015 her preoperative weight was 536 with a nadir of 418 and weight regain.  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  Patient has initiated a weight loss trend now on GLP-1 therapy.  Orders: -     Semaglutide -Weight Management; Inject 1.7 mg into the skin once a week for 28 days.  Dispense: 3 mL; Refill: 0  Prediabetes Assessment & Plan: Most recent A1c is  Lab Results  Component Value Date   HGBA1C 6.1 (H) 07/24/2023  . Patient informed of disease state and risk of progression. This may contribute to abnormal cravings, fatigue and diabetes complications without having diabetes.   On Wegovy  for pharmacoprophylaxis.  Medication will be increased to 1.7 mg once a week    Orders: -     Semaglutide -Weight Management; Inject 1.7 mg into the skin once a week for 28 days.  Dispense: 3 mL; Refill: 0  Primary hypertension Assessment & Plan: Patient had some medication changes done by primary care team.  She did not tolerate combination losartan hydrochlorothiazide therefore medication was discontinued.  She is now on losartan 100 mg a day and amlodipine 10 mg daily she is taking her blood pressure medications in the morning obviously not lasting the entire day.  After discussion of benefits and side effect she will be started on Bystolic  2.5 mg in the evening for better 24-hour control.  Orders: -      Nebivolol  HCl; Take 1 tablet (2.5 mg total) by mouth at bedtime.  Dispense: 30 tablet; Refill: 0     Objective   Physical Exam:  Blood pressure (!) 155/91, pulse 82, temperature 98.4 F (36.9 C), height 5' 8 (1.727 m), weight (!) 406 lb (184.2 kg), last menstrual period 10/22/2020, SpO2 100%. Body mass index is 61.73 kg/m.  General: She is overweight, cooperative, alert, well developed, and in no acute distress. PSYCH: Has normal mood, affect and thought process.   HEENT: EOMI, sclerae are anicteric. Lungs: Normal breathing effort, no conversational dyspnea. Extremities: No edema.  Neurologic: No gross sensory or motor deficits. No tremors or fasciculations noted.    Diagnostic Data Reviewed:  BMET    Component Value Date/Time   NA 141 11/22/2022 1016   K 4.1 11/22/2022 1016   CL 101 11/22/2022 1016   CO2 23 11/22/2022 1016   GLUCOSE 94 11/22/2022 1016   GLUCOSE 110 (H) 08/08/2022 1805   BUN 15 11/22/2022 1016   CREATININE 0.92 11/22/2022 1016   CALCIUM 9.0 11/22/2022 1016   GFRNONAA 36 (L) 08/08/2022 1805   Lab Results  Component Value Date   HGBA1C 6.1 (H) 07/24/2023   HGBA1C  6.1 (H) 11/22/2022   Lab Results  Component Value Date   INSULIN  37.9 (H) 11/22/2022   Lab Results  Component Value Date   TSH 1.220 11/22/2022   CBC    Component Value Date/Time   WBC 13.9 (H) 11/22/2022 1016   WBC 9.1 08/08/2022 1805   RBC 4.78 11/22/2022 1016   RBC 4.47 08/08/2022 1805   HGB 12.8 11/22/2022 1016   HCT 41.0 11/22/2022 1016   PLT 183 08/08/2022 1805   MCV 86 11/22/2022 1016   MCH 26.8 11/22/2022 1016   MCH 27.5 08/08/2022 1805   MCHC 31.2 (L) 11/22/2022 1016   MCHC 32.1 08/08/2022 1805   RDW 13.8 11/22/2022 1016   Iron Studies No results found for: IRON, TIBC, FERRITIN, IRONPCTSAT Lipid Panel     Component Value Date/Time   CHOL 185 11/22/2022 1016   TRIG 74 11/22/2022 1016   HDL 54 11/22/2022 1016   LDLCALC 117 (H) 11/22/2022 1016   Hepatic  Function Panel     Component Value Date/Time   PROT 7.5 11/22/2022 1016   ALBUMIN 4.0 11/22/2022 1016   AST 16 11/22/2022 1016   ALT 14 11/22/2022 1016   ALKPHOS 86 11/22/2022 1016   BILITOT 0.2 11/22/2022 1016      Component Value Date/Time   TSH 1.220 11/22/2022 1016   Nutritional Lab Results  Component Value Date   VD25OH 27.8 (L) 07/24/2023   VD25OH 13.9 (L) 11/22/2022    Follow-Up   Return in about 3 weeks (around 11/14/2023) for For Weight Mangement with Dr. Francyne.SABRA She was informed of the importance of frequent follow up visits to maximize her success with intensive lifestyle modifications for her multiple health conditions.  Attestation Statement   Reviewed by clinician on day of visit: allergies, medications, problem list, medical history, surgical history, family history, social history, and previous encounter notes.   I have spent 40 minutes in the care of the patient today including: preparing to see patient (e.g. review and interpretation of tests, old notes ), obtaining and/or reviewing separately obtained history, performing a medically appropriate examination or evaluation, counseling and educating the patient, ordering medications, test or procedures, documenting clinical information in the electronic or other health care record, and independently interpreting results and communicating results to the patient, family, or caregiver   Lucas Francyne, MD

## 2023-10-24 NOTE — Assessment & Plan Note (Signed)
Untreated.  Intolerance to CPAP.  May affect weight and blood pressure control.  Counseled on risk associated with untreated sleep apnea.  Losing 15% of body weight may improve condition.  Continue GLP-1 therapy and medical weight loss.

## 2023-10-24 NOTE — Assessment & Plan Note (Signed)
Her vitamin D levels have recently doubled we will therefore discontinue high-dose vitamin D supplementation and transition to over-the-counter D3 2000 international units daily.

## 2023-10-24 NOTE — Assessment & Plan Note (Signed)
 Ashlee Gordon continues to make steady progress she lost 7 pounds and is having a good response to GLP-1 therapy without any adverse effects.  We will increase Wegovy  to 1.7 mg once a week for maintenance.  She has been increasing her protein intake and although physical activity levels have decreased we discussed other options due to weather.  See obesity treatment plan

## 2023-10-24 NOTE — Assessment & Plan Note (Signed)
 Most recent A1c is  Lab Results  Component Value Date   HGBA1C 6.1 (H) 07/24/2023  . Patient informed of disease state and risk of progression. This may contribute to abnormal cravings, fatigue and diabetes complications without having diabetes.   On Wegovy  for pharmacoprophylaxis.  Medication will be increased to 1.7 mg once a week

## 2023-10-24 NOTE — Assessment & Plan Note (Signed)
She had gastric sleeve in 2015 her preoperative weight was 536 with a nadir of 418 and weight regain.  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  Patient has initiated a weight loss trend now on GLP-1 therapy.

## 2023-10-24 NOTE — Assessment & Plan Note (Signed)
 Patient had some medication changes done by primary care team.  She did not tolerate combination losartan hydrochlorothiazide therefore medication was discontinued.  She is now on losartan 100 mg a day and amlodipine 10 mg daily she is taking her blood pressure medications in the morning obviously not lasting the entire day.  After discussion of benefits and side effect she will be started on Bystolic  2.5 mg in the evening for better 24-hour control.

## 2023-11-21 ENCOUNTER — Ambulatory Visit (INDEPENDENT_AMBULATORY_CARE_PROVIDER_SITE_OTHER): Payer: Medicaid Other | Admitting: Internal Medicine

## 2023-11-21 ENCOUNTER — Encounter (INDEPENDENT_AMBULATORY_CARE_PROVIDER_SITE_OTHER): Payer: Self-pay | Admitting: Internal Medicine

## 2023-11-21 ENCOUNTER — Other Ambulatory Visit (HOSPITAL_COMMUNITY): Payer: Self-pay

## 2023-11-21 DIAGNOSIS — E66813 Obesity, class 3: Secondary | ICD-10-CM | POA: Diagnosis not present

## 2023-11-21 DIAGNOSIS — R7303 Prediabetes: Secondary | ICD-10-CM

## 2023-11-21 DIAGNOSIS — Z903 Acquired absence of stomach [part of]: Secondary | ICD-10-CM

## 2023-11-21 DIAGNOSIS — I10 Essential (primary) hypertension: Secondary | ICD-10-CM | POA: Diagnosis not present

## 2023-11-21 DIAGNOSIS — G4733 Obstructive sleep apnea (adult) (pediatric): Secondary | ICD-10-CM

## 2023-11-21 DIAGNOSIS — Z6841 Body Mass Index (BMI) 40.0 and over, adult: Secondary | ICD-10-CM

## 2023-11-21 MED ORDER — SEMAGLUTIDE-WEIGHT MANAGEMENT 1.7 MG/0.75ML ~~LOC~~ SOAJ
1.7000 mg | SUBCUTANEOUS | 0 refills | Status: DC
  Filled 2023-11-21: qty 3, 28d supply, fill #0

## 2023-11-21 MED ORDER — NEBIVOLOL HCL 5 MG PO TABS
5.0000 mg | ORAL_TABLET | Freq: Every evening | ORAL | 0 refills | Status: DC
  Filled 2023-11-21: qty 30, 30d supply, fill #0

## 2023-11-21 NOTE — Assessment & Plan Note (Signed)
She had gastric sleeve in 2015 her preoperative weight was 536 with a nadir of 418 and weight regain.  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  Patient has initiated a weight loss trend now on GLP-1 therapy.

## 2023-11-21 NOTE — Assessment & Plan Note (Signed)
Uncontrolled suboptimal adherence with blood pressure medication.  She also recently received a shot of Toradol this is likely exacerbated her hypertension.  I will increase her Bystolic to 5 mg in the evening she will take amlodipine when she goes home.  We discussed using a pillbox.  I also recommend monitoring at home and following up with her primary care doctor for treatment intensification.  Counseled on the risk associated with uncontrolled blood pressure

## 2023-11-21 NOTE — Assessment & Plan Note (Signed)
Untreated.  Intolerance to CPAP.  May affect weight and blood pressure control.  Counseled on risk associated with untreated sleep apnea.  Losing 15% of body weight may improve condition.  Continue GLP-1 therapy and medical weight loss.

## 2023-11-21 NOTE — Progress Notes (Signed)
Office: [phone]  /  Fax: [phone]  Weight Summary And Biometrics  Vitals Temp: 98.3 F (36.8 C) BP: (!) 150/96 Pulse Rate: 75 SpO2: 100 %   Anthropometric Measurements Height: 5\' 8"  (1.727 m) Weight: (!) 404 lb (183.3 kg) BMI (Calculated): 61.44 Weight at Last Visit: 406 lb Weight Lost Since Last Visit: 2 lb Weight Gained Since Last Visit: 0 lb Starting Weight: 421 lb Total Weight Loss (lbs): 17 lb (7.711 kg) Peak Weight: 546 lb   Body Composition  Body Fat %: 61.4 % Fat Mass (lbs): 248.6 lbs Muscle Mass (lbs): 148.4 lbs Visceral Fat Rating : 28    No data recorded Today's Visit #: 13  Starting Date: 11/22/22   Subjective   Chief Complaint: Obesity  Ashlee Gordon is here to discuss her progress with her obesity treatment plan. She is on the the Category 4 Plan and states she is following her eating plan approximately 25 % of the time. She states she is not exercising.  Weight Progress Since Last Visit: Discussed the use of AI scribe software for clinical note transcription with the patient, who gave verbal consent to proceed.  History of Present Illness   The patient presents with high blood pressure and weight management concerns.  The patient has been experiencing fluctuating blood pressure readings, with recent measurements as high as 180. She recently purchased a new blood pressure cuff due to concerns about previous readings. Current medications include amlodipine 10 mg and losartan 50 mg, having previously been on losartan 100 mg with hydrochlorothiazide, which was discontinued due to adverse effects. She occasionally misses doses of amlodipine.  For weight management, she is on Wegovy 1.7 mg and reports no side effects such as constipation, nausea, or vomiting. She feels fuller quicker and has noticed appetite suppression. Despite not being able to exercise due to body pain, she has lost two pounds this month.  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 Recently, she received a Toradol injection for pain relief, which she prefers over steroids due to previous adverse effects.  She follows a reduced calorie plan inconsistently, about 25% of the time, due to increased pain levels. She skips breakfast, typically eating two meals a day, with the first meal around 11:30 AM.       Challenges affecting patient progress: low volume of physical activity at present  and difficulty implementing reduced calorie nutrition plan.   Orexigenic Control: Reports improved problems with appetite and hunger signals.  Reports improved problems with satiety and satiation.  Reports improved problems with eating patterns and portion control.  Denies abnormal cravings. Denies feeling deprived or restricted.   Pharmacotherapy for weight management: She is currently taking Wegovy with adequate clinical response  and without side effects..   Assessment and Plan   Treatment Plan For Obesity:  Recommended Dietary Goals  [name] is currently in the action stage of change. As such, her goal is to continue weight management plan. She has agreed to: continue to work on [employer] of reduced calorie nutrition plan (RCNP)  Behavioral Health and Counseling  We discussed the following behavioral modification strategies today: increasing lean protein intake to established goals, increasing vegetables, increasing fiber rich foods, avoiding skipping meals, increasing water intake , and work on meal planning and preparation.  Additional education and resources provided today: None  Recommended Physical Activity Goals  [name] has been advised to work  up to 150 minutes of moderate intensity aerobic activity a week and strengthening exercises 2-3 times per week for cardiovascular health, weight loss maintenance and preservation of muscle mass.    She has agreed to :  Think about enjoyable ways to increase daily physical activity and overcoming barriers to exercise and Increase physical activity in their day and reduce sedentary time (increase NEAT).  Pharmacotherapy  We discussed various medication options to help [name] with her weight loss efforts and we both agreed to : adequate clinical response to current dose, continue current regimen  Associated Conditions Impacted by Obesity Treatment  OSA (obstructive sleep apnea) Assessment & Plan: Untreated.  Intolerance to CPAP.  May affect weight and blood pressure control.  Counseled on risk associated with untreated sleep apnea.  Losing 15% of body weight may improve condition.  Continue GLP-1 therapy and medical weight loss.  Orders: -     Semaglutide-Weight Management; Inject 1.7 mg into the skin once a week for 28 days.  Dispense: 3 mL; Refill: 0  Class 3 severe obesity with serious comorbidity and body mass index (BMI) of 60.0 to 69.9 in adult, unspecified obesity type [facility]) Assessment & Plan: See obesity treatment plan  Orders: -     Semaglutide-Weight Management; Inject 1.7 mg into the skin once a week for 28 days.  Dispense: 3 mL; Refill: 0  H/O gastric sleeve Assessment & Plan: She had gastric sleeve in 2015 her preoperative weight was 536 with a nadir of 418 and weight regain.  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  Patient has initiated a weight loss trend now on GLP-1 therapy.  Orders: -     Semaglutide-Weight Management; Inject 1.7 mg into the skin once a week for 28 days.  Dispense: 3 mL; Refill: 0  Prediabetes Assessment & Plan: Most recent A1c is  Lab Results  Component Value Date   HGBA1C 6.1 (H) 07/24/2023  . Patient informed of disease state and risk of progression. This may contribute to abnormal cravings, fatigue and diabetes complications without having diabetes.   On Wegovy for pharmacoprophylaxis.  No side  effects reported continue current regimen    Orders: -     Semaglutide-Weight Management; Inject 1.7 mg into the skin once a week for 28 days.  Dispense: 3 mL; Refill: 0  Primary hypertension Assessment & Plan: Uncontrolled suboptimal adherence with blood pressure medication.  She also recently received a shot of Toradol this is likely exacerbated her hypertension.  I will increase her Bystolic to 5 mg in the evening she will take amlodipine when she goes home.  We discussed using a pillbox.  I also recommend monitoring at home and following up with her primary care doctor for treatment intensification.  Counseled on the risk associated with uncontrolled blood pressure  Orders: -     Nebivolol HCl; Take 1 tablet (5 mg total) by mouth at bedtime.  Dispense: 30 tablet; Refill: 0   Objective   Physical Exam:  Blood pressure (!) 150/96, pulse 75, temperature 98.3 F (36.8 C), height 5\' 8"  (1.727 m), weight (!) 404 lb (183.3 kg), last menstrual period 10/22/2020, SpO2 100%. Body mass index is 61.43 kg/m.  General: She is overweight, cooperative, alert, well developed, and in no acute distress. PSYCH: Has normal mood, affect and thought process.   HEENT: EOMI, sclerae are anicteric. Lungs: Normal breathing effort, no conversational dyspnea. Extremities: No edema.  Neurologic: No gross sensory or motor deficits. No tremors or fasciculations noted.  Diagnostic Data Reviewed:  BMET    Component Value Date/Time   NA 141 11/22/2022 1016   K 4.1 11/22/2022 1016   CL 101 11/22/2022 1016   CO2 23 11/22/2022 1016   GLUCOSE 94 11/22/2022 1016   GLUCOSE 110 (H) 08/08/2022 1805   BUN 15 11/22/2022 1016   CREATININE 0.92 11/22/2022 1016   CALCIUM 9.0 11/22/2022 1016   GFRNONAA 36 (L) 08/08/2022 1805   Lab Results  Component Value Date   HGBA1C 6.1 (H) 07/24/2023   HGBA1C 6.1 (H) 11/22/2022   Lab Results  Component Value Date   INSULIN 37.9 (H) 11/22/2022   Lab Results   Component Value Date   TSH 1.220 11/22/2022   CBC    Component Value Date/Time   WBC 13.9 (H) 11/22/2022 1016   WBC 9.1 08/08/2022 1805   RBC 4.78 11/22/2022 1016   RBC 4.47 08/08/2022 1805   HGB 12.8 11/22/2022 1016   HCT 41.0 11/22/2022 1016   PLT 183 08/08/2022 1805   MCV 86 11/22/2022 1016   MCH 26.8 11/22/2022 1016   MCH 27.5 08/08/2022 1805   MCHC 31.2 (L) 11/22/2022 1016   MCHC 32.1 08/08/2022 1805   RDW 13.8 11/22/2022 1016   Iron Studies No results found for: "IRON", "TIBC", "FERRITIN", "IRONPCTSAT" Lipid Panel     Component Value Date/Time   CHOL 185 11/22/2022 1016   TRIG 74 11/22/2022 1016   HDL 54 11/22/2022 1016   LDLCALC 117 (H) 11/22/2022 1016   Hepatic Function Panel     Component Value Date/Time   PROT 7.5 11/22/2022 1016   ALBUMIN 4.0 11/22/2022 1016   AST 16 11/22/2022 1016   ALT 14 11/22/2022 1016   ALKPHOS 86 11/22/2022 1016   BILITOT 0.2 11/22/2022 1016      Component Value Date/Time   TSH 1.220 11/22/2022 1016   Nutritional Lab Results  Component Value Date   VD25OH 27.8 (L) 07/24/2023   VD25OH 13.9 (L) 11/22/2022    Follow-Up   Return in about 4 weeks (around 12/19/2023) for For Weight Mangement with Dr. Rikki Spearing.Marland Kitchen She was informed of the importance of frequent follow up visits to maximize her success with intensive lifestyle modifications for her multiple health conditions.  Attestation Statement   Reviewed by clinician on day of visit: allergies, medications, problem list, medical history, surgical history, family history, social history, and previous encounter notes.     Worthy Rancher, MD

## 2023-11-21 NOTE — Assessment & Plan Note (Signed)
Most recent A1c is  Lab Results  Component Value Date   HGBA1C 6.1 (H) 07/24/2023  . Patient informed of disease state and risk of progression. This may contribute to abnormal cravings, fatigue and diabetes complications without having diabetes.   On Wegovy for pharmacoprophylaxis.  No side effects reported continue current regimen

## 2023-11-21 NOTE — Assessment & Plan Note (Signed)
See obesity treatment plan

## 2023-12-19 ENCOUNTER — Ambulatory Visit (INDEPENDENT_AMBULATORY_CARE_PROVIDER_SITE_OTHER): Payer: Medicaid Other | Admitting: Internal Medicine

## 2023-12-20 ENCOUNTER — Other Ambulatory Visit (HOSPITAL_COMMUNITY): Payer: Self-pay

## 2023-12-20 ENCOUNTER — Other Ambulatory Visit (INDEPENDENT_AMBULATORY_CARE_PROVIDER_SITE_OTHER): Payer: Self-pay | Admitting: Internal Medicine

## 2023-12-20 DIAGNOSIS — I10 Essential (primary) hypertension: Secondary | ICD-10-CM

## 2023-12-20 MED ORDER — NEBIVOLOL HCL 5 MG PO TABS
5.0000 mg | ORAL_TABLET | Freq: Every evening | ORAL | 0 refills | Status: DC
  Filled 2023-12-20: qty 90, 90d supply, fill #0

## 2023-12-25 ENCOUNTER — Telehealth (INDEPENDENT_AMBULATORY_CARE_PROVIDER_SITE_OTHER): Payer: Self-pay | Admitting: Internal Medicine

## 2023-12-25 ENCOUNTER — Encounter (INDEPENDENT_AMBULATORY_CARE_PROVIDER_SITE_OTHER): Payer: Self-pay

## 2023-12-25 NOTE — Telephone Encounter (Signed)
 Patient called stating that she needs a refill of  Semaglutide-Weight Management 1.7 MG/0.75ML SOAJ. Pt could not get an appt with Dr. Rikki Spearing that would fit her work schedule. Pt would like the medication refilled without the office visit.

## 2023-12-27 DIAGNOSIS — M7670 Peroneal tendinitis, unspecified leg: Secondary | ICD-10-CM | POA: Insufficient documentation

## 2023-12-29 DIAGNOSIS — M545 Low back pain, unspecified: Secondary | ICD-10-CM | POA: Insufficient documentation

## 2024-01-02 ENCOUNTER — Other Ambulatory Visit (HOSPITAL_COMMUNITY): Payer: Self-pay

## 2024-01-16 ENCOUNTER — Encounter (INDEPENDENT_AMBULATORY_CARE_PROVIDER_SITE_OTHER): Payer: Self-pay | Admitting: Internal Medicine

## 2024-01-16 ENCOUNTER — Ambulatory Visit (INDEPENDENT_AMBULATORY_CARE_PROVIDER_SITE_OTHER): Payer: Medicaid Other | Admitting: Internal Medicine

## 2024-01-16 VITALS — BP 128/81 | HR 82 | Temp 98.4°F | Ht 68.0 in | Wt >= 6400 oz

## 2024-01-16 DIAGNOSIS — I10 Essential (primary) hypertension: Secondary | ICD-10-CM

## 2024-01-16 DIAGNOSIS — E66813 Obesity, class 3: Secondary | ICD-10-CM | POA: Diagnosis not present

## 2024-01-16 DIAGNOSIS — R7303 Prediabetes: Secondary | ICD-10-CM | POA: Diagnosis not present

## 2024-01-16 DIAGNOSIS — Z6841 Body Mass Index (BMI) 40.0 and over, adult: Secondary | ICD-10-CM

## 2024-01-16 DIAGNOSIS — Z903 Acquired absence of stomach [part of]: Secondary | ICD-10-CM

## 2024-01-16 NOTE — Progress Notes (Signed)
 Office: [phone]  /  Fax: [phone]  Weight Summary And Biometrics  Vitals Temp: 98.4 F (36.9 C) BP: 128/81 Pulse Rate: 82 SpO2: 100 %   Anthropometric Measurements Height: 5\' 8"  (1.727 m) Weight: (!) 404 lb (183.3 kg) BMI (Calculated): 61.44 Weight at Last Visit: 404 lb Weight Lost Since Last Visit: 0 Weight Gained Since Last Visit: 0 Starting Weight: 421 lb Total Weight Loss (lbs): 17 lb (7.711 kg) Peak Weight: 546 lb   Body Composition  Body Fat %: 61.4 % Fat Mass (lbs): 248 lbs Muscle Mass (lbs): 148.2 lbs Visceral Fat Rating : 28    RMR: 3096  Today's Visit #: 14  Starting Date: 11/22/22   Subjective   Chief Complaint: Obesity  Interval History Discussed the use of AI scribe software for clinical note transcription with the patient, who gave verbal consent to proceed.  History of Present Illness Ashlee Gordon is a 53 year old female with obesity, gastric sleeve surgery, prediabetes, hypertension, and sleep apnea who presents for medical weight management.  She has a history of obesity and underwent gastric sleeve surgery. Her peak weight was 546 pounds, and she is currently at 404 pounds, having lost 17 pounds since starting the program at 421 pounds. She has low adherence to a reduced calorie nutrition plan and is not tracking her intake. She maintains adequate hydration but skips meals. She was on Wegovy 1.7 mg once a week but has been off it for about a month and a half due to scheduling issues and insurance coverage. She notes no significant difference in appetite with or without the medication, as her main issue is not eating enough.  Her hypertension is managed with amlodipine and losartan. She experiences leg cramps, particularly in her left leg, which she associates with her blood pressure medication Bystolic. She has been experimenting with taking the medication every other night to manage the cramps.  She has a history of prediabetes and  was previously on Wegovy. She is not currently on any specific medication for prediabetes.      Challenges affecting patient progress: having difficulty with meal prep and planning, difficulty implementing reduced calorie nutrition plan, low volume of physical activity at present , orthopedic problems, medical conditions or chronic pain affecting mobility, and metabolic adaptations associated with gastric bypass .    Pharmacotherapy for weight management: She is currently taking Wegovy the patient has decided to discontinue use of medication due to lack of clinical benefit .   Assessment and Plan   Treatment Plan For Obesity:  Recommended Dietary Goals  Ashlee Gordon is currently in the action stage of change. As such, her goal is to continue weight management plan. She has agreed to: keep a food journal with a target of  1800 calories per day and 120 - 150 grams of protein per day  or 40 - 50 grams per meal. and follow a tailored, multi-day, low carbohydrate, high protein plan targeting 1800 calories and 120 grams of protein per day  Behavioral Health and Counseling  We discussed the following behavioral modification strategies today: increasing lean protein intake to established goals, continue to work on implementation of reduced calorie nutritional plan, and continue to work on maintaining a reduced calorie state, getting the recommended amount of protein, incorporating whole foods, making healthy choices, staying well hydrated and practicing mindfulness when eating..  Additional education and resources provided today: New 7-day meal plan targeting 1800 cal was generated for her  Recommended Physical Activity Goals  Ashlee Gordon has been advised to work up to 150 minutes of moderate intensity aerobic activity a week and strengthening exercises 2-3 times per week for cardiovascular health, weight loss maintenance and preservation of muscle mass.   She has agreed to :  Think about enjoyable ways  to increase daily physical activity and overcoming barriers to exercise and Increase physical activity in their day and reduce sedentary time (increase NEAT).  Pharmacotherapy  We discussed various medication options to help Ashlee Gordon with her weight loss efforts and we both agreed to : continue with nutritional and behavioral strategies and discontinue GLP-1 therapy due to lack of therapeutic benefit  Associated Conditions Impacted by Obesity Treatment  Primary hypertension  Class 3 severe obesity with serious comorbidity and body mass index (BMI) of 60.0 to 69.9 in adult, unspecified obesity type (HCC)  Prediabetes  H/O gastric sleeve    Assessment and Plan Assessment & Plan Obesity She underwent gastric sleeve surgery and has reduced her weight from a peak of 546 pounds to 404 pounds, losing 17 pounds since starting the program. She discontinued Wegovy due to insurance and scheduling issues, and it did not significantly impact her appetite. Her primary issue is insufficient food intake due to lack of appetite. The focus is on increasing meal frequency and protein intake to enhance metabolic rate and support weight loss. She is encouraged to consume 1800 calories daily to achieve a weight loss of at least one pound per week. - Adhere to an 1800 calorie diet with high protein intake (120 grams per day). - Increase meal frequency to three meals per day. - Resume physical activity, including aqua exercises and dance, four days a week. - Follow a high-protein meal plan to guide dietary choices.  Prediabetes She has prediabetes and prefers to minimize medication use, currently not on metformin. The focus is on lifestyle modifications to manage her weight and blood glucose levels. - Implement dietary modifications and physical activity to manage prediabetes. - Consider metformin if lifestyle changes are insufficient.  Hypertension She is on amlodipine and losartan for hypertension. She  experiences leg cramps, particularly in the left leg, associated with Bystolic (nebivolol) use. She takes Bystolic every other night to manage cramps and is concerned about potential blood pressure increases if discontinued. Discontinuation of Bystolic is considered due to cramps, with a plan to monitor blood pressure closely. - Discontinue Bystolic due to leg cramps. - Monitor blood pressure closely after discontinuation of Bystolic. - Consider starting carvedilol at 3.25 mg twice daily if blood pressure increases.  General Health Maintenance She is advised to maintain a balanced diet, adequate hydration, and regular physical activity, focusing on increased protein intake and meal frequency to support overall health and weight management goals. - Consume fruits and vegetables and adequate water intake. - Use multivitamins to support nutritional needs.      Objective   Physical Exam:  Blood pressure 128/81, pulse 82, temperature 98.4 F (36.9 C), height 5\' 8"  (1.727 m), weight (!) 404 lb (183.3 kg), last menstrual period 10/22/2020, SpO2 100%. Body mass index is 61.43 kg/m.  General: She is overweight, cooperative, alert, well developed, and in no acute distress. PSYCH: Has normal mood, affect and thought process.   HEENT: EOMI, sclerae are anicteric. Lungs: Normal breathing effort, no conversational dyspnea. Extremities: No edema.  Neurologic: No gross sensory or motor deficits. No tremors or fasciculations noted.    Diagnostic Data Reviewed:  BMET    Component Value Date/Time   NA 141 11/22/2022 1016  K 4.1 11/22/2022 1016   CL 101 11/22/2022 1016   CO2 23 11/22/2022 1016   GLUCOSE 94 11/22/2022 1016   GLUCOSE 110 (H) 08/08/2022 1805   BUN 15 11/22/2022 1016   CREATININE 0.92 11/22/2022 1016   CALCIUM 9.0 11/22/2022 1016   GFRNONAA 36 (L) 08/08/2022 1805   Lab Results  Component Value Date   HGBA1C 6.1 (H) 07/24/2023   HGBA1C 6.1 (H) 11/22/2022   Lab Results   Component Value Date   INSULIN 37.9 (H) 11/22/2022   Lab Results  Component Value Date   TSH 1.220 11/22/2022   CBC    Component Value Date/Time   WBC 13.9 (H) 11/22/2022 1016   WBC 9.1 08/08/2022 1805   RBC 4.78 11/22/2022 1016   RBC 4.47 08/08/2022 1805   HGB 12.8 11/22/2022 1016   HCT 41.0 11/22/2022 1016   PLT 183 08/08/2022 1805   MCV 86 11/22/2022 1016   MCH 26.8 11/22/2022 1016   MCH 27.5 08/08/2022 1805   MCHC 31.2 (L) 11/22/2022 1016   MCHC 32.1 08/08/2022 1805   RDW 13.8 11/22/2022 1016   Iron Studies No results found for: "IRON", "TIBC", "FERRITIN", "IRONPCTSAT" Lipid Panel     Component Value Date/Time   CHOL 185 11/22/2022 1016   TRIG 74 11/22/2022 1016   HDL 54 11/22/2022 1016   LDLCALC 117 (H) 11/22/2022 1016   Hepatic Function Panel     Component Value Date/Time   PROT 7.5 11/22/2022 1016   ALBUMIN 4.0 11/22/2022 1016   AST 16 11/22/2022 1016   ALT 14 11/22/2022 1016   ALKPHOS 86 11/22/2022 1016   BILITOT 0.2 11/22/2022 1016      Component Value Date/Time   TSH 1.220 11/22/2022 1016   Nutritional Lab Results  Component Value Date   VD25OH 27.8 (L) 07/24/2023   VD25OH 13.9 (L) 11/22/2022    Medications: Outpatient Encounter Medications as of 01/16/2024  Medication Sig Note   amLODipine (NORVASC) 10 MG tablet Take 10 mg by mouth daily.    CAPSAICIN HEAT PATCH EX Apply topically.    Cholecalciferol (VITAMIN D3) 50 MCG (2000 UT) capsule Take 1 capsule (2,000 Units total) by mouth daily.    Cyanocobalamin (B-12 COMPLIANCE INJECTION) 1000 MCG/ML KIT Inject as directed.    ergocalciferol (VITAMIN D2) 1.25 MG (50000 UT) capsule Take 1 capsule (50,000 Units total) by mouth once a week.    losartan (COZAAR) 100 MG tablet Take 100 mg by mouth daily.    Multiple Vitamin (MULTIVITAMIN ADULT PO) Take by mouth.    traMADol HCl 100 MG TABS Take by mouth.    [DISCONTINUED] nebivolol (BYSTOLIC) 5 MG tablet Take 1 tablet (5 mg total) by mouth at  bedtime. 01/16/2024: Leg cramps   [DISCONTINUED] Semaglutide-Weight Management 1.7 MG/0.75ML SOAJ Inject 1.7 mg into the skin once a week for 28 days. 01/16/2024: Patient preference, lack of clinical benefit   predniSONE (DELTASONE) 10 MG tablet Take by mouth. (Patient not taking: Reported on 01/16/2024)    No facility-administered encounter medications on file as of 01/16/2024.     Follow-Up   Return in about 4 weeks (around 02/13/2024) for For Weight Mangement with Dr. Rikki Spearing.Marland Kitchen She was informed of the importance of frequent follow up visits to maximize her success with intensive lifestyle modifications for her multiple health conditions.  Attestation Statement   Reviewed by clinician on day of visit: allergies, medications, problem list, medical history, surgical history, family history, social history, and previous encounter notes.  Worthy Rancher, MD

## 2024-02-05 ENCOUNTER — Ambulatory Visit (INDEPENDENT_AMBULATORY_CARE_PROVIDER_SITE_OTHER): Payer: Medicaid Other | Admitting: Internal Medicine

## 2024-02-05 ENCOUNTER — Encounter (INDEPENDENT_AMBULATORY_CARE_PROVIDER_SITE_OTHER): Payer: Self-pay | Admitting: Internal Medicine

## 2024-02-05 VITALS — BP 140/87 | HR 89 | Temp 97.7°F | Ht 68.0 in | Wt >= 6400 oz

## 2024-02-05 DIAGNOSIS — E66813 Obesity, class 3: Secondary | ICD-10-CM | POA: Diagnosis not present

## 2024-02-05 DIAGNOSIS — R7303 Prediabetes: Secondary | ICD-10-CM | POA: Diagnosis not present

## 2024-02-05 DIAGNOSIS — M545 Low back pain, unspecified: Secondary | ICD-10-CM | POA: Insufficient documentation

## 2024-02-05 DIAGNOSIS — G4733 Obstructive sleep apnea (adult) (pediatric): Secondary | ICD-10-CM | POA: Diagnosis not present

## 2024-02-05 DIAGNOSIS — Z6841 Body Mass Index (BMI) 40.0 and over, adult: Secondary | ICD-10-CM

## 2024-02-05 DIAGNOSIS — I10 Essential (primary) hypertension: Secondary | ICD-10-CM | POA: Diagnosis not present

## 2024-02-05 DIAGNOSIS — Z903 Acquired absence of stomach [part of]: Secondary | ICD-10-CM

## 2024-02-05 NOTE — Assessment & Plan Note (Signed)
 Untreated.  Intolerance to CPAP.  May affect weight and blood pressure control.  Counseled on risk associated with untreated sleep apnea.  Losing 15% of body weight may improve condition.

## 2024-02-05 NOTE — Assessment & Plan Note (Signed)
 She had gastric sleeve in 2015 her preoperative weight was 536 with a nadir of 418 and weight regain.  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  She has adequate appetite control likely due to adiposity and restrictive nature of surgery.  Does not benefit at present from antiobesity medications.

## 2024-02-05 NOTE — Progress Notes (Signed)
 Office: [phone]  /  Fax: [phone]  Weight Summary And Biometrics  Vitals Temp: 97.7 F (36.5 C) BP: (!) 140/87 Pulse Rate: 89 SpO2: 97 %   Anthropometric Measurements Height: 5\' 8"  (1.727 m) Weight: (!) 409 lb (185.5 kg) BMI (Calculated): 62.2 Weight at Last Visit: 404 lb Weight Lost Since Last Visit: 5 lb Weight Gained Since Last Visit: 0 lb Starting Weight: 421 lb Total Weight Loss (lbs): 22 lb (9.979 kg) Peak Weight: 546 lb   Body Composition  Body Fat %: 59.9 % Fat Mass (lbs): 245.2 lbs Muscle Mass (lbs): 156 lbs Visceral Fat Rating : 27    RMR: 3096  Today's Visit #: 15  Starting Date: 11/22/22   Subjective   Chief Complaint: Obesity  Interval History Discussed the use of AI scribe software for clinical note transcription with the patient, who gave verbal consent to proceed.  Discussed the use of AI scribe software for clinical note transcription with the patient, who gave verbal consent to proceed.  History of Present Illness   Ashlee Gordon is a 53 year old female with a history of gastric sleeve, prediabetes, and hypertension who presents for medical weight management.  She has lost a total of 22 pounds since starting the program about a year ago, with a recent loss of five pounds after discontinuing Wegovy. She follows an 1800 calorie meal plan without exercising. Her diet includes proteins such as chicken, tuna, shrimp, fish, [place], and occasionally beef, along with fruits, vegetables, and some carbohydrates like jasmine rice. She is not a big bread eater and is considering incorporating more beans into her diet. She has no appetite, which she attributes to her gastric sleeve surgery and a pre-existing condition before the surgery. She mentions having a low appetite even before the surgery.  She has not been exercising due to back and leg issues, for which she receives steroid injections. These injections provide temporary relief, lasting  about two days.  She has a history of taking metformin for prediabetes but discontinued it due to nausea, described as feeling like she needs to vomit. She had no issues with Wegovy in terms of side effects.  Her blood pressure was noted to be elevated at 140/87, which she attributes to pain. She stopped taking Bystolic due to leg cramps, which have since resolved.        Challenges affecting patient progress: having difficulty with meal prep and planning, low volume of physical activity at present , and slow metabolism for age.    Pharmacotherapy for weight management: She is currently taking no anti-obesity medication and had been on Wegovy but medication has been discontinued as of 2 months ago due to lack of clinical response .   Assessment and Plan   Treatment Plan For Obesity:  Recommended Dietary Goals  Korbyn is currently in the action stage of change. As such, her goal is to continue weight management plan. She has agreed to: follow the Category 4 plan - 1800 kcal per day  Behavioral Health and Counseling  We discussed the following behavioral modification strategies today: increasing lean protein intake to established goals, avoiding skipping meals, and work on increasing physical activity .  Additional education and resources provided today: None  Recommended Physical Activity Goals  [name] has been advised to work up to 150 minutes of moderate intensity aerobic activity a week and strengthening exercises 2-3 times per week for cardiovascular health, weight loss maintenance and preservation of muscle mass.   She has  agreed to :  Think about enjoyable ways to increase daily physical activity and overcoming barriers to exercise, Increase physical activity in their day and reduce sedentary time (increase NEAT)., Increase the intensity, frequency or duration of strengthening exercises , and Increase the intensity, frequency or duration of aerobic exercises     Pharmacotherapy  We discussed various medication options to help [name] with her weight loss efforts and we both agreed to : continue with nutritional and behavioral strategies and did not respond to GLP-1 has contraindications to sympathomimetics because of elevated blood pressure  Associated Conditions Impacted by Obesity Treatment  Primary hypertension Assessment & Plan: Her blood pressure is elevated she thinks is related to pain.  She has discontinued Bystolic due to leg cramps but nothing else has been added.  I recommend that she monitor her blood pressure twice a day for the next 7 days for goal blood pressure of less than 130/80.  If blood pressure is not well-controlled she will be started on carvedilol either by her PCP or [place] at the next visit.   OSA (obstructive sleep apnea) Assessment & Plan: Untreated.  Intolerance to CPAP.  May affect weight and blood pressure control.  Counseled on risk associated with untreated sleep apnea.  Losing 15% of body weight may improve condition.     Class 3 severe obesity with serious comorbidity and body mass index (BMI) of 60.0 to 69.9 in adult, unspecified obesity type [facility]) Assessment & Plan: She has lost 22 pounds and has been able to keep the weight off over the last year.  She has a slow metabolic rate and history of gastric bypass.  She did not respond to GLP-1. -She is to avoid skipping meals -We discussed nutritional and behavioral strategies to boost metabolic rate -Refer to obesity treatment plan -No pharmacotherapy recommended at present time.  She has contraindications to stimulants due to hypertension   H/O gastric sleeve Assessment & Plan: She had gastric sleeve in 2015 her preoperative weight was 536 with a nadir of 418 and weight regain.  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  She has adequate appetite control likely due to adiposity and restrictive nature of surgery.  Does not benefit  at present from antiobesity medications.   Prediabetes Assessment & Plan: She is scheduled to have disease monitoring labs at her PCPs office and will bring these in at the next office visit.  She is no longer on GLP-1 for pharmacoprophylaxis and she had side effects to metformin.  She will continue with current weight management strategy for diabetes prevention.  We also discussed the benefits of getting physical activity and shooting for 150 minutes a week              Objective   Physical Exam:  Blood pressure (!) 140/87, pulse 89, temperature 97.7 F (36.5 C), height 5\' 8"  (1.727 m), weight (!) 409 lb (185.5 kg), last menstrual period 10/22/2020, SpO2 97%. Body mass index is 62.19 kg/m.  General: She is overweight, cooperative, alert, well developed, and in no acute distress. PSYCH: Has normal mood, affect and thought process.   HEENT: EOMI, sclerae are anicteric. Lungs: Normal breathing effort, no conversational dyspnea. Extremities: No edema.  Neurologic: No gross sensory or motor deficits. No tremors or fasciculations noted.    Diagnostic Data Reviewed:  BMET    Component Value Date/Time   NA 141 11/22/2022 1016   K 4.1 11/22/2022 1016   CL 101 11/22/2022 1016   CO2  23 11/22/2022 1016   GLUCOSE 94 11/22/2022 1016   GLUCOSE 110 (H) 08/08/2022 1805   BUN 15 11/22/2022 1016   CREATININE 0.92 11/22/2022 1016   CALCIUM 9.0 11/22/2022 1016   GFRNONAA 36 (L) 08/08/2022 1805   Lab Results  Component Value Date   HGBA1C 6.1 (H) 07/24/2023   HGBA1C 6.1 (H) 11/22/2022   Lab Results  Component Value Date   INSULIN 37.9 (H) 11/22/2022   Lab Results  Component Value Date   TSH 1.220 11/22/2022   CBC    Component Value Date/Time   WBC 13.9 (H) 11/22/2022 1016   WBC 9.1 08/08/2022 1805   RBC 4.78 11/22/2022 1016   RBC 4.47 08/08/2022 1805   HGB 12.8 11/22/2022 1016   HCT 41.0 11/22/2022 1016   PLT 183 08/08/2022 1805   MCV 86 11/22/2022 1016   MCH 26.8  11/22/2022 1016   MCH 27.5 08/08/2022 1805   MCHC 31.2 (L) 11/22/2022 1016   MCHC 32.1 08/08/2022 1805   RDW 13.8 11/22/2022 1016   Iron Studies No results found for: "IRON", "TIBC", "FERRITIN", "IRONPCTSAT" Lipid Panel     Component Value Date/Time   CHOL 185 11/22/2022 1016   TRIG 74 11/22/2022 1016   HDL 54 11/22/2022 1016   LDLCALC 117 (H) 11/22/2022 1016   Hepatic Function Panel     Component Value Date/Time   PROT 7.5 11/22/2022 1016   ALBUMIN 4.0 11/22/2022 1016   AST 16 11/22/2022 1016   ALT 14 11/22/2022 1016   ALKPHOS 86 11/22/2022 1016   BILITOT 0.2 11/22/2022 1016      Component Value Date/Time   TSH 1.220 11/22/2022 1016   Nutritional Lab Results  Component Value Date   VD25OH 27.8 (L) 07/24/2023   VD25OH 13.9 (L) 11/22/2022    Medications: Outpatient Encounter Medications as of 02/05/2024  Medication Sig   amLODipine (NORVASC) 10 MG tablet Take 10 mg by mouth daily.   CAPSAICIN HEAT PATCH EX Apply topically.   Cholecalciferol (VITAMIN D3) 50 MCG (2000 UT) capsule Take 1 capsule (2,000 Units total) by mouth daily.   Cyanocobalamin (B-12 COMPLIANCE INJECTION) 1000 MCG/ML KIT Inject as directed.   ergocalciferol (VITAMIN D2) 1.25 MG (50000 UT) capsule Take 1 capsule (50,000 Units total) by mouth once a week.   losartan (COZAAR) 100 MG tablet Take 100 mg by mouth daily.   Multiple Vitamin (MULTIVITAMIN ADULT PO) Take by mouth.   predniSONE (DELTASONE) 10 MG tablet Take by mouth.   traMADol HCl 100 MG TABS Take by mouth.   No facility-administered encounter medications on file as of 02/05/2024.     Follow-Up   Return in about 6 weeks (around 03/18/2024) for For Weight Mangement with Dr. Allie Area.Aaron Aas She was informed of the importance of frequent follow up visits to maximize her success with intensive lifestyle modifications for her multiple health conditions.  Attestation Statement   Reviewed by clinician on day of visit: allergies, medications, problem  list, medical history, surgical history, family history, social history, and previous encounter notes.     Ladd Picker, MD

## 2024-02-05 NOTE — Assessment & Plan Note (Signed)
 Her blood pressure is elevated she thinks is related to pain.  She has discontinued Bystolic due to leg cramps but nothing else has been added.  I recommend that she monitor her blood pressure twice a day for the next 7 days for goal blood pressure of less than 130/80.  If blood pressure is not well-controlled she will be started on carvedilol either by her PCP or us  at the next visit.

## 2024-02-05 NOTE — Assessment & Plan Note (Signed)
 She is scheduled to have disease monitoring labs at her PCPs office and will bring these in at the next office visit.  She is no longer on GLP-1 for pharmacoprophylaxis and she had side effects to metformin.  She will continue with current weight management strategy for diabetes prevention.  We also discussed the benefits of getting physical activity and shooting for 150 minutes a week

## 2024-02-05 NOTE — Assessment & Plan Note (Signed)
 She has lost 22 pounds and has been able to keep the weight off over the last year.  She has a slow metabolic rate and history of gastric bypass.  She did not respond to GLP-1. -She is to avoid skipping meals -We discussed nutritional and behavioral strategies to boost metabolic rate -Refer to obesity treatment plan -No pharmacotherapy recommended at present time.  She has contraindications to stimulants due to hypertension

## 2024-02-15 ENCOUNTER — Ambulatory Visit (INDEPENDENT_AMBULATORY_CARE_PROVIDER_SITE_OTHER): Admitting: Internal Medicine

## 2024-02-26 ENCOUNTER — Emergency Department (HOSPITAL_BASED_OUTPATIENT_CLINIC_OR_DEPARTMENT_OTHER)

## 2024-02-26 ENCOUNTER — Encounter (HOSPITAL_BASED_OUTPATIENT_CLINIC_OR_DEPARTMENT_OTHER): Payer: Self-pay | Admitting: Emergency Medicine

## 2024-02-26 ENCOUNTER — Other Ambulatory Visit: Payer: Self-pay

## 2024-02-26 ENCOUNTER — Emergency Department (HOSPITAL_BASED_OUTPATIENT_CLINIC_OR_DEPARTMENT_OTHER)
Admission: EM | Admit: 2024-02-26 | Discharge: 2024-02-26 | Disposition: A | Discharge status: Home / Self Care | Attending: Emergency Medicine | Admitting: Emergency Medicine

## 2024-02-26 DIAGNOSIS — M5441 Lumbago with sciatica, right side: Secondary | ICD-10-CM | POA: Diagnosis not present

## 2024-02-26 DIAGNOSIS — R600 Localized edema: Secondary | ICD-10-CM

## 2024-02-26 DIAGNOSIS — M79661 Pain in right lower leg: Secondary | ICD-10-CM | POA: Diagnosis not present

## 2024-02-26 DIAGNOSIS — M545 Low back pain, unspecified: Secondary | ICD-10-CM | POA: Diagnosis present

## 2024-02-26 MED ORDER — OXYCODONE-ACETAMINOPHEN 5-325 MG PO TABS
1.0000 | ORAL_TABLET | Freq: Once | ORAL | Status: AC
  Administered 2024-02-26: 1 via ORAL
  Filled 2024-02-26: qty 1

## 2024-02-26 MED ORDER — CYCLOBENZAPRINE HCL 10 MG PO TABS: 10.0000 mg | ORAL_TABLET | Freq: Two times a day (BID) | ORAL | 0 refills | Status: DC | PRN

## 2024-02-26 MED ORDER — PREDNISONE 50 MG PO TABS
60.0000 mg | ORAL_TABLET | Freq: Once | ORAL | Status: DC
  Filled 2024-02-26: qty 1

## 2024-02-26 NOTE — ED Provider Notes (Signed)
 [place] EMERGENCY DEPARTMENT AT [facility] Provider Note   CSN: [id number] Arrival date & time: 02/26/24  1929     History  Chief Complaint  Patient presents with   Back Pain    Ashlee Gordon is a 53 y.o. female, history of fibromyalgia, bypass, back pain, who presents to the ED secondary to right-sided back pain, and right-sided leg pain, this been going on for the last day.  She states she has a history of sciatica, and has pain, that radiates from her right buttocks, to her foot.  She states this been going on for about a day, and it feels very similar to her past sciatica.  Has not had any recent falls, no IV drug use, no immunosuppressive agents.  That she has not had any kind of rashes, or wounds.  Additionally states that she has some severe pain in her right knee, and leg.  She states that her right leg, lower leg feels very swollen, and it feels like there is a knot underneath the back of her knee.  She denies any recent injuries, surgeries, or history of DVTs.  She is not have any shortness of breath, chest pain, or rash.  States the pain is throbbing     Home Medications Prior to Admission medications   Medication Sig Start Date End Date Taking? Authorizing Provider  cyclobenzaprine  (FLEXERIL ) 10 MG tablet Take 1 tablet (10 mg total) by mouth 2 (two) times daily as needed for muscle spasms. 02/26/24  Yes Julia Alkhatib L, PA  amLODipine (NORVASC) 10 MG tablet Take 10 mg by mouth daily.    [provider]  CAPSAICIN HEAT PATCH EX Apply topically.    [provider]  Cholecalciferol (VITAMIN D3) 50 MCG (2000 UT) capsule Take 1 capsule (2,000 Units total) by mouth daily. 10/24/23   Ladd Picker, MD  Cyanocobalamin (B-12 COMPLIANCE INJECTION) 1000 MCG/ML KIT Inject as directed.    [provider]  ergocalciferol  (VITAMIN D2) 1.25 MG (50000 UT) capsule Take 1 capsule (50,000 Units total) by mouth once a week. 07/24/23   Ladd Picker,  MD  losartan (COZAAR) 100 MG tablet Take 100 mg by mouth daily.    [provider]  Multiple Vitamin (MULTIVITAMIN ADULT PO) Take by mouth.    [provider]  predniSONE  (DELTASONE ) 10 MG tablet Take by mouth. 12/29/23   [provider]  traMADol HCl 100 MG TABS Take by mouth. 04/14/22   [provider]      Allergies    Metformin  and related    Review of Systems   Review of Systems  Musculoskeletal:  Positive for back pain.    Physical Exam Updated Vital Signs BP (!) 143/91   Pulse 87   Temp 98.2 F (36.8 C)   Resp 18   LMP 10/22/2020   SpO2 100%  Physical Exam Vitals and nursing note reviewed.  Constitutional:      General: She is not in acute distress.    Appearance: She is well-developed.  HENT:     Head: Normocephalic and atraumatic.  Eyes:     Conjunctiva/sclera: Conjunctivae normal.  Cardiovascular:     Rate and Rhythm: Normal rate and regular rhythm.     Heart sounds: No murmur heard. Pulmonary:     Effort: Pulmonary effort is normal. No respiratory distress.     Breath sounds: Normal breath sounds.  Abdominal:     Palpations: Abdomen is soft.     Tenderness: There is no  abdominal tenderness.  Musculoskeletal:     Cervical back: Neck supple.     Comments: Tenderness to palpation diffusely of anterior knee as well as popliteal area, with deep vein tenderness to palpation, along the calf.  She does also have tenderness to palpation, her buttocks, on the right side, with a positive straight leg raise.  5 out of 5 strength bilateral lower extremities.  Skin:    General: Skin is warm and dry.     Capillary Refill: Capillary refill takes less than 2 seconds.     Comments: No erythema, warmth, or rash present on bilateral lower extremities.  No pitting edema noted.  Neurological:     Mental Status: She is alert.  Psychiatric:        Mood and Affect: Mood normal.     ED Results / Procedures / Treatments   Labs (all labs  ordered are listed, but only abnormal results are displayed) Labs Reviewed - No data to display  EKG None  Radiology DG Knee Complete 4 Views Right Result Date: 02/26/2024 CLINICAL DATA:  pain, swelling per pt EXAM: RIGHT KNEE - COMPLETE 4+ VIEW COMPARISON:  May 06, 2020 FINDINGS: No acute fracture or dislocation. No joint effusion. Mild joint space loss of the medial and patellofemoral compartments. Tricompartmental osteophyte formation. Partially visualized, subcutaneous edema again noted in the calf. IMPRESSION: 1. No acute fracture or dislocation. 2. Mild osteoarthritis of the knee, unchanged. Electronically Signed   By: Rance Burrows M.D.   On: 02/26/2024 22:52   DG Tibia/Fibula Right Result Date: 02/26/2024 CLINICAL DATA:  pain, swelling per pt EXAM: RIGHT TIBIA AND FIBULA - 2 VIEW COMPARISON:  None Available. FINDINGS: No acute fracture or dislocation. There is no evidence of arthropathy or other focal bone abnormality. Moderate subcutaneous edema throughout the calf. IMPRESSION: Moderate subcutaneous edema throughout the calf. No acute fracture or malalignment of the tibia and fibula. Electronically Signed   By: Rance Burrows M.D.   On: 02/26/2024 22:50    Procedures Procedures    Medications Ordered in ED Medications  predniSONE  (DELTASONE ) tablet 60 mg (60 mg Oral Not Given 02/26/24 2240)  oxyCODONE -acetaminophen  (PERCOCET/ROXICET) 5-325 MG per tablet 1 tablet (1 tablet Oral Given 02/26/24 2240)    ED Course/ Medical Decision Making/ A&P                                 Medical Decision Making Patient is here for 2 different complaints 1 being right-sided back pain, that radiates down her leg, that is consistent with sciatica, that she has had in the past.  She has no red flag symptoms including trauma, fever, chills, loss of bowel, bladder.  She does not use any IV drugs and is not on any immunosuppressing agents.  We will treat Percocet, to help with the pain, she is on home  tramadol, declined prednisone , and is unable to take ibuprofen, given history of gastric bypass.  Additionally complains of some right knee pain, that is specifically more behind the posterior knee, in the calf.  She does have great degree of tenderness to palpation we will obtain x-rays for this, and unfortunately are unable to Hu obtain a ultrasound, to rule out a DVT.  She has no overlying erythema, and she agrees with me in regards to this.  Has not had any recent trauma.  She is okay with returning in the a.m. for an ultrasound thus that is  ordered to be completed  Amount and/or Complexity of Data Reviewed Radiology: ordered.    Details: X-ray of knee shows mild osteoarthritis of the knee unchanged, she does have moderate subcu edema throughout the calf, no acute fracture or malalignment of the tibia/fibula Discussion of management or test interpretation with external provider(s): Discussed with patient, she does have edema, of the right calf, where the pain is.  DVT ultrasound was ordered for outpatient, to be completed tomorrow.  She understands the risk of this, and is okay with this.  We discussed the risk and benefits, and unfortunately are unable to provide an ultrasound at this time, and she is agreeable to return in the a.m.  I instructed her on strict return precautions including shortness of breath, chest pain, she should return immediately.  She has no overlying erythema thus I think it is unlikely to be cellulitic, and she has not had any foreign bodies, or trauma to the area.  We discussed treatment of her sciatica, including Flexeril  and taking her home medications, for pain, and she is in agreement with this.  I also provided her with information, for sciatic rehab  Risk Prescription drug management.    Final Clinical Impression(s) / ED Diagnoses Final diagnoses:  Acute low back pain with right-sided sciatica, unspecified back pain laterality  Pain of right lower leg  Localized  edema    Rx / DC Orders ED Discharge Orders          Ordered    Lower Ext Right Venous US        Comments: IMPORTANT PATIENT INSTRUCTIONS:  You have been scheduled for an Outpatient Ultrasound.    Your appointment has been scheduled for:  _______ am/pm on _______________ (date).  If your appointment is scheduled for a Saturday, Sunday or holiday, please go to the [facility] Emergency Department Registration Desk at least 15 minutes prior to your appointment time and tell them you are there for an ultrasound.    If your appointment is scheduled for a weekday (Monday - Friday), please go directly to the [facility] Radiology Department reception area at least 15 minutes prior to your appointment time and tell them you are there for an ultrasound.  Please call [phone] with questions.   02/26/24 2309    cyclobenzaprine (FLEXERIL) 10 MG tablet  2 times daily PRN        05 /05/25 2311              Bartlett Enke, Dwaine Gip, [place] 02/26/24 2322    Almond Army, MD 03/01/24 1350

## 2024-02-26 NOTE — Discharge Instructions (Signed)
 You will need to come back to [facility] for an ultrasound in the AM.  It is possible that you have a blood clot in your leg, unfortunately we are unable to get a DVT study, at this time of night.  If you have chest pain, shortness of breath, of loss of sensation in your foot you need to come back to the ER immediately.  Please follow-up with your primary care doctor and take the Flexeril as needed, for the sciatica pain, and take your regular pain medications

## 2024-02-26 NOTE — ED Triage Notes (Signed)
 Low back right hip and right knee pain  Started yesterday.  Not relieved with home interventions

## 2024-02-27 ENCOUNTER — Ambulatory Visit (HOSPITAL_BASED_OUTPATIENT_CLINIC_OR_DEPARTMENT_OTHER)
Admission: RE | Admit: 2024-02-27 | Discharge: 2024-02-27 | Disposition: A | Discharge status: Home / Self Care | Source: Ambulatory Visit | Attending: Medical | Admitting: Medical

## 2024-02-27 DIAGNOSIS — M79661 Pain in right lower leg: Secondary | ICD-10-CM | POA: Diagnosis present

## 2024-02-27 DIAGNOSIS — R600 Localized edema: Secondary | ICD-10-CM | POA: Diagnosis present

## 2024-02-27 NOTE — ED Provider Notes (Signed)
 Patient returned to ED on 5/6 for DVT study, which I reviewed was negative.  Symptoms somewhat improved from yesterday.  Discharged with PCP follow-up.   Afton Horse T, DO 02/27/24 [phone]

## 2024-03-21 ENCOUNTER — Other Ambulatory Visit (HOSPITAL_COMMUNITY): Payer: Self-pay

## 2024-03-21 ENCOUNTER — Ambulatory Visit (INDEPENDENT_AMBULATORY_CARE_PROVIDER_SITE_OTHER): Admitting: Internal Medicine

## 2024-03-21 ENCOUNTER — Encounter (INDEPENDENT_AMBULATORY_CARE_PROVIDER_SITE_OTHER): Payer: Self-pay | Admitting: Internal Medicine

## 2024-03-21 VITALS — BP 129/83 | HR 95 | Temp 98.8°F | Ht 68.0 in | Wt >= 6400 oz

## 2024-03-21 DIAGNOSIS — Z903 Acquired absence of stomach [part of]: Secondary | ICD-10-CM

## 2024-03-21 DIAGNOSIS — R7303 Prediabetes: Secondary | ICD-10-CM

## 2024-03-21 DIAGNOSIS — E669 Obesity, unspecified: Secondary | ICD-10-CM | POA: Insufficient documentation

## 2024-03-21 DIAGNOSIS — Z6841 Body Mass Index (BMI) 40.0 and over, adult: Secondary | ICD-10-CM

## 2024-03-21 DIAGNOSIS — I10 Essential (primary) hypertension: Secondary | ICD-10-CM

## 2024-03-21 DIAGNOSIS — E66813 Obesity, class 3: Secondary | ICD-10-CM

## 2024-03-21 MED ORDER — NEBIVOLOL HCL 2.5 MG PO TABS
2.5000 mg | ORAL_TABLET | Freq: Every day | ORAL | 0 refills | Status: DC
  Filled 2024-03-21: qty 90, 90d supply, fill #0

## 2024-03-21 MED ORDER — WEGOVY 0.25 MG/0.5ML ~~LOC~~ SOAJ
0.2500 mg | SUBCUTANEOUS | 0 refills | Status: DC
  Filled 2024-03-21 – 2024-03-26 (×3): qty 2, 28d supply, fill #0

## 2024-03-21 NOTE — Progress Notes (Signed)
 Office: [phone]  /  Fax: [phone]  Weight Summary And Biometrics  Vitals Temp: 98.8 F (37.1 C) BP: 129/83 Pulse Rate: 95 SpO2: 99 %   Anthropometric Measurements Height: 5\' 8"  (1.727 m) Weight: (!) 423 lb (191.9 kg) BMI (Calculated): 64.33 Weight at Last Visit: 409 Weight Lost Since Last Visit: 0lb Weight Gained Since Last Visit: 14lb Starting Weight: 421lb Total Weight Loss (lbs): 8 lb (3.629 kg) Peak Weight: 546lb   Body Composition  Body Fat %: 64.3 % Fat Mass (lbs): 272.6 lbs Muscle Mass (lbs): 143.6 lbs Visceral Fat Rating : 30    RMR: 3096  Today's Visit #: 16  Starting Date: 11/22/22   Subjective   Chief Complaint: Obesity  Interval History Discussed the use of AI scribe software for clinical note transcription with the patient, who gave verbal consent to proceed.  History of Present Illness   Ashlee Gordon is a 53 year old female who presents for medical weight management.  She has experienced a significant weight gain of 14 pounds, primarily attributed to stress eating. This weight gain coincides with heightened emotional stress due to the recent passing of her parents and her sister's diagnosis of breast cancer. She believes that her use of a liquid multivitamin and Geritol has contributed to an increased appetite.  She has a history of gastric sleeve surgery and has been taking a liquid multivitamin to improve absorption. Her breakfast typically includes a protein supplement mixed with almond milk and cereal, which does not cause gastric distress.  She manages her blood pressure with losartan 50 mg and nebivolol  2.5 mg. She has noted that taking these medications in the morning has alleviated leg cramps.  She is also on amlodipine  She has recently hired a Systems analyst and plans to start a fitness program at a local fitness center, which includes access to various exercise facilities and classes. She is also interested in receiving a  nutrition plan to help manage her weight.  Family history is significant for breast cancer in her sister and maternal aunt. Her sister has multiple cancerous tumors in the breasts, and her maternal aunt had breast cancer but did not pass away from it.  No findings on recent mammograms.       Challenges affecting patient progress: moderate to high levels of stress, emotional eating, and history of gastric sleeve with metabolic adaptations.    Pharmacotherapy for weight management: She is currently taking no anti-obesity medication.   Assessment and Plan   Treatment Plan For Obesity:  Recommended Dietary Goals  Ashlee Gordon is currently in the action stage of change. As such, her goal is to continue weight management plan. She has agreed to: follow a tailored, multi-day, low carbohydrate, high protein plan targeting 1800 calories and 120 250 g grams of protein per day  Behavioral Health and Counseling  We discussed the following behavioral modification strategies today: continue to work on maintaining a reduced calorie state, getting the recommended amount of protein, incorporating whole foods, making healthy choices, staying well hydrated and practicing mindfulness when eating..  Additional education and resources provided today: None  Recommended Physical Activity Goals  Ashlee Gordon has been advised to work up to 150 minutes of moderate intensity aerobic activity a week and strengthening exercises 2-3 times per week for cardiovascular health, weight loss maintenance and preservation of muscle mass.   She has agreed to :  Think about enjoyable ways to increase daily physical activity and overcoming barriers to exercise and Increase physical  activity in their day and reduce sedentary time (increase NEAT).  Pharmacotherapy  We discussed various medication options to help Ashlee Gordon with her weight loss efforts and we both agreed to : start anti-obesity medication.  In addition to reduced calorie  nutrition plan (RCNP), behavioral strategies and physical activity, Ashlee Gordon would benefit from pharmacotherapy to assist with hunger signals, satiety and cravings. This will reduce obesity-related health risks by inducing weight loss, and help reduce food consumption and adherence to [employer]) . It may also improve QOL by improving self-confidence and reduce the  setbacks associated with metabolic adaptations.  After discussion of treatment options, mechanisms of action, benefits, side effects, contraindications and shared decision making she is agreeable to starting Wegovy  0.25 mg once a week. Patient also made aware that medication is indicated for long-term management of obesity and the risk of weight regain following discontinuation of treatment and hence the importance of adhering to medical weight loss plan.  We demonstrated use of device and patient using teach back method was able to demonstrate proper technique.  Associated Conditions Impacted by Obesity Treatment  Class 3 severe obesity with serious comorbidity and body mass index (BMI) of 60.0 to 69.9 in adult, unspecified obesity type -     Wegovy ; Inject 0.25 mg into the skin once a week.  Dispense: 2 mL; Refill: 0  Primary hypertension -     Nebivolol  HCl; Take 1 tablet (2.5 mg total) by mouth daily.  Dispense: 90 tablet; Refill: 0  Prediabetes  H/O gastric sleeve     Assessment and Plan    Obesity/history of gastric sleeve Obesity with recent 14-pound weight gain, likely due to stress eating. She has been off Wegovy , reporting increased appetite with liquid vitamins. Plans to restart Wegovy  at 2.5 mg for appetite control. Engaged in lifestyle changes, including hiring a Systems analyst and starting a fitness program at [employer] in [place]. Discussed the importance of a high-protein, high-fiber diet and provided an 1800-calorie meal plan. She is motivated to resume weight management. - Restart Wegovy  at 2.5 mg. - Provide  1800-calorie, high-protein, high-fiber meal plan. - Encourage continuation of personal training and fitness program.  Prediabetes Did not tolerate metformin  in the past she is high risk for progression and would benefit from restarting GLP-1 therapy.  She will be started on Wegovy  0.25 mg once a week  Hypertension Hypertension managed with losartan 50 mg, amlodipine and nebivolol  2.5 mg.  She had reported leg cramps on the Nebivolol .  Blood pressure is well-controlled with the current regimen. - Continue losartan 50 mg daily, amlodipine 10. - Continue nebivolol  2.5 mg daily. - Send 90-day supply of nebivolol  to pharmacy.         Objective   Physical Exam:  Blood pressure 129/83, pulse 95, temperature 98.8 F (37.1 C), height 5\' 8"  (1.727 m), weight (!) 423 lb (191.9 kg), last menstrual period 10/22/2020, SpO2 99%. Body mass index is 64.32 kg/m.  General: She is overweight, cooperative, alert, well developed, and in no acute distress. PSYCH: Has normal mood, affect and thought process.   HEENT: EOMI, sclerae are anicteric. Lungs: Normal breathing effort, no conversational dyspnea. Extremities: No edema.  Neurologic: No gross sensory or motor deficits. No tremors or fasciculations noted.    Diagnostic Data Reviewed:  BMET    Component Value Date/Time   NA 141 11/22/2022 1016   K 4.1 11/22/2022 1016   CL 101 11/22/2022 1016   CO2 23 11/22/2022 1016   GLUCOSE 94 11/22/2022 1016  GLUCOSE 110 (H) 08/08/2022 1805   BUN 15 11/22/2022 1016   CREATININE 0.92 11/22/2022 1016   CALCIUM 9.0 11/22/2022 1016   GFRNONAA 36 (L) 08/08/2022 1805   Lab Results  Component Value Date   HGBA1C 6.1 (H) 07/24/2023   HGBA1C 6.1 (H) 11/22/2022   Lab Results  Component Value Date   INSULIN  37.9 (H) 11/22/2022   Lab Results  Component Value Date   TSH 1.220 11/22/2022   CBC    Component Value Date/Time   WBC 13.9 (H) 11/22/2022 1016   WBC 9.1 08/08/2022 1805   RBC 4.78  11/22/2022 1016   RBC 4.47 08/08/2022 1805   HGB 12.8 11/22/2022 1016   HCT 41.0 11/22/2022 1016   PLT 183 08/08/2022 1805   MCV 86 11/22/2022 1016   MCH 26.8 11/22/2022 1016   MCH 27.5 08/08/2022 1805   MCHC 31.2 (L) 11/22/2022 1016   MCHC 32.1 08/08/2022 1805   RDW 13.8 11/22/2022 1016   Iron Studies No results found for: "IRON", "TIBC", "FERRITIN", "IRONPCTSAT" Lipid Panel     Component Value Date/Time   CHOL 185 11/22/2022 1016   TRIG 74 11/22/2022 1016   HDL 54 11/22/2022 1016   LDLCALC 117 (H) 11/22/2022 1016   Hepatic Function Panel     Component Value Date/Time   PROT 7.5 11/22/2022 1016   ALBUMIN 4.0 11/22/2022 1016   AST 16 11/22/2022 1016   ALT 14 11/22/2022 1016   ALKPHOS 86 11/22/2022 1016   BILITOT 0.2 11/22/2022 1016      Component Value Date/Time   TSH 1.220 11/22/2022 1016   Nutritional Lab Results  Component Value Date   VD25OH 27.8 (L) 07/24/2023   VD25OH 13.9 (L) 11/22/2022    Medications: Outpatient Encounter Medications as of 03/21/2024  Medication Sig Note   CAPSAICIN HEAT PATCH EX Apply topically.    Cyanocobalamin (B-12 COMPLIANCE INJECTION) 1000 MCG/ML KIT Inject as directed.    cyclobenzaprine  (FLEXERIL ) 10 MG tablet Take 1 tablet (10 mg total) by mouth 2 (two) times daily as needed for muscle spasms.    ergocalciferol  (VITAMIN D2) 1.25 MG (50000 UT) capsule Take 1 capsule (50,000 Units total) by mouth once a week.    losartan (COZAAR) 50 MG tablet Take 50 mg by mouth daily.    Multiple Vitamin (MULTIVITAMIN ADULT PO) Take by mouth.    Semaglutide -Weight Management (WEGOVY ) 0.25 MG/0.5ML SOAJ Inject 0.25 mg into the skin once a week.    traMADol HCl 100 MG TABS Take by mouth.    [DISCONTINUED] losartan (COZAAR) 100 MG tablet Take 100 mg by mouth daily. 03/21/2024: updated dose, managed by PCP   amLODipine (NORVASC) 10 MG tablet Take 10 mg by mouth daily. (Patient not taking: Reported on 03/21/2024)    Cholecalciferol (VITAMIN D3) 50 MCG  (2000 UT) capsule Take 1 capsule (2,000 Units total) by mouth daily. (Patient not taking: Reported on 03/21/2024)    nebivolol  (BYSTOLIC ) 2.5 MG tablet Take 1 tablet (2.5 mg total) by mouth daily.    predniSONE  (DELTASONE ) 10 MG tablet Take by mouth. (Patient not taking: Reported on 03/21/2024)    No facility-administered encounter medications on file as of 03/21/2024.     Follow-Up   Return in about 4 weeks (around 04/18/2024) for For Weight Mangement with Dr. Allie Area.Aaron Aas She was informed of the importance of frequent follow up visits to maximize her success with intensive lifestyle modifications for her multiple health conditions.  Attestation Statement   Reviewed by clinician on day  of visit: allergies, medications, problem list, medical history, surgical history, family history, social history, and previous encounter notes.     Ladd Picker, MD

## 2024-03-25 ENCOUNTER — Telehealth (INDEPENDENT_AMBULATORY_CARE_PROVIDER_SITE_OTHER): Payer: Self-pay

## 2024-03-25 NOTE — Telephone Encounter (Signed)
 Ashlee Gordon (Key: [id number]) Wegovy  0.25MG /0.5ML auto-injectors

## 2024-03-26 ENCOUNTER — Other Ambulatory Visit (HOSPITAL_COMMUNITY): Payer: Self-pay

## 2024-04-30 ENCOUNTER — Ambulatory Visit (INDEPENDENT_AMBULATORY_CARE_PROVIDER_SITE_OTHER): Admitting: Internal Medicine

## 2024-04-30 ENCOUNTER — Other Ambulatory Visit (HOSPITAL_COMMUNITY): Payer: Self-pay

## 2024-04-30 ENCOUNTER — Encounter (INDEPENDENT_AMBULATORY_CARE_PROVIDER_SITE_OTHER): Payer: Self-pay | Admitting: Internal Medicine

## 2024-04-30 VITALS — BP 132/82 | HR 85 | Temp 98.2°F | Ht 68.0 in | Wt >= 6400 oz

## 2024-04-30 DIAGNOSIS — R7303 Prediabetes: Secondary | ICD-10-CM | POA: Diagnosis not present

## 2024-04-30 DIAGNOSIS — I10 Essential (primary) hypertension: Secondary | ICD-10-CM

## 2024-04-30 DIAGNOSIS — E66813 Obesity, class 3: Secondary | ICD-10-CM

## 2024-04-30 DIAGNOSIS — G4733 Obstructive sleep apnea (adult) (pediatric): Secondary | ICD-10-CM | POA: Diagnosis not present

## 2024-04-30 DIAGNOSIS — Z7985 Long-term (current) use of injectable non-insulin antidiabetic drugs: Secondary | ICD-10-CM

## 2024-04-30 DIAGNOSIS — E559 Vitamin D deficiency, unspecified: Secondary | ICD-10-CM | POA: Diagnosis not present

## 2024-04-30 DIAGNOSIS — Z6841 Body Mass Index (BMI) 40.0 and over, adult: Secondary | ICD-10-CM

## 2024-04-30 DIAGNOSIS — Z903 Acquired absence of stomach [part of]: Secondary | ICD-10-CM

## 2024-04-30 DIAGNOSIS — R4589 Other symptoms and signs involving emotional state: Secondary | ICD-10-CM

## 2024-04-30 MED ORDER — BUPROPION HCL ER (XL) 150 MG PO TB24
150.0000 mg | ORAL_TABLET | Freq: Every day | ORAL | 0 refills | Status: DC
  Filled 2024-04-30: qty 90, 90d supply, fill #0

## 2024-04-30 MED ORDER — WEGOVY 0.5 MG/0.5ML ~~LOC~~ SOAJ
0.5000 mg | SUBCUTANEOUS | 0 refills | Status: DC
  Filled 2024-04-30: qty 2, 28d supply, fill #0

## 2024-04-30 NOTE — Assessment & Plan Note (Signed)
 She is scheduled to have disease monitoring labs at her PCPs office and will bring these in at the next office visit.  She is now on Wegovy  for pharmacoprophylaxis tolerating medication well without any adverse effects.  Medication will be increased to 0.5 mg once a week

## 2024-04-30 NOTE — Assessment & Plan Note (Signed)
 Following up on weight management post-gastric sleeve surgery. Restarted on Wegovy  0.25 weekly during the last visit. No constipation, nausea, or vomiting reported. Difficulty maintaining an exercise routine due to personal stressors. Wegovy  may aid in weight loss and potentially reduce hypertension. - Increase Wegovy  to 0.5 mg weekly - Encourage resumption of exercise routine, particularly water exercises

## 2024-04-30 NOTE — Assessment & Plan Note (Signed)
 Untreated.  Intolerance to CPAP.  May affect weight and blood pressure control.  Counseled on risk associated with untreated sleep apnea.  Losing 15% of body weight may improve condition.

## 2024-04-30 NOTE — Assessment & Plan Note (Signed)
 She had gastric sleeve in 2015 her preoperative weight was 536 with a nadir of 418 and weight regain.  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  She has associated vitamin D  deficiency and is on high-dose vitamin D  supplementation.  She is now on Wegovy  which should help with metabolic adaptations.

## 2024-04-30 NOTE — Assessment & Plan Note (Signed)
 On high-dose vitamin D  supplementation.  We will await test results.  We tried over-the-counter supplementation which was not sufficient.

## 2024-04-30 NOTE — Progress Notes (Signed)
 Patient presents today for medical weight management  Office: [phone]  /  Fax: [phone]  Weight Summary and Body Composition Analysis (BIA)  Vitals Temp: 98.2 F (36.8 C) BP: 132/82 Pulse Rate: 85 SpO2: 99 %   Anthropometric Measurements Height: 5' 8 (1.727 m) Weight: (!) 422 lb (191.4 kg) BMI (Calculated): 64.18 Weight at Last Visit: 423 lb Weight Lost Since Last Visit: 1 lb Weight Gained Since Last Visit: 0 lb Starting Weight: 421 lb Total Weight Loss (lbs): 0 lb (0 kg) Peak Weight: 546 lb   Body Composition  Body Fat %: 62.4 % Fat Mass (lbs): 263.8 lbs Muscle Mass (lbs): 151 lbs Visceral Fat Rating : 29    RMR: 3096  Today's Visit #: 17  Starting Date: 11/22/22   Subjective   Chief Complaint: Obesity  Interval History Discussed the use of AI scribe software for clinical note transcription with the patient, who gave verbal consent to proceed.  History of Present Illness   Ashlee Gordon is a 53 year old female with a history of gastric sleeve, prediabetes, obstructive sleep apnea, and hypertension who presents for follow-up on Wegovy  treatment.  She restarted Wegovy  at a dose of 0.5 mg once a week and has not experienced constipation, nausea, or vomiting. She describes her weight loss as 'okay'.  She is currently taking losartan and Bystolic  for hypertension but has not been taking amlodipine. She regularly monitors her blood pressure at home.  She is experiencing significant stress due to family issues, including her sister's breast cancer diagnosis and her brother's severe burns from an accident. She feels like she is in 'robot mode' and acknowledges not having grieved her parents' deaths in 2010. She has been the responsible one in her family, which has added to her stress.  She has a history of depression and previously found Wellbutrin  helpful, but she is not currently on any antidepressants. She has been seeing a therapist but needs to find a  new one as her previous therapist is no longer available.  She has stopped her water exercise routine due to lack of motivation and energy, which she attributes to her current emotional state. She has a Systems analyst who has checked in on her, and she plans to resume sessions soon.  She had recent blood work with several tubes drawn but has not yet received the results. She had an abnormal breast screening two weeks ago, but a follow-up ultrasound showed it was just a lymph node. Her sister's breast cancer is not genetic.       Challenges affecting patient progress: low volume of physical activity at present  and recent lapse due to mental health.    Pharmacotherapy for weight management: She is currently taking Wegovy  with adequate clinical response  and without side effects..   Assessment and Plan   Treatment Plan For Obesity:  Recommended Dietary Goals  Ashlee Gordon is currently in the action stage of change. As such, her goal is to continue weight management plan. She has agreed to: continue current plan  Behavioral Health and Counseling  We discussed the following behavioral modification strategies today: continue to work on maintaining a reduced calorie state, getting the recommended amount of protein, incorporating whole foods, making healthy choices, staying well hydrated and practicing mindfulness when eating..  Additional education and resources provided today: None  Recommended Physical Activity Goals  Ashlee Gordon has been advised to work up to 150 minutes of moderate intensity aerobic activity a week and strengthening exercises 2-3 times per  week for cardiovascular health, weight loss maintenance and preservation of muscle mass.   She has agreed to :  Think about enjoyable ways to increase daily physical activity and overcoming barriers to exercise and Increase physical activity in their day and reduce sedentary time (increase NEAT).  Medical Interventions and  Pharmacotherapy  We discussed various medication options to help Ashlee Gordon with her weight loss efforts and we both agreed to : Increase Wegovy  to .5 mg once a week patient tolerating medication well without any adverse effects  Associated Conditions Impacted by Obesity Treatment  Assessment & Plan Class 3 severe obesity with serious comorbidity and body mass index (BMI) of 60.0 to 69.9 in adult Following up on weight management post-gastric sleeve surgery. Restarted on Wegovy  0.25 weekly during the last visit. No constipation, nausea, or vomiting reported. Difficulty maintaining an exercise routine due to personal stressors. Wegovy  may aid in weight loss and potentially reduce hypertension. - Increase Wegovy  to 0.5 mg weekly - Encourage resumption of exercise routine, particularly water exercises OSA (obstructive sleep apnea) Untreated.  Intolerance to CPAP.  May affect weight and blood pressure control.  Counseled on risk associated with untreated sleep apnea.  Losing 15% of body weight may improve condition.   Prediabetes She is scheduled to have disease monitoring labs at her PCPs office and will bring these in at the next office visit.  She is now on Wegovy  for pharmacoprophylaxis tolerating medication well without any adverse effects.  Medication will be increased to 0.5 mg once a week H/O gastric sleeve She had gastric sleeve in 2015 her preoperative weight was 536 with a nadir of 418 and weight regain.  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  She has associated vitamin D  deficiency and is on high-dose vitamin D  supplementation.  She is now on Wegovy  which should help with metabolic adaptations. Primary hypertension She has not been taking amlodipine currently taking ARB and Bystolic  blood pressure under reasonable control.  She is being started on Wellbutrin  for depressed mood we need to monitor her blood pressure and heart rate closely. Vitamin D   deficiency On high-dose vitamin D  supplementation.  We will await test results.  We tried over-the-counter supplementation which was not sufficient. Depressed mood Reports depression exacerbated by recent family stressors and anniversaries of parental deaths. Previously on Wellbutrin , which was beneficial. Currently not on antidepressants. Plans to re-engage with therapy. Wellbutrin  may improve mood and energy but requires monitoring for increased anxiety and elevated blood pressure. - Prescribe Wellbutrin  extended-release, one tablet daily - Monitor blood pressure regularly due to potential increase from Wellbutrin  - Encourage re-engagement with therapy at [facility] - Discuss potential side effects of Wellbutrin , including increased anxiety and elevated blood pressure        Objective   Physical Exam:  Blood pressure 132/82, pulse 85, temperature 98.2 F (36.8 C), height 5' 8 (1.727 m), weight (!) 422 lb (191.4 kg), last menstrual period 10/22/2020, SpO2 99%. Body mass index is 64.16 kg/m.  General: She is overweight, cooperative, alert, well developed, and in no acute distress. PSYCH: Has normal mood, affect and thought process.   HEENT: EOMI, sclerae are anicteric. Lungs: Normal breathing effort, no conversational dyspnea. Extremities: No edema.  Neurologic: No gross sensory or motor deficits. No tremors or fasciculations noted.    Diagnostic Data Reviewed:  BMET    Component Value Date/Time   NA 141 11/22/2022 1016   K 4.1 11/22/2022 1016   CL 101 11/22/2022 1016   CO2  23 11/22/2022 1016   GLUCOSE 94 11/22/2022 1016   GLUCOSE 110 (H) 08/08/2022 1805   BUN 15 11/22/2022 1016   CREATININE 0.92 11/22/2022 1016   CALCIUM 9.0 11/22/2022 1016   GFRNONAA 36 (L) 08/08/2022 1805   Lab Results  Component Value Date   HGBA1C 6.1 (H) 07/24/2023   HGBA1C 6.1 (H) 11/22/2022   Lab Results  Component Value Date   INSULIN  37.9 (H) 11/22/2022   Lab Results  Component Value  Date   TSH 1.220 11/22/2022   CBC    Component Value Date/Time   WBC 13.9 (H) 11/22/2022 1016   WBC 9.1 08/08/2022 1805   RBC 4.78 11/22/2022 1016   RBC 4.47 08/08/2022 1805   HGB 12.8 11/22/2022 1016   HCT 41.0 11/22/2022 1016   PLT 183 08/08/2022 1805   MCV 86 11/22/2022 1016   MCH 26.8 11/22/2022 1016   MCH 27.5 08/08/2022 1805   MCHC 31.2 (L) 11/22/2022 1016   MCHC 32.1 08/08/2022 1805   RDW 13.8 11/22/2022 1016   Iron Studies No results found for: IRON, TIBC, FERRITIN, IRONPCTSAT Lipid Panel     Component Value Date/Time   CHOL 185 11/22/2022 1016   TRIG 74 11/22/2022 1016   HDL 54 11/22/2022 1016   LDLCALC 117 (H) 11/22/2022 1016   Hepatic Function Panel     Component Value Date/Time   PROT 7.5 11/22/2022 1016   ALBUMIN 4.0 11/22/2022 1016   AST 16 11/22/2022 1016   ALT 14 11/22/2022 1016   ALKPHOS 86 11/22/2022 1016   BILITOT 0.2 11/22/2022 1016      Component Value Date/Time   TSH 1.220 11/22/2022 1016   Nutritional Lab Results  Component Value Date   VD25OH 27.8 (L) 07/24/2023   VD25OH 13.9 (L) 11/22/2022    Medications: Outpatient Encounter Medications as of 04/30/2024  Medication Sig Note   buPROPion  (WELLBUTRIN  XL) 150 MG 24 hr tablet Take 1 tablet (150 mg total) by mouth daily.    Semaglutide -Weight Management (WEGOVY ) 0.5 MG/0.5ML SOAJ Inject 0.5 mg into the skin once a week.    CAPSAICIN HEAT PATCH EX Apply topically.    Cholecalciferol (VITAMIN D3) 50 MCG (2000 UT) capsule Take 1 capsule (2,000 Units total) by mouth daily. (Patient not taking: Reported on 03/21/2024)    Cyanocobalamin (B-12 COMPLIANCE INJECTION) 1000 MCG/ML KIT Inject as directed.    cyclobenzaprine  (FLEXERIL ) 10 MG tablet Take 1 tablet (10 mg total) by mouth 2 (two) times daily as needed for muscle spasms.    ergocalciferol  (VITAMIN D2) 1.25 MG (50000 UT) capsule Take 1 capsule (50,000 Units total) by mouth once a week.    losartan (COZAAR) 50 MG tablet Take 50 mg by  mouth daily.    Multiple Vitamin (MULTIVITAMIN ADULT PO) Take by mouth.    nebivolol  (BYSTOLIC ) 2.5 MG tablet Take 1 tablet (2.5 mg total) by mouth daily.    traMADol HCl 100 MG TABS Take by mouth.    [DISCONTINUED] amLODipine (NORVASC) 10 MG tablet Take 10 mg by mouth daily. (Patient not taking: Reported on 03/21/2024) 04/30/2024: Patient not taking   [DISCONTINUED] predniSONE  (DELTASONE ) 10 MG tablet Take by mouth. (Patient not taking: Reported on 03/21/2024)    [DISCONTINUED] Semaglutide -Weight Management (WEGOVY ) 0.25 MG/0.5ML SOAJ Inject 0.25 mg into the skin once a week.    No facility-administered encounter medications on file as of 04/30/2024.     Follow-Up   Return in about 4 weeks (around 05/28/2024) for For Weight Mangement with Dr.  Tyresse Gordon She was informed of the importance of frequent follow up visits to maximize her success with intensive lifestyle modifications for her multiple health conditions.  Attestation Statement   Reviewed by clinician on day of visit: allergies, medications, problem list, medical history, surgical history, family history, social history, and previous encounter notes.     Lucas Parker, MD

## 2024-04-30 NOTE — Assessment & Plan Note (Signed)
 She has not been taking amlodipine currently taking ARB and Bystolic  blood pressure under reasonable control.  She is being started on Wellbutrin  for depressed mood we need to monitor her blood pressure and heart rate closely.

## 2024-05-01 ENCOUNTER — Other Ambulatory Visit (HOSPITAL_COMMUNITY): Payer: Self-pay

## 2024-05-29 ENCOUNTER — Encounter (INDEPENDENT_AMBULATORY_CARE_PROVIDER_SITE_OTHER): Payer: Self-pay | Admitting: Internal Medicine

## 2024-05-29 ENCOUNTER — Other Ambulatory Visit (HOSPITAL_COMMUNITY): Payer: Self-pay

## 2024-05-29 ENCOUNTER — Ambulatory Visit (INDEPENDENT_AMBULATORY_CARE_PROVIDER_SITE_OTHER): Admitting: Internal Medicine

## 2024-05-29 VITALS — BP 140/86 | HR 82 | Temp 98.3°F | Ht 68.0 in | Wt >= 6400 oz

## 2024-05-29 DIAGNOSIS — E66813 Obesity, class 3: Secondary | ICD-10-CM | POA: Diagnosis not present

## 2024-05-29 DIAGNOSIS — R110 Nausea: Secondary | ICD-10-CM | POA: Diagnosis not present

## 2024-05-29 DIAGNOSIS — R4589 Other symptoms and signs involving emotional state: Secondary | ICD-10-CM

## 2024-05-29 DIAGNOSIS — R7303 Prediabetes: Secondary | ICD-10-CM

## 2024-05-29 DIAGNOSIS — G4733 Obstructive sleep apnea (adult) (pediatric): Secondary | ICD-10-CM | POA: Diagnosis not present

## 2024-05-29 DIAGNOSIS — Z6841 Body Mass Index (BMI) 40.0 and over, adult: Secondary | ICD-10-CM

## 2024-05-29 DIAGNOSIS — Z903 Acquired absence of stomach [part of]: Secondary | ICD-10-CM

## 2024-05-29 MED ORDER — ONDANSETRON 4 MG PO TBDP
4.0000 mg | ORAL_TABLET | Freq: Three times a day (TID) | ORAL | 0 refills | Status: DC | PRN
  Filled 2024-05-29: qty 30, 10d supply, fill #0

## 2024-05-29 MED ORDER — WEGOVY 1 MG/0.5ML ~~LOC~~ SOAJ
1.0000 mg | SUBCUTANEOUS | 0 refills | Status: DC
  Filled 2024-05-29: qty 2, 28d supply, fill #0

## 2024-05-29 NOTE — Assessment & Plan Note (Signed)
 She had gastric sleeve in 2015 her preoperative weight was 536 with a nadir of 418 and weight regain.  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  We discussed different strategies to help her lose weight as well as limitations.  We are currently working on GLP-1 aided weight loss.  We discussed possible revision surgery with a Roux-en-Y after losing 10 to 15% which is what would be expected with Wegovy  and lifestyle changes.  Problem is patient inconsistency due to family matters and other external factors influencing her motivation.

## 2024-05-29 NOTE — Assessment & Plan Note (Signed)
 Obesity post-gastric sleeve surgery Obesity persists with recent weight gain of two pounds. Current BMI remains high. Consideration for Roux-en-Y gastric bypass if weight loss goals are unmet with current management. Wegovy  increased to target 10-15% weight loss. Discussed Roux-en-Y benefits, achieving 30-40% weight loss, but requires lifelong vitamin supplementation. She is not interested in surgery currently. - Increase Wegovy  to 1 mg - Educate on Roux-en-Y gastric bypass as a potential future option - Encourage adherence to 1800 calorie nutrition plan - Encourage resumption of physical activity

## 2024-05-29 NOTE — Assessment & Plan Note (Signed)
 Untreated.  Intolerance to CPAP.  May affect weight and blood pressure control.  Counseled on risk associated with untreated sleep apnea.  Losing 15% of body weight may improve condition.  Continue with weight loss aided by GLP-1

## 2024-05-29 NOTE — Assessment & Plan Note (Signed)
 Now on Wegovy  for pharmacoprophylaxis medication will be increased to 1 mg a week

## 2024-05-29 NOTE — Progress Notes (Signed)
 Office: [phone]  /  Fax: [phone]  Weight Summary and Body Composition Analysis (BIA)  Vitals Temp: 98.3 F (36.8 C) BP: (!) 140/86 Pulse Rate: 82 SpO2: 97 %   Anthropometric Measurements Height: 5' 8 (1.727 m) Weight: (!) 424 lb (192.3 kg) BMI (Calculated): 64.48 Weight at Last Visit: 422 lb Weight Lost Since Last Visit: 0 lb Weight Gained Since Last Visit: 2 lb Starting Weight: 421 lb Total Weight Loss (lbs): 0 lb (0 kg) Peak Weight: 546 lb   Body Composition  Body Fat %: 62.4 % Fat Mass (lbs): 265 lbs Muscle Mass (lbs): 151.8 lbs Visceral Fat Rating : 29    RMR: 3096  Today's Visit #: 18  Starting Date: 11/22/22   Subjective   Chief Complaint: Obesity  Interval History Discussed the use of AI scribe software for clinical note transcription with the patient, who gave verbal consent to proceed.  History of Present Illness   Ashlee Gordon is a 52 year old female with a history of gastric sleeve, prediabetes, sleep apnea, and hypertension who presents for medical weight management.  She is experiencing weight gain, having gained two pounds since her last visit. She follows an 1800 calorie nutrition plan but has suboptimal adherence. She has not been exercising due to difficulties in her life, leading to a lapse in treatment.  She has a history of prediabetes and hypertension. She has not missed any doses of her blood pressure medication and has no palpitations at home.  She was started on Wellbutrin  extended release during her last visit due to symptoms of depression. The medication has been helpful, and she feels better. She is currently taking Wellbutrin  and has a prescription for Wegovy , which was increased as part of her weight management plan. She experiences some nausea, which she attributes to her medications, but it is not overwhelming and occurs randomly.  Her brother recently had a stroke, which has been a source of stress for her. He is  currently in the ICU in [place] , and she has been in contact with family members regarding his condition. His daughter is with him now, which has alleviated some of her stress.       Challenges affecting patient progress: High levels of stress, although none mindset, history of gastric sleeve, high body fat percentage, low volume of physical activity has stopped exercising.   Pharmacotherapy for weight management: She is currently taking Wegovy  with adequate clinical response  and without side effects..   Assessment and Plan   Treatment Plan For Obesity:  Recommended Dietary Goals  Ashlee Gordon is currently in the action stage of change. As such, her goal is to continue weight management plan. She has agreed to: continue current plan  Behavioral Health and Counseling  We discussed the following behavioral modification strategies today: continue to work on maintaining a reduced calorie state, getting the recommended amount of protein, incorporating whole foods, making healthy choices, staying well hydrated and practicing mindfulness when eating., avoid all or none thinking, and rethink your why .  Additional education and resources provided today: None  Recommended Physical Activity Goals  Ashlee Gordon has been advised to work up to 150 minutes of moderate intensity aerobic activity a week and strengthening exercises 2-3 times per week for cardiovascular health, weight loss maintenance and preservation of muscle mass.   She has agreed to :  Think about enjoyable ways to increase daily physical activity and overcoming barriers to exercise and Increase physical activity in their day and reduce sedentary  time (increase NEAT).  Medical Interventions and Pharmacotherapy  We discussed various medication options to help Ashlee Gordon with her weight loss efforts and we both agreed to : Increase Wegovy  to 1 mg once a week  Associated Conditions Impacted by Obesity Treatment  Assessment & Plan Class 3  severe obesity with serious comorbidity and body mass index (BMI) of 60.0 to 69.9 in adult Obesity post-gastric sleeve surgery Obesity persists with recent weight gain of two pounds. Current BMI remains high. Consideration for Roux-en-Y gastric bypass if weight loss goals are unmet with current management. Wegovy  increased to target 10-15% weight loss. Discussed Roux-en-Y benefits, achieving 30-40% weight loss, but requires lifelong vitamin supplementation. She is not interested in surgery currently. - Increase Wegovy  to 1 mg - Educate on Roux-en-Y gastric bypass as a potential future option - Encourage adherence to 1800 calorie nutrition plan - Encourage resumption of physical activity OSA (obstructive sleep apnea) Untreated.  Intolerance to CPAP.  May affect weight and blood pressure control.  Counseled on risk associated with untreated sleep apnea.  Losing 15% of body weight may improve condition.  Continue with weight loss aided by GLP-1 H/O gastric sleeve She had gastric sleeve in 2015 her preoperative weight was 536 with a nadir of 418 and weight regain.  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  We discussed different strategies to help her lose weight as well as limitations.  We are currently working on GLP-1 aided weight loss.  We discussed possible revision surgery with a Roux-en-Y after losing 10 to 15% which is what would be expected with Wegovy  and lifestyle changes.  Problem is patient inconsistency due to family matters and other external factors influencing her motivation. Nausea Was given ondansetron  to use as needed for GLP-1 related nausea Prediabetes Now on Wegovy  for pharmacoprophylaxis medication will be increased to 1 mg a week Depressed mood Improved on Wellbutrin  but her blood pressure is elevated today she states monitoring blood pressure at home and has been well-controlled.  Recommend close monitoring of her blood pressure continue current  dose of Wellbutrin          Objective   Physical Exam:  Blood pressure (!) 140/86, pulse 82, temperature 98.3 F (36.8 C), height 5' 8 (1.727 m), weight (!) 424 lb (192.3 kg), last menstrual period 10/22/2020, SpO2 97%. Body mass index is 64.47 kg/m.  General: She is overweight, cooperative, alert, well developed, and in no acute distress. PSYCH: Has normal mood, affect and thought process.   HEENT: EOMI, sclerae are anicteric. Lungs: Normal breathing effort, no conversational dyspnea. Extremities: No edema.  Neurologic: No gross sensory or motor deficits. No tremors or fasciculations noted.    Diagnostic Data Reviewed:  BMET    Component Value Date/Time   NA 141 11/22/2022 1016   K 4.1 11/22/2022 1016   CL 101 11/22/2022 1016   CO2 23 11/22/2022 1016   GLUCOSE 94 11/22/2022 1016   GLUCOSE 110 (H) 08/08/2022 1805   BUN 15 11/22/2022 1016   CREATININE 0.92 11/22/2022 1016   CALCIUM 9.0 11/22/2022 1016   GFRNONAA 36 (L) 08/08/2022 1805   Lab Results  Component Value Date   HGBA1C 6.1 (H) 07/24/2023   HGBA1C 6.1 (H) 11/22/2022   Lab Results  Component Value Date   INSULIN  37.9 (H) 11/22/2022   Lab Results  Component Value Date   TSH 1.220 11/22/2022   CBC    Component Value Date/Time   WBC 13.9 (H) 11/22/2022 1016   WBC  9.1 08/08/2022 1805   RBC 4.78 11/22/2022 1016   RBC 4.47 08/08/2022 1805   HGB 12.8 11/22/2022 1016   HCT 41.0 11/22/2022 1016   PLT 183 08/08/2022 1805   MCV 86 11/22/2022 1016   MCH 26.8 11/22/2022 1016   MCH 27.5 08/08/2022 1805   MCHC 31.2 (L) 11/22/2022 1016   MCHC 32.1 08/08/2022 1805   RDW 13.8 11/22/2022 1016   Iron Studies No results found for: IRON, TIBC, FERRITIN, IRONPCTSAT Lipid Panel     Component Value Date/Time   CHOL 185 11/22/2022 1016   TRIG 74 11/22/2022 1016   HDL 54 11/22/2022 1016   LDLCALC 117 (H) 11/22/2022 1016   Hepatic Function Panel     Component Value Date/Time   PROT 7.5 11/22/2022  1016   ALBUMIN 4.0 11/22/2022 1016   AST 16 11/22/2022 1016   ALT 14 11/22/2022 1016   ALKPHOS 86 11/22/2022 1016   BILITOT 0.2 11/22/2022 1016      Component Value Date/Time   TSH 1.220 11/22/2022 1016   Nutritional Lab Results  Component Value Date   VD25OH 27.8 (L) 07/24/2023   VD25OH 13.9 (L) 11/22/2022    Medications: Outpatient Encounter Medications as of 05/29/2024  Medication Sig   ondansetron  (ZOFRAN -ODT) 4 MG disintegrating tablet Dissolve 1 tablet (4 mg total) by mouth every 8 (eight) hours as needed for nausea or vomiting.   semaglutide -weight management (WEGOVY ) 1 MG/0.5ML SOAJ SQ injection Inject 1 mg into the skin once a week.   buPROPion  (WELLBUTRIN  XL) 150 MG 24 hr tablet Take 1 tablet (150 mg total) by mouth daily.   CAPSAICIN HEAT PATCH EX Apply topically.   Cholecalciferol (VITAMIN D3) 50 MCG (2000 UT) capsule Take 1 capsule (2,000 Units total) by mouth daily. (Patient not taking: Reported on 05/29/2024)   Cyanocobalamin (B-12 COMPLIANCE INJECTION) 1000 MCG/ML KIT Inject as directed.   cyclobenzaprine  (FLEXERIL ) 10 MG tablet Take 1 tablet (10 mg total) by mouth 2 (two) times daily as needed for muscle spasms.   ergocalciferol  (VITAMIN D2) 1.25 MG (50000 UT) capsule Take 1 capsule (50,000 Units total) by mouth once a week.   losartan (COZAAR) 50 MG tablet Take 50 mg by mouth daily.   Multiple Vitamin (MULTIVITAMIN ADULT PO) Take by mouth.   nebivolol  (BYSTOLIC ) 2.5 MG tablet Take 1 tablet (2.5 mg total) by mouth daily.   traMADol HCl 100 MG TABS Take by mouth.   [DISCONTINUED] Semaglutide -Weight Management (WEGOVY ) 0.5 MG/0.5ML SOAJ Inject 0.5 mg into the skin once a week.   No facility-administered encounter medications on file as of 05/29/2024.     Follow-Up   Return in about 4 weeks (around 06/26/2024) for For Weight Mangement with Dr. Francyne.SABRA She was informed of the importance of frequent follow up visits to maximize her success with intensive lifestyle  modifications for her multiple health conditions.  Attestation Statement   Reviewed by clinician on day of visit: allergies, medications, problem list, medical history, surgical history, family history, social history, and previous encounter notes.     Lucas Francyne, MD

## 2024-06-11 ENCOUNTER — Emergency Department (HOSPITAL_BASED_OUTPATIENT_CLINIC_OR_DEPARTMENT_OTHER): Admitting: Radiology

## 2024-06-11 ENCOUNTER — Other Ambulatory Visit: Payer: Self-pay

## 2024-06-11 ENCOUNTER — Emergency Department (HOSPITAL_BASED_OUTPATIENT_CLINIC_OR_DEPARTMENT_OTHER)
Admission: EM | Admit: 2024-06-11 | Discharge: 2024-06-11 | Disposition: A | Discharge status: Home / Self Care | Attending: Emergency Medicine | Admitting: Emergency Medicine

## 2024-06-11 ENCOUNTER — Encounter (HOSPITAL_BASED_OUTPATIENT_CLINIC_OR_DEPARTMENT_OTHER): Payer: Self-pay | Admitting: Emergency Medicine

## 2024-06-11 DIAGNOSIS — W19XXXA Unspecified fall, initial encounter: Secondary | ICD-10-CM | POA: Insufficient documentation

## 2024-06-11 DIAGNOSIS — M79672 Pain in left foot: Secondary | ICD-10-CM | POA: Insufficient documentation

## 2024-06-11 DIAGNOSIS — H1132 Conjunctival hemorrhage, left eye: Secondary | ICD-10-CM | POA: Insufficient documentation

## 2024-06-11 NOTE — ED Provider Notes (Signed)
 [facility] AT [facility] Provider Note   CSN: [id number] Arrival date & time: 06/11/24  9379     Patient presents with: Foot Pain and Eye Problem   Ashlee Gordon is a 53 y.o. female.   The history is provided by the patient and medical records. No language interpreter was used.  Foot Pain This is a new problem. The current episode started yesterday. The problem occurs constantly. The problem has not changed since onset.Pertinent negatives include no chest pain, no abdominal pain, no headaches and no shortness of breath. The symptoms are aggravated by walking. Nothing relieves the symptoms. She has tried nothing for the symptoms.  Eye Problem Location:  Left eye Quality: redness medially. Severity:  Moderate Associated symptoms: redness   Associated symptoms: no discharge, no headaches, no itching, no nausea and no vomiting        Prior to Admission medications   Medication Sig Start Date End Date Taking? Authorizing Provider  buPROPion  (WELLBUTRIN  XL) 150 MG 24 hr tablet Take 1 tablet (150 mg total) by mouth daily. 04/30/24   Francyne Romano, MD  CAPSAICIN HEAT PATCH EX Apply topically.    [provider]  Cholecalciferol (VITAMIN D3) 50 MCG (2000 UT) capsule Take 1 capsule (2,000 Units total) by mouth daily. Patient not taking: Reported on 05/29/2024 10/24/23   Francyne Romano, MD  Cyanocobalamin (B-12 COMPLIANCE INJECTION) 1000 MCG/ML KIT Inject as directed.    [provider]  cyclobenzaprine  (FLEXERIL ) 10 MG tablet Take 1 tablet (10 mg total) by mouth 2 (two) times daily as needed for muscle spasms. 02/26/24   Small, Brooke L, PA  ergocalciferol  (VITAMIN D2) 1.25 MG (50000 UT) capsule Take 1 capsule (50,000 Units total) by mouth once a week. 07/24/23   Francyne Romano, MD  losartan (COZAAR) 50 MG tablet Take 50 mg by mouth daily.    [provider]  Multiple Vitamin (MULTIVITAMIN ADULT PO) Take by mouth.    [provider]  nebivolol  (BYSTOLIC ) 2.5 MG tablet Take 1 tablet (2.5 mg total) by mouth daily. 03/21/24   Francyne Romano, MD  ondansetron  (ZOFRAN -ODT) 4 MG disintegrating tablet Dissolve 1 tablet (4 mg total) by mouth every 8 (eight) hours as needed for nausea or vomiting. 05/29/24   Francyne Romano, MD  semaglutide -weight management (WEGOVY ) 1 MG/0.5ML SOAJ SQ injection Inject 1 mg into the skin once a week. 05/29/24   Francyne Romano, MD  traMADol HCl 100 MG TABS Take by mouth. 04/14/22   [provider]    Allergies: Metformin  and related    Review of Systems  Constitutional:  Negative for chills, fatigue and fever.  HENT:  Negative for congestion.   Eyes:  Positive for redness. Negative for pain, discharge, itching and visual disturbance.  Respiratory:  Negative for cough, chest tightness and shortness of breath.   Cardiovascular:  Negative for chest pain and palpitations.  Gastrointestinal:  Negative for abdominal pain, constipation, diarrhea, nausea and vomiting.  Genitourinary:  Negative for dysuria, flank pain and frequency.  Musculoskeletal:  Negative for back pain, neck pain and neck stiffness.  Skin:  Negative for rash and wound.  Neurological:  Negative for headaches.  Psychiatric/Behavioral:  Negative for agitation.   All other systems reviewed and are negative.   Updated Vital Signs BP 134/84   Pulse 69   Temp 98 F (36.7 C) (Oral)   Resp 16   Ht 5' 8 (1.727 m)   Wt (!) 192.3 kg   LMP 10/22/2020  SpO2 100%   BMI 64.47 kg/m   Physical Exam Vitals and nursing note reviewed.  Constitutional:      General: She is not in acute distress.    Appearance: She is well-developed.  HENT:     Head: Atraumatic.     Mouth/Throat:     Pharynx: No oropharyngeal exudate or posterior oropharyngeal erythema.  Eyes:     General: Lids are normal.     Extraocular Movements: Extraocular movements intact.     Conjunctiva/sclera:     Left eye: Left conjunctiva is  not injected. Hemorrhage present.     Pupils: Pupils are equal, round, and reactive to light.      Comments: Subconjunctival hemorrhage in medial left eye.  No eye tenderness, pupil otherwise, normal extract movements.  Cardiovascular:     Rate and Rhythm: Normal rate and regular rhythm.     Heart sounds: No murmur heard. Pulmonary:     Effort: Pulmonary effort is normal. No respiratory distress.     Breath sounds: Normal breath sounds.  Abdominal:     Palpations: Abdomen is soft.     Tenderness: There is no abdominal tenderness.  Musculoskeletal:        General: Swelling, tenderness and signs of injury present.     Cervical back: Neck supple.     Left foot: Swelling, tenderness and bony tenderness present.       Feet:     Comments: Tenderness and swelling to the dorsum of the left foot.  Intact sensation strength and pulse.  No ankle tenderness or knee tenderness.  No laceration.  No crepitance or fluctuance.  Skin:    General: Skin is warm and dry.     Capillary Refill: Capillary refill takes less than 2 seconds.     Findings: No erythema or rash.  Neurological:     Mental Status: She is alert.  Psychiatric:        Mood and Affect: Mood normal.     (all labs ordered are listed, but only abnormal results are displayed) Labs Reviewed - No data to display  EKG: None  Radiology: DG Foot 2 Views Left Result Date: 06/11/2024 CLINICAL DATA:  Clemens last night with left foot and ankle injury. EXAM: LEFT FOOT - 2 VIEW COMPARISON:  Left foot AP Lat exam 06/12/2023. FINDINGS: AP Lat two views. There is mild generalized edema in the midfoot and forefoot, more moderate edema or soft tissue prominence at the ankle and hindfoot. There are artifacts from overlying clothing. Mineralization is mildly osteopenic. No fracture seen through the overlying artifacts. No malalignment. There is midfoot arthrosis and small plantar and dorsal calcaneal spurs. IMPRESSION: 1. No evidence of fracture or  malalignment. 2. Soft tissue edema. 3. Osteopenia and degenerative change. Electronically Signed   By: Francis Quam M.D.   On: 06/11/2024 07:11     Procedures   Medications Ordered in the ED - No data to display                                  Medical Decision Making Amount and/or Complexity of Data Reviewed Radiology: ordered.    Ashlee Gordon is a 53 y.o. female with a past medical history significant for hypertension, obesity, fibromyalgia, sleep apnea who presents with fall last night and left foot pain and eye change.  According to patient, she got tripped up last night going to bed and fell and hurt  her left foot.  She has never injured this left foot before and is never her ankle.  She did not hit her head and denies any other complaints in the extremities.  She denies any preceding chest pain shortness with or palpitations otherwise denies nausea, vomiting, constipation, diarrhea, or urinary changes.  Denies headache or neck pain.  Did not hit her head or her eyes.  She noticed this morning in the mirror she has redness in the medial aspect of her left eye.  It is not painful and she has no vision changes.  On exam, lungs clear.  Chest nontender.  Abdomen nontender.  Patient has evidence of a left medial subconjunctival hemorrhage but has normal extract movements and no tenderness around the eye.  No neck tenderness or evidence of head trauma.  Pupils had no abnormalities otherwise.  Hips nontender knees nontender.  Ankle nontender.  Patient has tenderness and swelling to the dorsum of the left foot but has movement in her toes with intact sensation and cap refill.  Patient had x-ray in triage of the foot that does not show clear evidence of acute fracture however clinically I am concerned for either occult fracture or nonbony injury.  We discussed the possibly of occult fracture so we will treat her with a postop shoe and crutches and instructed to follow-up with orthopedics or PCP  if symptoms persist.  Patient says she has pain medicine at home and does not need any.  For the cups conjunctival hemorrhage we discussed that given her lack of other eye symptoms we have low suspicion for concerning injury and agree that this will likely reabsorb on its own.  If it was to worsen she understands return precautions well.  Will discharge after crutches and postop shoe are given and patient will follow-up with outpatient team.  She no other questions or concerns and patient was discharged in good condition.      Final diagnoses:  Fall, initial encounter  Left foot pain  Subconjunctival hemorrhage of left eye    ED Discharge Orders     None      Clinical Impression: 1. Fall, initial encounter   2. Left foot pain   3. Subconjunctival hemorrhage of left eye     Disposition: Discharge  Condition: Good  I have discussed the results, Dx and Tx plan with the pt(& family if present). He/she/they expressed understanding and agree(s) with the plan. Discharge instructions discussed at great length. Strict return precautions discussed and pt &/or family have verbalized understanding of the instructions. No further questions at time of discharge.    New Prescriptions   No medications on file    Follow Up: Center, [facility] [address] [place] [address] [phone]     [place] [address] [place] [address] [phone]         Christeen Lai, Lonni PARAS, MD 06/11/24 [phone]

## 2024-06-11 NOTE — Discharge Instructions (Signed)
 Your history, exam, and evaluation today did not reveal evidence of clear bony injury or fracture and your eye exam seem consistent with a subconjunctival hemorrhage.  We feel you are safe for discharge home given the negative imaging however as we discussed there could be a hidden injury or nonbony injury that would need further workup and management.  If symptoms not improved, recommend follow-up with orthopedics or PCP as you may need more advanced imaging or repeat imaging.  Your subconjunctival hemorrhage will absorb on its own and your exam was otherwise reassuring.  We feel you are safe for discharge home.  If any symptoms change or worsen acutely, please return to the nearest emergency department.

## 2024-06-11 NOTE — ED Triage Notes (Signed)
  Patient comes in with L foot/ankle pain from a fall that occurred last night.  Patient states she was walking through the house going to bed and rolled her foot causing her to fall.  Patient states she did not hit her head.  No LOC.  Endorses pain to top of L foot.  Patient also noticed a subconjunctival hemorrhage in her L eye this morning.  Denies any pain or alteration of vision.  Pain 7/10, stabbing.   Applied icyhot to foot and thought rest would help but woke up with worsening pain.

## 2024-06-11 NOTE — ED Notes (Signed)
 PT rolled to the lobby in wheelchair. Asked this tech if they could have a walker once in the lobby. RN currently in triage, will inform once available.

## 2024-06-11 NOTE — ED Notes (Signed)
 No walker available for patient weight limit. This RN explained to patient that no walker was available and encouraged patient to use crutches. Patient verbalized understanding.

## 2024-07-08 ENCOUNTER — Ambulatory Visit (INDEPENDENT_AMBULATORY_CARE_PROVIDER_SITE_OTHER): Admitting: Internal Medicine

## 2024-07-29 DIAGNOSIS — M25561 Pain in right knee: Secondary | ICD-10-CM | POA: Insufficient documentation

## 2024-07-31 ENCOUNTER — Other Ambulatory Visit (HOSPITAL_COMMUNITY): Payer: Self-pay | Admitting: Physician Assistant

## 2024-07-31 ENCOUNTER — Ambulatory Visit (HOSPITAL_COMMUNITY)
Admission: RE | Admit: 2024-07-31 | Discharge: 2024-07-31 | Disposition: A | Discharge status: Home / Self Care | Source: Ambulatory Visit | Attending: Vascular Surgery | Admitting: Vascular Surgery

## 2024-07-31 DIAGNOSIS — M79604 Pain in right leg: Secondary | ICD-10-CM | POA: Diagnosis not present

## 2024-07-31 DIAGNOSIS — M7989 Other specified soft tissue disorders: Secondary | ICD-10-CM | POA: Insufficient documentation

## 2024-07-31 LAB — COMPREHENSIVE METABOLIC PANEL WITH GFR
Globulin: 3.1
eGFR: 76

## 2024-07-31 LAB — CBC AND DIFFERENTIAL
HCT: 41 (ref 36–46)
Hemoglobin: 12.8 (ref 12.0–16.0)
Platelets: 243 K/uL (ref 150–400)
WBC: 8.4

## 2024-07-31 LAB — HEPATIC FUNCTION PANEL
ALT: 16 U/L (ref 7–35)
AST: 18 (ref 13–35)
Alkaline Phosphatase: 68 (ref 25–125)

## 2024-07-31 LAB — BASIC METABOLIC PANEL WITH GFR
BUN: 15 (ref 4–21)
CO2: 28 — AB (ref 13–22)
Chloride: 103 (ref 99–108)
Creatinine: 0.9 (ref 0.5–1.1)
Glucose: 138
Potassium: 3.8 meq/L (ref 3.5–5.1)
Sodium: 146 (ref 137–147)

## 2024-07-31 LAB — CBC: RBC: 4.62 (ref 3.87–5.11)

## 2024-08-05 ENCOUNTER — Ambulatory Visit (INDEPENDENT_AMBULATORY_CARE_PROVIDER_SITE_OTHER): Admitting: Internal Medicine

## 2024-08-05 ENCOUNTER — Encounter (INDEPENDENT_AMBULATORY_CARE_PROVIDER_SITE_OTHER): Payer: Self-pay | Admitting: Internal Medicine

## 2024-08-05 VITALS — BP 142/96 | HR 91 | Temp 98.0°F | Ht 68.0 in | Wt >= 6400 oz

## 2024-08-05 DIAGNOSIS — R7303 Prediabetes: Secondary | ICD-10-CM | POA: Diagnosis not present

## 2024-08-05 DIAGNOSIS — G4733 Obstructive sleep apnea (adult) (pediatric): Secondary | ICD-10-CM

## 2024-08-05 DIAGNOSIS — Z6841 Body Mass Index (BMI) 40.0 and over, adult: Secondary | ICD-10-CM

## 2024-08-05 DIAGNOSIS — I10 Essential (primary) hypertension: Secondary | ICD-10-CM

## 2024-08-05 DIAGNOSIS — E66813 Obesity, class 3: Secondary | ICD-10-CM

## 2024-08-05 NOTE — Assessment & Plan Note (Signed)
 Recent weight gain of 17 pounds attributed to lack of physical activity due to a foot injury and stress from family health issues. Untreated sleep apnea may impact hunger hormones and metabolic rate. A comprehensive approach including diet, physical activity, behavioral modifications, and medications is necessary.  - Provide a copy of the 1800 calorie diet plan - Encourage meal replacements and prepackaged food services - Advise on meal prepping and finding a sustainable meal plan - Encourage physical activity aiming for 240 minutes per week

## 2024-08-05 NOTE — Assessment & Plan Note (Signed)
 She will try to obtain sleep study results from 2022.  We have counseled her on the bidirectional relationship between sleep apnea and weight.

## 2024-08-05 NOTE — Assessment & Plan Note (Signed)
 Reviewed labs from [facility] her A1c is 6.0 relatively unchanged from baseline.  She is intolerant to metformin  and no longer has coverage for Wegovy .  She will continue with implementation of lifestyle changes.

## 2024-08-05 NOTE — Assessment & Plan Note (Signed)
 She has had problems with adherence in the past with her medications.  I reviewed recent labs done at [facility] which showed normal renal parameters.  She will continue on Bystolic , losartan she may also begin amlodipine.

## 2024-08-05 NOTE — Progress Notes (Signed)
 Office: [phone]  /  Fax: [phone]  Weight Summary and Body Composition Analysis (BIA)  Vitals Temp: 98 F (36.7 C) BP: (!) 142/96 Pulse Rate: 91 SpO2: 98 %   Anthropometric Measurements Height: 5' 8 (1.727 m) Weight: (!) 441 lb (200 kg) BMI (Calculated): 67.07 Weight at Last Visit: 424 lb Weight Lost Since Last Visit: 0 lb Weight Gained Since Last Visit: 17 lb Starting Weight: 421 lb Total Weight Loss (lbs): 0 lb (0 kg) Peak Weight: 546 lb   Body Composition  Body Fat %: 64.1 % Fat Mass (lbs): 282.6 lbs Muscle Mass (lbs): 150.4 lbs Visceral Fat Rating : 31    RMR: 3096  Today's Visit #: no  Starting Date: 11/15/22   Subjective   Chief Complaint: Obesity  Interval History Discussed the use of AI scribe software for clinical note transcription with the patient, who gave verbal consent to proceed.  History of Present Illness Ashlee Gordon is a 53 year old female presenting for medical weight management.  She has not been following her plan nor exercising.  She reports gaining 17 pounds over the past month. She states that she has not been physically active due to a foot injury and has been experiencing increased stress related to her brother's stroke and her sister's breast cancer. Her foot injury has prevented her from exercising, and she is experiencing high stress levels.   She has a history of prediabetes with an A1c of 6.0, slightly improved from 6.1. She does not currently take metformin  due to adverse effects, including nausea.  She has a history of sleep apnea diagnosed in 2022, for which she was prescribed a CPAP machine. However, she discontinued its use due to discomfort and difficulty breathing with the device. She does not recall the severity of her sleep apnea but remembers it was significant enough to warrant CPAP therapy.  Her family history includes her brother's recent stroke and her sister's breast cancer, which have contributed to  her current stress levels. She has been involved in caregiving for her family, impacting her ability to focus on her own health.  She describes her eating habits as inconsistent, often skipping meals and preparing dinner as her main meal of the day. She acknowledges a pattern of 'all or none' behavior, where she tends to drop everything to assist others, impacting her weight management efforts.     Challenges affecting patient progress: difficulty maintaining a reduced calorie state, low volume of physical activity at present , orthopedic problems, medical conditions or chronic pain affecting mobility, and all-or- none mindset.  Untreated sleep apnea   Pharmacotherapy for weight management: She is currently taking Bupropion  (single agent, off label use) with adequate clinical response  and without side effects..  No longer has coverage for Wegovy   Assessment and Plan   Treatment Plan For Obesity:  Recommended Dietary Goals  Ashlee Gordon is currently in the action stage of change. As such, her goal is to continue weight management plan. She has agreed to: follow the Category 4 plan - 1800 kcal per day  Behavioral Health and Counseling  We discussed the following behavioral modification strategies today: work on managing stress, creating time for self-care and relaxation, continue to work on implementation of reduced calorie nutritional plan, getting back on track after recent relapse, avoid all or none thinking, display self-compassion, and rethink your why .  Additional education and resources provided today: None  Recommended Physical Activity Goals  Ashlee Gordon has been advised to work up to  150 minutes of moderate intensity aerobic activity a week and strengthening exercises 2-3 times per week for cardiovascular health, weight loss maintenance and preservation of muscle mass.  She has agreed to :  Think about enjoyable ways to increase daily physical activity and overcoming barriers to  exercise, Increase physical activity in their day and reduce sedentary time (increase NEAT)., Increase volume of physical activity to a goal of 240 minutes a week, and Combine aerobic and strengthening exercises for efficiency and improved cardiometabolic health.  Medical Interventions and Pharmacotherapy  We discussed various medication options to help Ashlee Gordon with her weight loss efforts and we both agreed to : Continue Wellbutrin  for now no longer has coverage for Wegovy   Associated Conditions Impacted by Obesity Treatment  Assessment & Plan Primary hypertension She has had problems with adherence in the past with her medications.  I reviewed recent labs done at [facility] which showed normal renal parameters.  She will continue on Bystolic , losartan she may also begin amlodipine. Prediabetes Reviewed labs from [facility] her A1c is 6.0 relatively unchanged from baseline.  She is intolerant to metformin  and no longer has coverage for Wegovy .  She will continue with implementation of lifestyle changes. Class 3 severe obesity with serious comorbidity and body mass index (BMI) of 60.0 to 69.9 in adult, unspecified obesity type (HCC) Recent weight gain of 17 pounds attributed to lack of physical activity due to a foot injury and stress from family health issues. Untreated sleep apnea may impact hunger hormones and metabolic rate. A comprehensive approach including diet, physical activity, behavioral modifications, and medications is necessary.  - Provide a copy of the 1800 calorie diet plan - Encourage meal replacements and prepackaged food services - Advise on meal prepping and finding a sustainable meal plan - Encourage physical activity aiming for 240 minutes per week  OSA (obstructive sleep apnea) She will try to obtain sleep study results from 2022.  We have counseled her on the bidirectional relationship between sleep apnea and weight.      Objective   Physical Exam:  Blood  pressure (!) 142/96, pulse 91, temperature 98 F (36.7 C), height 5' 8 (1.727 m), weight (!) 441 lb (200 kg), last menstrual period 10/22/2020, SpO2 98%. Body mass index is 67.05 kg/m.  General: She is overweight, cooperative, alert, well developed, and in no acute distress. PSYCH: Has normal mood, affect and thought process.   HEENT: EOMI, sclerae are anicteric. Lungs: Normal breathing effort, no conversational dyspnea. Extremities: No edema.  Neurologic: No gross sensory or motor deficits. No tremors or fasciculations noted.    Diagnostic Data Reviewed:  BMET    Component Value Date/Time   NA 141 11/22/2022 1016   K 4.1 11/22/2022 1016   CL 101 11/22/2022 1016   CO2 23 11/22/2022 1016   GLUCOSE 94 11/22/2022 1016   GLUCOSE 110 (H) 08/08/2022 1805   BUN 15 11/22/2022 1016   CREATININE 0.92 11/22/2022 1016   CALCIUM 9.0 11/22/2022 1016   GFRNONAA 36 (L) 08/08/2022 1805   Lab Results  Component Value Date   HGBA1C 6.1 (H) 07/24/2023   HGBA1C 6.1 (H) 11/22/2022   Lab Results  Component Value Date   INSULIN  37.9 (H) 11/22/2022   Lab Results  Component Value Date   TSH 1.220 11/22/2022   CBC    Component Value Date/Time   WBC 13.9 (H) 11/22/2022 1016   WBC 9.1 08/08/2022 1805   RBC 4.78 11/22/2022 1016   RBC 4.47 08/08/2022 1805  HGB 12.8 11/22/2022 1016   HCT 41.0 11/22/2022 1016   PLT 183 08/08/2022 1805   MCV 86 11/22/2022 1016   MCH 26.8 11/22/2022 1016   MCH 27.5 08/08/2022 1805   MCHC 31.2 (L) 11/22/2022 1016   MCHC 32.1 08/08/2022 1805   RDW 13.8 11/22/2022 1016   Iron Studies No results found for: IRON, TIBC, FERRITIN, IRONPCTSAT Lipid Panel     Component Value Date/Time   CHOL 185 11/22/2022 1016   TRIG 74 11/22/2022 1016   HDL 54 11/22/2022 1016   LDLCALC 117 (H) 11/22/2022 1016   Hepatic Function Panel     Component Value Date/Time   PROT 7.5 11/22/2022 1016   ALBUMIN 4.0 11/22/2022 1016   AST 16 11/22/2022 1016   ALT 14  11/22/2022 1016   ALKPHOS 86 11/22/2022 1016   BILITOT 0.2 11/22/2022 1016      Component Value Date/Time   TSH 1.220 11/22/2022 1016   Nutritional Lab Results  Component Value Date   VD25OH 27.8 (L) 07/24/2023   VD25OH 13.9 (L) 11/22/2022    Medications: Outpatient Encounter Medications as of 08/05/2024  Medication Sig   buPROPion  (WELLBUTRIN  XL) 150 MG 24 hr tablet Take 1 tablet (150 mg total) by mouth daily.   CAPSAICIN HEAT PATCH EX Apply topically.   Cyanocobalamin (B-12 COMPLIANCE INJECTION) 1000 MCG/ML KIT Inject as directed.   cyclobenzaprine  (FLEXERIL ) 10 MG tablet Take 1 tablet (10 mg total) by mouth 2 (two) times daily as needed for muscle spasms.   ergocalciferol  (VITAMIN D2) 1.25 MG (50000 UT) capsule Take 1 capsule (50,000 Units total) by mouth once a week.   losartan (COZAAR) 50 MG tablet Take 50 mg by mouth daily.   Multiple Vitamin (MULTIVITAMIN ADULT PO) Take by mouth.   nebivolol  (BYSTOLIC ) 2.5 MG tablet Take 1 tablet (2.5 mg total) by mouth daily.   [DISCONTINUED] ondansetron  (ZOFRAN -ODT) 4 MG disintegrating tablet Dissolve 1 tablet (4 mg total) by mouth every 8 (eight) hours as needed for nausea or vomiting.   [DISCONTINUED] semaglutide -weight management (WEGOVY ) 1 MG/0.5ML SOAJ SQ injection Inject 1 mg into the skin once a week.   [DISCONTINUED] traMADol HCl 100 MG TABS Take by mouth.   Cholecalciferol (VITAMIN D3) 50 MCG (2000 UT) capsule Take 1 capsule (2,000 Units total) by mouth daily. (Patient not taking: Reported on 08/05/2024)   No facility-administered encounter medications on file as of 08/05/2024.     Follow-Up   Return in about 4 weeks (around 09/02/2024) for For Weight Mangement with Dr. Francyne.SABRA She was informed of the importance of frequent follow up visits to maximize her success with intensive lifestyle modifications for her multiple health conditions.  Attestation Statement   Reviewed by clinician on day of visit: allergies,  medications, problem list, medical history, surgical history, family history, social history, and previous encounter notes.     Lucas Francyne, MD

## 2024-08-08 ENCOUNTER — Encounter (INDEPENDENT_AMBULATORY_CARE_PROVIDER_SITE_OTHER): Payer: Self-pay | Admitting: Dermatology

## 2024-09-27 ENCOUNTER — Other Ambulatory Visit: Payer: Self-pay

## 2024-09-27 ENCOUNTER — Emergency Department (HOSPITAL_BASED_OUTPATIENT_CLINIC_OR_DEPARTMENT_OTHER)

## 2024-09-27 ENCOUNTER — Inpatient Hospital Stay (HOSPITAL_COMMUNITY): Admitting: Anesthesiology

## 2024-09-27 ENCOUNTER — Encounter (HOSPITAL_COMMUNITY): Admission: EM | Payer: Self-pay | Source: Home / Self Care | Attending: Internal Medicine

## 2024-09-27 ENCOUNTER — Inpatient Hospital Stay (HOSPITAL_COMMUNITY)

## 2024-09-27 ENCOUNTER — Inpatient Hospital Stay (HOSPITAL_BASED_OUTPATIENT_CLINIC_OR_DEPARTMENT_OTHER)
Admission: EM | Admit: 2024-09-27 | Discharge: 2024-10-12 | DRG: 020 | Disposition: A | Attending: Family Medicine | Admitting: Family Medicine

## 2024-09-27 ENCOUNTER — Inpatient Hospital Stay (HOSPITAL_COMMUNITY): Admit: 2024-09-27 | Discharge: 2024-09-27 | Disposition: A | Attending: Pulmonary Disease

## 2024-09-27 ENCOUNTER — Encounter (HOSPITAL_BASED_OUTPATIENT_CLINIC_OR_DEPARTMENT_OTHER): Payer: Self-pay | Admitting: Emergency Medicine

## 2024-09-27 DIAGNOSIS — K8000 Calculus of gallbladder with acute cholecystitis without obstruction: Secondary | ICD-10-CM | POA: Diagnosis present

## 2024-09-27 DIAGNOSIS — E8721 Acute metabolic acidosis: Secondary | ICD-10-CM | POA: Diagnosis not present

## 2024-09-27 DIAGNOSIS — I10 Essential (primary) hypertension: Secondary | ICD-10-CM | POA: Diagnosis present

## 2024-09-27 DIAGNOSIS — Z6841 Body Mass Index (BMI) 40.0 and over, adult: Secondary | ICD-10-CM | POA: Diagnosis not present

## 2024-09-27 DIAGNOSIS — M5430 Sciatica, unspecified side: Secondary | ICD-10-CM | POA: Diagnosis present

## 2024-09-27 DIAGNOSIS — Z8249 Family history of ischemic heart disease and other diseases of the circulatory system: Secondary | ICD-10-CM

## 2024-09-27 DIAGNOSIS — G4453 Primary thunderclap headache: Secondary | ICD-10-CM | POA: Diagnosis present

## 2024-09-27 DIAGNOSIS — J9690 Respiratory failure, unspecified, unspecified whether with hypoxia or hypercapnia: Secondary | ICD-10-CM | POA: Diagnosis present

## 2024-09-27 DIAGNOSIS — G4733 Obstructive sleep apnea (adult) (pediatric): Secondary | ICD-10-CM

## 2024-09-27 DIAGNOSIS — E871 Hypo-osmolality and hyponatremia: Secondary | ICD-10-CM | POA: Diagnosis present

## 2024-09-27 DIAGNOSIS — I671 Cerebral aneurysm, nonruptured: Secondary | ICD-10-CM | POA: Diagnosis not present

## 2024-09-27 DIAGNOSIS — I720 Aneurysm of carotid artery: Secondary | ICD-10-CM | POA: Diagnosis not present

## 2024-09-27 DIAGNOSIS — F17290 Nicotine dependence, other tobacco product, uncomplicated: Secondary | ICD-10-CM | POA: Diagnosis present

## 2024-09-27 DIAGNOSIS — Z833 Family history of diabetes mellitus: Secondary | ICD-10-CM

## 2024-09-27 DIAGNOSIS — E876 Hypokalemia: Secondary | ICD-10-CM | POA: Diagnosis present

## 2024-09-27 DIAGNOSIS — R3589 Other polyuria: Secondary | ICD-10-CM | POA: Diagnosis not present

## 2024-09-27 DIAGNOSIS — R7303 Prediabetes: Secondary | ICD-10-CM | POA: Diagnosis not present

## 2024-09-27 DIAGNOSIS — D649 Anemia, unspecified: Secondary | ICD-10-CM | POA: Diagnosis present

## 2024-09-27 DIAGNOSIS — F32A Depression, unspecified: Secondary | ICD-10-CM | POA: Diagnosis present

## 2024-09-27 DIAGNOSIS — E119 Type 2 diabetes mellitus without complications: Secondary | ICD-10-CM | POA: Diagnosis present

## 2024-09-27 DIAGNOSIS — Z91199 Patient's noncompliance with other medical treatment and regimen due to unspecified reason: Secondary | ICD-10-CM

## 2024-09-27 DIAGNOSIS — E66813 Obesity, class 3: Secondary | ICD-10-CM

## 2024-09-27 DIAGNOSIS — M797 Fibromyalgia: Secondary | ICD-10-CM | POA: Diagnosis present

## 2024-09-27 DIAGNOSIS — I609 Nontraumatic subarachnoid hemorrhage, unspecified: Principal | ICD-10-CM

## 2024-09-27 DIAGNOSIS — I6002 Nontraumatic subarachnoid hemorrhage from left carotid siphon and bifurcation: Secondary | ICD-10-CM | POA: Diagnosis present

## 2024-09-27 DIAGNOSIS — Z9911 Dependence on respirator [ventilator] status: Secondary | ICD-10-CM | POA: Diagnosis not present

## 2024-09-27 DIAGNOSIS — G8929 Other chronic pain: Secondary | ICD-10-CM | POA: Diagnosis present

## 2024-09-27 DIAGNOSIS — R519 Headache, unspecified: Secondary | ICD-10-CM | POA: Diagnosis not present

## 2024-09-27 DIAGNOSIS — Z8419 Family history of other disorders of kidney and ureter: Secondary | ICD-10-CM

## 2024-09-27 DIAGNOSIS — R55 Syncope and collapse: Secondary | ICD-10-CM | POA: Diagnosis not present

## 2024-09-27 DIAGNOSIS — I6522 Occlusion and stenosis of left carotid artery: Secondary | ICD-10-CM | POA: Diagnosis not present

## 2024-09-27 HISTORY — PX: RADIOLOGY WITH ANESTHESIA: SHX6223

## 2024-09-27 HISTORY — PX: IR TRANSCATH/EMBOLIZ: IMG695

## 2024-09-27 HISTORY — PX: IR ANGIO INTRA EXTRACRAN SEL INTERNAL CAROTID UNI L MOD SED: IMG5361

## 2024-09-27 HISTORY — PX: IR US GUIDE VASC ACCESS RIGHT: IMG2390

## 2024-09-27 HISTORY — PX: IR ANGIOGRAM FOLLOW UP STUDY: IMG697

## 2024-09-27 HISTORY — DX: Type 2 diabetes mellitus without complications: E11.9

## 2024-09-27 HISTORY — DX: Obstructive sleep apnea (adult) (pediatric): G47.33

## 2024-09-27 HISTORY — DX: Morbid (severe) obesity due to excess calories: E66.01

## 2024-09-27 LAB — CBC WITH DIFFERENTIAL/PLATELET
Abs Immature Granulocytes: 0.05 K/uL (ref 0.00–0.07)
Basophils Absolute: 0.1 K/uL (ref 0.0–0.1)
Basophils Relative: 1 %
Eosinophils Absolute: 0.2 K/uL (ref 0.0–0.5)
Eosinophils Relative: 2 %
HCT: 42.8 % (ref 36.0–46.0)
Hemoglobin: 13.7 g/dL (ref 12.0–15.0)
Immature Granulocytes: 1 %
Lymphocytes Relative: 30 %
Lymphs Abs: 3.1 K/uL (ref 0.7–4.0)
MCH: 27.3 pg (ref 26.0–34.0)
MCHC: 32 g/dL (ref 30.0–36.0)
MCV: 85.4 fL (ref 80.0–100.0)
Monocytes Absolute: 0.5 K/uL (ref 0.1–1.0)
Monocytes Relative: 5 %
Neutro Abs: 6.6 K/uL (ref 1.7–7.7)
Neutrophils Relative %: 61 %
Platelets: 266 K/uL (ref 150–400)
RBC: 5.01 MIL/uL (ref 3.87–5.11)
RDW: 14.1 % (ref 11.5–15.5)
WBC: 10.5 K/uL (ref 4.0–10.5)
nRBC: 0 % (ref 0.0–0.2)

## 2024-09-27 LAB — COMPREHENSIVE METABOLIC PANEL WITH GFR
ALT: 29 U/L (ref 0–44)
AST: 25 U/L (ref 15–41)
Albumin: 4.3 g/dL (ref 3.5–5.0)
Alkaline Phosphatase: 92 U/L (ref 38–126)
Anion gap: 11 (ref 5–15)
BUN: 16 mg/dL (ref 6–20)
CO2: 28 mmol/L (ref 22–32)
Calcium: 9.7 mg/dL (ref 8.9–10.3)
Chloride: 104 mmol/L (ref 98–111)
Creatinine, Ser: 1.01 mg/dL — ABNORMAL HIGH (ref 0.44–1.00)
GFR, Estimated: >60 mL/min (ref >60)
Glucose, Bld: 161 mg/dL — ABNORMAL HIGH (ref 70–99)
Potassium: 3.7 mmol/L (ref 3.5–5.1)
Sodium: 143 mmol/L (ref 135–145)
Total Bilirubin: 0.4 mg/dL (ref 0.0–1.2)
Total Protein: 8.2 g/dL — ABNORMAL HIGH (ref 6.5–8.1)

## 2024-09-27 LAB — PROTIME-INR
INR: 0.9 (ref 0.8–1.2)
Prothrombin Time: 12.8 s (ref 11.4–15.2)

## 2024-09-27 LAB — ECHOCARDIOGRAM COMPLETE
AV Mean grad: 13 mmHg
AV Peak grad: 23.4 mmHg
Ao pk vel: 2.42 m/s
Area-P 1/2: 3.79 cm2
Calc EF: 76.2 %
Est EF: >75
S' Lateral: 2.6 cm
Single Plane A2C EF: 62.9 %
Single Plane A4C EF: 82.6 %

## 2024-09-27 LAB — MRSA NEXT GEN BY PCR, NASAL: MRSA by PCR Next Gen: NOT DETECTED

## 2024-09-27 LAB — GLUCOSE, CAPILLARY
Glucose-Capillary: 168 mg/dL — ABNORMAL HIGH (ref 70–99)
Glucose-Capillary: 156 mg/dL — ABNORMAL HIGH (ref 70–99)

## 2024-09-27 LAB — ETHANOL: Alcohol, Ethyl (B): <15 mg/dL (ref <15)

## 2024-09-27 SURGERY — RADIOLOGY WITH ANESTHESIA
Anesthesia: General

## 2024-09-27 MED ORDER — EPTIFIBATIDE 20 MG/10ML IV SOLN
INTRAVENOUS | Status: AC | PRN
  Administered 2024-09-27: 6 mg via INTRA_ARTERIAL

## 2024-09-27 MED ORDER — FENTANYL 2500MCG IN NS 250ML (10MCG/ML) PREMIX INFUSION
0.0000 ug/h | INTRAVENOUS | Status: DC
  Administered 2024-09-27: 50 ug/h via INTRAVENOUS
  Filled 2024-09-27: qty 250

## 2024-09-27 MED ORDER — ASPIRIN 325 MG PO TABS
ORAL_TABLET | ORAL | Status: AC
  Filled 2024-09-27: qty 1

## 2024-09-27 MED ORDER — PHENYLEPHRINE HCL-NACL 20-0.9 MG/250ML-% IV SOLN
INTRAVENOUS | Status: DC | PRN
  Administered 2024-09-27: 35 ug/min via INTRAVENOUS

## 2024-09-27 MED ORDER — SODIUM CHLORIDE 0.9 % IV BOLUS: 250.0000 mL | INTRAVENOUS | Status: AC | PRN

## 2024-09-27 MED ORDER — CLEVIDIPINE BUTYRATE 0.5 MG/ML IV EMUL
0.0000 mg/h | INTRAVENOUS | Status: DC
  Administered 2024-09-27: 18 mg/h via INTRAVENOUS
  Administered 2024-09-27: 12 mg/h via INTRAVENOUS
  Administered 2024-09-27: 2 mg/h via INTRAVENOUS
  Administered 2024-09-27: 18 mg/h via INTRAVENOUS
  Administered 2024-09-27: 16 mg/h via INTRAVENOUS
  Administered 2024-09-27: 20 mg/h via INTRAVENOUS
  Administered 2024-09-27: 16 mg/h via INTRAVENOUS
  Filled 2024-09-27: qty 100
  Filled 2024-09-27: qty 50

## 2024-09-27 MED ORDER — TRAMADOL HCL 50 MG PO TABS: 50.0000 mg | ORAL_TABLET | Freq: Four times a day (QID) | ORAL | Status: DC | PRN

## 2024-09-27 MED ORDER — VERAPAMIL HCL 2.5 MG/ML IV SOLN
INTRAVENOUS | Status: AC
  Filled 2024-09-27: qty 2

## 2024-09-27 MED ORDER — ROCURONIUM BROMIDE 10 MG/ML (PF) SYRINGE
PREFILLED_SYRINGE | INTRAVENOUS | Status: DC | PRN
  Administered 2024-09-27 (×2): 50 mg via INTRAVENOUS

## 2024-09-27 MED ORDER — FENTANYL CITRATE (PF) 250 MCG/5ML IJ SOLN
INTRAMUSCULAR | Status: DC | PRN
  Administered 2024-09-27 (×2): 50 ug via INTRAVENOUS

## 2024-09-27 MED ORDER — ASPIRIN 81 MG PO CHEW
81.0000 mg | CHEWABLE_TABLET | Freq: Every day | ORAL | Status: DC
  Administered 2024-09-28: 81 mg
  Filled 2024-09-27: qty 1

## 2024-09-27 MED ORDER — PROPOFOL 1000 MG/100ML IV EMUL
5.0000 ug/kg/min | INTRAVENOUS | Status: DC
  Administered 2024-09-27: 70 ug/kg/min via INTRAVENOUS
  Administered 2024-09-27: 60 ug/kg/min via INTRAVENOUS
  Administered 2024-09-27: 50 ug/kg/min via INTRAVENOUS
  Administered 2024-09-28: 45 ug/kg/min via INTRAVENOUS
  Administered 2024-09-28 (×2): 30 ug/kg/min via INTRAVENOUS
  Filled 2024-09-27 (×8): qty 100

## 2024-09-27 MED ORDER — LABETALOL HCL 5 MG/ML IV SOLN
10.0000 mg | Freq: Four times a day (QID) | INTRAVENOUS | Status: DC | PRN
  Administered 2024-09-27 – 2024-09-28 (×2): 10 mg via INTRAVENOUS
  Filled 2024-09-27 (×2): qty 4

## 2024-09-27 MED ORDER — ASPIRIN 81 MG PO TBEC: 81.0000 mg | DELAYED_RELEASE_TABLET | Freq: Every day | ORAL | Status: DC

## 2024-09-27 MED ORDER — DEXAMETHASONE SODIUM PHOSPHATE 4 MG/ML IJ SOLN: 4.0000 mg | Freq: Four times a day (QID) | INTRAMUSCULAR | Status: DC

## 2024-09-27 MED ORDER — LACTATED RINGERS IV SOLN: INTRAVENOUS | Status: DC | PRN

## 2024-09-27 MED ORDER — ACETAMINOPHEN 325 MG PO TABS
650.0000 mg | ORAL_TABLET | ORAL | Status: DC | PRN
  Administered 2024-09-28 – 2024-09-30 (×7): 650 mg via ORAL
  Filled 2024-09-27 (×7): qty 2

## 2024-09-27 MED ORDER — PHENYLEPHRINE HCL (PRESSORS) 10 MG/ML IV SOLN
INTRAVENOUS | Status: DC | PRN
  Administered 2024-09-27: 80 ug via INTRAVENOUS

## 2024-09-27 MED ORDER — METOCLOPRAMIDE HCL 5 MG/ML IJ SOLN
10.0000 mg | Freq: Once | INTRAMUSCULAR | Status: AC
  Administered 2024-09-27: 10 mg via INTRAVENOUS
  Filled 2024-09-27: qty 2

## 2024-09-27 MED ORDER — ONDANSETRON HCL 4 MG/2ML IJ SOLN
INTRAMUSCULAR | Status: DC | PRN
  Administered 2024-09-27: 4 mg via INTRAVENOUS

## 2024-09-27 MED ORDER — PROPOFOL 1000 MG/100ML IV EMUL
5.0000 ug/kg/min | INTRAVENOUS | Status: DC
  Administered 2024-09-27 (×2): 110 ug/kg/min via INTRAVENOUS

## 2024-09-27 MED ORDER — SUCCINYLCHOLINE CHLORIDE 200 MG/10ML IV SOSY
PREFILLED_SYRINGE | INTRAVENOUS | Status: DC | PRN
  Administered 2024-09-27: 200 mg via INTRAVENOUS

## 2024-09-27 MED ORDER — LABETALOL HCL 5 MG/ML IV SOLN
10.0000 mg | INTRAVENOUS | Status: DC | PRN
  Administered 2024-09-27: 10 mg via INTRAVENOUS

## 2024-09-27 MED ORDER — CLEVIDIPINE BUTYRATE 0.5 MG/ML IV EMUL
0.0000 mg/h | INTRAVENOUS | Status: DC
  Administered 2024-09-27: 20 mg/h via INTRAVENOUS
  Administered 2024-09-27: 21 mg/h via INTRAVENOUS
  Administered 2024-09-27: 16 mg/h via INTRAVENOUS
  Administered 2024-09-27: 13 mg/h via INTRAVENOUS
  Administered 2024-09-28: 15 mg/h via INTRAVENOUS
  Administered 2024-09-28: 2 mg/h via INTRAVENOUS
  Administered 2024-09-28 (×2): 19 mg/h via INTRAVENOUS
  Administered 2024-09-29 (×2): 2 mg/h via INTRAVENOUS
  Administered 2024-09-29 – 2024-09-30 (×4): 21 mg/h via INTRAVENOUS
  Administered 2024-09-30: 7 mg/h via INTRAVENOUS
  Administered 2024-09-30: 20 mg/h via INTRAVENOUS
  Administered 2024-09-30 (×2): 21 mg/h via INTRAVENOUS
  Administered 2024-10-01: 17 mg/h via INTRAVENOUS
  Administered 2024-10-01: 4 mg/h via INTRAVENOUS
  Filled 2024-09-27: qty 100
  Filled 2024-09-27: qty 200
  Filled 2024-09-27: qty 50
  Filled 2024-09-27: qty 200
  Filled 2024-09-27 (×2): qty 100
  Filled 2024-09-27: qty 200
  Filled 2024-09-27 (×3): qty 100

## 2024-09-27 MED ORDER — INSULIN ASPART 100 UNIT/ML IJ SOLN
0.0000 [IU] | INTRAMUSCULAR | Status: DC
  Administered 2024-09-27 (×2): 3 [IU] via SUBCUTANEOUS
  Administered 2024-09-28 – 2024-09-29 (×8): 2 [IU] via SUBCUTANEOUS
  Administered 2024-09-29: 3 [IU] via SUBCUTANEOUS
  Administered 2024-09-30 (×5): 2 [IU] via SUBCUTANEOUS
  Filled 2024-09-27 (×6): qty 2
  Filled 2024-09-27: qty 3
  Filled 2024-09-27: qty 2
  Filled 2024-09-27: qty 3
  Filled 2024-09-27: qty 5
  Filled 2024-09-27: qty 3
  Filled 2024-09-27 (×3): qty 2
  Filled 2024-09-27: qty 5

## 2024-09-27 MED ORDER — PERFLUTREN LIPID MICROSPHERE
1.0000 mL | INTRAVENOUS | Status: AC | PRN
  Administered 2024-09-27: 2 mL via INTRAVENOUS

## 2024-09-27 MED ORDER — PANTOPRAZOLE SODIUM 40 MG IV SOLR
40.0000 mg | Freq: Every day | INTRAVENOUS | Status: DC
  Administered 2024-09-28: 40 mg via INTRAVENOUS
  Filled 2024-09-27 (×2): qty 10

## 2024-09-27 MED ORDER — LIDOCAINE 2% (20 MG/ML) 5 ML SYRINGE
INTRAMUSCULAR | Status: DC | PRN
  Administered 2024-09-27: 100 mg via INTRAVENOUS

## 2024-09-27 MED ORDER — CHLORHEXIDINE GLUCONATE CLOTH 2 % EX PADS
6.0000 | MEDICATED_PAD | Freq: Every day | CUTANEOUS | Status: DC
  Administered 2024-09-27 – 2024-10-12 (×14): 6 via TOPICAL

## 2024-09-27 MED ORDER — IOHEXOL 300 MG/ML  SOLN
150.0000 mL | Freq: Once | INTRAMUSCULAR | Status: AC | PRN
  Administered 2024-09-27: 80 mL via INTRA_ARTERIAL

## 2024-09-27 MED ORDER — LEVETIRACETAM (KEPPRA) 500 MG/5 ML ADULT IV PUSH
500.0000 mg | Freq: Two times a day (BID) | INTRAVENOUS | Status: DC
  Administered 2024-09-27 – 2024-09-29 (×4): 500 mg via INTRAVENOUS
  Filled 2024-09-27 (×4): qty 5

## 2024-09-27 MED ORDER — FENTANYL BOLUS VIA INFUSION
25.0000 ug | INTRAVENOUS | Status: DC | PRN
  Administered 2024-09-27: 100 ug via INTRAVENOUS
  Administered 2024-09-27: 50 ug via INTRAVENOUS

## 2024-09-27 MED ORDER — ORAL CARE MOUTH RINSE: 15.0000 mL | OROMUCOSAL | Status: DC | PRN

## 2024-09-27 MED ORDER — PROPOFOL 10 MG/ML IV BOLUS
INTRAVENOUS | Status: AC
  Filled 2024-09-27: qty 20

## 2024-09-27 MED ORDER — ACETAMINOPHEN 650 MG RE SUPP: 650.0000 mg | RECTAL | Status: DC | PRN

## 2024-09-27 MED ORDER — FENTANYL CITRATE (PF) 100 MCG/2ML IJ SOLN
INTRAMUSCULAR | Status: AC
  Filled 2024-09-27: qty 2

## 2024-09-27 MED ORDER — STROKE: EARLY STAGES OF RECOVERY BOOK
Freq: Once | Status: AC
  Administered 2024-09-28: 1
  Filled 2024-09-27: qty 1

## 2024-09-27 MED ORDER — LABETALOL HCL 5 MG/ML IV SOLN
INTRAVENOUS | Status: AC
  Filled 2024-09-27: qty 4

## 2024-09-27 MED ORDER — DEXMEDETOMIDINE HCL IN NACL 400 MCG/100ML IV SOLN: 0.0000 ug/kg/h | INTRAVENOUS | Status: DC

## 2024-09-27 MED ORDER — PROPOFOL 500 MG/50ML IV EMUL
INTRAVENOUS | Status: DC | PRN
  Administered 2024-09-27: 50 ug/kg/min via INTRAVENOUS

## 2024-09-27 MED ORDER — EPTIFIBATIDE 20 MG/10ML IV SOLN
INTRAVENOUS | Status: AC
  Filled 2024-09-27: qty 10

## 2024-09-27 MED ORDER — DOCUSATE SODIUM 50 MG/5ML PO LIQD
100.0000 mg | Freq: Two times a day (BID) | ORAL | Status: DC
  Administered 2024-09-28: 100 mg
  Filled 2024-09-27: qty 10

## 2024-09-27 MED ORDER — NEBIVOLOL HCL 2.5 MG PO TABS: 2.5000 mg | ORAL_TABLET | Freq: Every day | ORAL | Status: DC

## 2024-09-27 MED ORDER — IOHEXOL 350 MG/ML SOLN
80.0000 mL | Freq: Once | INTRAVENOUS | Status: AC | PRN
  Administered 2024-09-27: 80 mL via INTRAVENOUS

## 2024-09-27 MED ORDER — DEXAMETHASONE SODIUM PHOSPHATE 4 MG/ML IJ SOLN: 4.0000 mg | INTRAMUSCULAR | Status: DC | PRN

## 2024-09-27 MED ORDER — DOCUSATE SODIUM 100 MG PO CAPS: 100.0000 mg | ORAL_CAPSULE | Freq: Two times a day (BID) | ORAL | Status: DC

## 2024-09-27 MED ORDER — CLEVIDIPINE BUTYRATE 0.5 MG/ML IV EMUL
INTRAVENOUS | Status: AC
  Filled 2024-09-27: qty 50

## 2024-09-27 MED ORDER — PANTOPRAZOLE SODIUM 40 MG PO TBEC
40.0000 mg | DELAYED_RELEASE_TABLET | Freq: Every day | ORAL | Status: DC
  Administered 2024-09-29 – 2024-10-12 (×14): 40 mg via ORAL
  Filled 2024-09-27 (×14): qty 1

## 2024-09-27 MED ORDER — HEPARIN SODIUM (PORCINE) 1000 UNIT/ML IJ SOLN
INTRAMUSCULAR | Status: DC | PRN
  Administered 2024-09-27: 1000 [IU] via INTRAVENOUS
  Administered 2024-09-27: 5000 [IU] via INTRAVENOUS

## 2024-09-27 MED ORDER — ONDANSETRON 4 MG PO TBDP
4.0000 mg | ORAL_TABLET | Freq: Four times a day (QID) | ORAL | Status: DC | PRN
  Administered 2024-10-02 – 2024-10-09 (×6): 4 mg via ORAL
  Filled 2024-09-27 (×6): qty 1

## 2024-09-27 MED ORDER — FENTANYL CITRATE (PF) 50 MCG/ML IJ SOSY
PREFILLED_SYRINGE | INTRAMUSCULAR | Status: DC
  Filled 2024-09-27: qty 1

## 2024-09-27 MED ORDER — NIMODIPINE 6 MG/ML PO SOLN
60.0000 mg | ORAL | Status: DC
  Administered 2024-09-27 – 2024-10-09 (×4): 60 mg
  Filled 2024-09-27 (×29): qty 10

## 2024-09-27 MED ORDER — PROPOFOL 10 MG/ML IV BOLUS
INTRAVENOUS | Status: DC | PRN
  Administered 2024-09-27: 200 mg via INTRAVENOUS

## 2024-09-27 MED ORDER — ONDANSETRON HCL 4 MG/2ML IJ SOLN
4.0000 mg | Freq: Once | INTRAMUSCULAR | Status: AC
  Administered 2024-09-27: 4 mg via INTRAVENOUS
  Filled 2024-09-27: qty 2

## 2024-09-27 MED ORDER — ACETAMINOPHEN 160 MG/5ML PO SOLN: 650.0000 mg | ORAL | Status: DC | PRN

## 2024-09-27 MED ORDER — ORAL CARE MOUTH RINSE
15.0000 mL | OROMUCOSAL | Status: DC
  Administered 2024-09-27 – 2024-09-28 (×6): 15 mL via OROMUCOSAL

## 2024-09-27 MED ORDER — LABETALOL HCL 5 MG/ML IV SOLN: 10.0000 mg | Freq: Four times a day (QID) | INTRAVENOUS | Status: DC | PRN

## 2024-09-27 MED ORDER — NIMODIPINE 30 MG PO CAPS
60.0000 mg | ORAL_CAPSULE | ORAL | Status: DC
  Administered 2024-09-28 – 2024-10-11 (×71): 60 mg via ORAL
  Filled 2024-09-27 (×83): qty 2

## 2024-09-27 MED ORDER — ONDANSETRON HCL 4 MG/2ML IJ SOLN
4.0000 mg | Freq: Four times a day (QID) | INTRAMUSCULAR | Status: DC | PRN
  Administered 2024-10-06 – 2024-10-07 (×3): 4 mg via INTRAVENOUS
  Filled 2024-09-27 (×4): qty 2

## 2024-09-27 MED ORDER — DEXAMETHASONE SOD PHOSPHATE PF 10 MG/ML IJ SOLN
INTRAMUSCULAR | Status: DC | PRN
  Administered 2024-09-27: 10 mg via INTRAVENOUS

## 2024-09-27 MED ORDER — FENTANYL CITRATE (PF) 50 MCG/ML IJ SOSY: 25.0000 ug | PREFILLED_SYRINGE | Freq: Once | INTRAMUSCULAR | Status: DC

## 2024-09-27 MED ORDER — SODIUM CHLORIDE 0.9 % IV SOLN: INTRAVENOUS | Status: AC

## 2024-09-27 MED ORDER — CARVEDILOL 12.5 MG PO TABS
12.5000 mg | ORAL_TABLET | Freq: Two times a day (BID) | ORAL | Status: DC
  Administered 2024-09-27 – 2024-09-28 (×3): 12.5 mg
  Filled 2024-09-27 (×3): qty 1

## 2024-09-27 NOTE — Sedation Documentation (Signed)
 Patient moved to table by staff , secured, hooked to monitors, and now under the care of anesthesia. Please see charting in vitals per CRNA.

## 2024-09-27 NOTE — ED Provider Notes (Signed)
 Physical Exam  BP (!) 142/81   Pulse 78   Temp (!) 97.4 F (36.3 C) (Oral)   Resp (!) 21   LMP 10/22/2020   SpO2 99%   Physical Exam Vitals and nursing note reviewed.  HENT:     Head: Normocephalic and atraumatic.  Eyes:     Pupils: Pupils are equal, round, and reactive to light.  Cardiovascular:     Rate and Rhythm: Normal rate and regular rhythm.  Pulmonary:     Effort: Pulmonary effort is normal.     Breath sounds: Normal breath sounds.  Abdominal:     Palpations: Abdomen is soft.     Tenderness: There is no abdominal tenderness.  Skin:    General: Skin is warm and dry.  Neurological:     Mental Status: She is alert.  Psychiatric:        Mood and Affect: Mood normal.     Procedures  .Ultrasound ED Peripheral IV (Provider)  Date/Time: 09/27/2024 8:53 AM  Performed by: Pamella Ozell LABOR, DO Authorized by: Pamella Ozell LABOR, DO   Procedure details:    Indications: poor IV access     Skin Prep: chlorhexidine  gluconate     Location:  Left AC   Angiocath:  20 G   Bedside Ultrasound Guided: Yes     Patient tolerated procedure without complications: Yes     Dressing applied: Yes   .Critical Care  Performed by: Pamella Ozell LABOR, DO Authorized by: Pamella Ozell LABOR, DO   Critical care provider statement:    Critical care time (minutes):  35   Critical care was necessary to treat or prevent imminent or life-threatening deterioration of the following conditions:  CNS failure or compromise   Critical care was time spent personally by me on the following activities:  Blood draw for specimens, development of treatment plan with patient or surrogate, ordering and performing treatments and interventions, ordering and review of laboratory studies, ordering and review of radiographic studies, obtaining history from patient or surrogate, examination of patient and discussions with consultants   I assumed direction of critical care for this patient from another provider in my  specialty: no     Care discussed with: admitting provider   Comments:     Care discussed with ICU and neuroendovascular team   ED Course / MDM   Clinical Course as of 09/27/24 1052  Fri Sep 27, 2024  0706 I personally viewed the images from radiology studies: CT concerning for [facility]. She remains somnolent but answers questions appropriately. Will send for CTA, begin Cleviprex  and consult with Neurovascular.  [CS]  0710 CMP is unremarkable.  [CS]  9280 Spoke with Dr. Ray, Neurovascular, who requests the patient be sent ED-to-ED to [facility] and he will see her there. Dr. Pamella at [facility] is aware. [facility] is enroute.  [CS]  9184 Dr Ray (neuroendovascular) is evaluating patient in the ED.  Planning on endovascular repair this morning.  Arranging for operating suite and anesthesia team [MP]  [phone] Discussed with ICU team NP Claudene who has evaluated patient in the ED.  Patient will be admitted to ICU following intervention.  She remains neurologically intact at this time still reporting a headache and some nausea.  We are uptitrating Cleviprex  for better blood pressure control [MP]    Clinical Course User Index [CS] Roselyn Carlin NOVAK, MD [MP] Pamella Ozell LABOR, DO   Medical Decision Making 53 year old female transferred from [facility] ED for active subarachnoid hemorrhage.  Neurovascular  Dr. Ray is aware  Amount and/or Complexity of Data Reviewed Labs: ordered. Radiology: ordered.  Risk Prescription drug management. Decision regarding hospitalization.          Pamella Ozell LABOR, DO 09/27/24 1052

## 2024-09-27 NOTE — Consult Note (Signed)
 [place] headache was this morning.  No headaches prior to this.  She is drowsy but easily arousable.  Language function is intact.  Visual fields are intact.  Normal grip strength in both hands.  Moves both feet to command.  I reviewed the CT which shows diffuse subarachnoid hemorrhage without hydrocephalus.  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  No other aneurysms.  No evidence of vasospasm.  No carotid artery disease.  Her primary comorbid condition is obesity with a recorded weight of 200 kg.  Also has hypertension and chronic pain.  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  I have explained that once an aneurysm bleeds (ruptures), it is likely to do so again in the near future.  I have explained that re-bleeding frequently causes neurological worsening or death.  For this reason, I am recommending surgery to keep the aneurysm from re-bleeding. I have explained the surgical options, including open surgery (clipping) and endovascular surgery.  I have explained the risks of surgery, including the risk of ischemic stroke or rupture of the aneurysm, which can result in further disability, and even death. My recommendation is proceeding with endovascular treatment. My neurosurgical partner Dr. Rosslyn concurs.  I did have this discussion with both the patient and her sister.  Her sister signed the consent form as the patient is quite drowsy.

## 2024-09-27 NOTE — ED Notes (Signed)
 Unable to collect labs RN kelly aware

## 2024-09-27 NOTE — ED Notes (Signed)
 Per Dr. Lavada coming to transport the patient to [facility]

## 2024-09-27 NOTE — Sedation Documentation (Signed)
 Patient transported to recovery area via stretcher with CRNA.

## 2024-09-27 NOTE — Transfer of Care (Signed)
 Immediate Anesthesia Transfer of Care Note  Patient: Ashlee Gordon  Procedure(s) Performed: RADIOLOGY WITH ANESTHESIA  Patient Location: PACU  Anesthesia Type:General  Level of Consciousness: sedated and Patient remains intubated per anesthesia plan  Airway & Oxygen Therapy: Patient remains intubated per anesthesia plan and Patient placed on Ventilator (see vital sign flow sheet for setting)  Post-op Assessment: Report given to RN and Post -op Vital signs reviewed and stable  Post vital signs: Reviewed and stable  Last Vitals:  Vitals Value Taken Time  BP 131/83 09/27/24 12:54  Temp    Pulse 94 09/27/24 12:57  Resp 18 09/27/24 12:57  SpO2 100 % 09/27/24 12:57  Vitals shown include unfiled device data.  Last Pain:  Vitals:   09/27/24 0847  TempSrc: Oral  PainSc: 10-Worst pain ever         Complications: No notable events documented.

## 2024-09-27 NOTE — ED Notes (Signed)
 Patient transported to CT

## 2024-09-27 NOTE — Anesthesia Procedure Notes (Signed)
 Procedure Name: Intubation Date/Time: 09/27/2024 10:42 AM  Performed by: Arvell Edsel HERO, CRNAPre-anesthesia Checklist: Patient identified, Emergency Drugs available, Suction available, Patient being monitored and Timeout performed Patient Re-evaluated:Patient Re-evaluated prior to induction Oxygen Delivery Method: Circle system utilized Preoxygenation: Pre-oxygenation with 100% oxygen Induction Type: IV induction Ventilation: Oral airway inserted - appropriate to patient size and Two handed mask ventilation required Laryngoscope Size: Glidescope and 3 Grade View: Grade I Tube type: Oral Tube size: 7.0 mm Number of attempts: 1 Airway Equipment and Method: Patient positioned with wedge pillow and Video-laryngoscopy Placement Confirmation: ETT inserted through vocal cords under direct vision, positive ETCO2 and breath sounds checked- equal and bilateral Secured at: 23 cm Tube secured with: Tape Dental Injury: Teeth and Oropharynx as per pre-operative assessment

## 2024-09-27 NOTE — Progress Notes (Signed)
  Echocardiogram 2D Echocardiogram has been performed.  Tinnie FORBES Gosling RDCS 09/27/2024, 9:36 AM

## 2024-09-27 NOTE — Brief Op Note (Signed)
  NEUROSURGERY BRIEF OP NOTE   PREOP DX: Subarachnoid hemorrhage from ruptured left ICA aneurysm  POSTOP DX: Same  PROCEDURE: Coiling with balloon assistance  SURGEON: Nancyann LULLA Burns   ANESTHESIA: GETA  EBL: Less than 50  COMPLICATIONS: No immediate  CONDITION: Stable  FINDINGS:  7 mm left ICA aneurysm coiled with balloon occlusion. No immediate complications.  Nancyann LULLA Burns  @today @ 12:13 PM

## 2024-09-27 NOTE — Sedation Documentation (Signed)
 Bedside report given to RN. Femoral site assessed - Level 0, no hematoma, dressing is clean, dry, and intact. Pulses also assessed bilaterally.

## 2024-09-27 NOTE — ED Notes (Addendum)
 [facility] transported pt to the [facility]. Pt's sister was updated by Dr. Charlyn per pt's request.

## 2024-09-27 NOTE — ED Notes (Signed)
 Family at bedside.

## 2024-09-27 NOTE — Anesthesia Preprocedure Evaluation (Addendum)
 Anesthesia Evaluation  Patient identified by MRN, date of birth, ID band Patient confused    Reviewed: Allergy & Precautions, H&P , NPO status , Patient's Chart, lab work & pertinent test resultsPreop documentation limited or incomplete due to emergent nature of procedure.  Airway Mallampati: III  TM Distance: >3 FB Neck ROM: Full    Dental no notable dental hx. (+) Teeth Intact, Dental Advisory Given   Pulmonary neg pulmonary ROS, sleep apnea    Pulmonary exam normal breath sounds clear to auscultation       Cardiovascular Exercise Tolerance: Good hypertension, Pt. on medications and Pt. on home beta blockers Normal cardiovascular exam Rhythm:Regular Rate:Normal     Neuro/Psych  PSYCHIATRIC DISORDERS  Depression     Neuromuscular disease negative neurological ROS  negative psych ROS   GI/Hepatic negative GI ROS, Neg liver ROS,,,  Endo/Other  negative endocrine ROS    Renal/GU negative Renal ROS  negative genitourinary   Musculoskeletal negative musculoskeletal ROS (+) Arthritis ,  Fibromyalgia -  Abdominal   Peds negative pediatric ROS (+)  Hematology negative hematology ROS (+) Blood dyscrasia, anemia   Anesthesia Other Findings   Reproductive/Obstetrics negative OB ROS                              Anesthesia Physical Anesthesia Plan  ASA: 3 and emergent  Anesthesia Plan: General   Post-op Pain Management: Minimal or no pain anticipated   Induction: Intravenous  PONV Risk Score and Plan: 3  Airway Management Planned: Oral ETT  Additional Equipment: None  Intra-op Plan:   Post-operative Plan:   Informed Consent: I have reviewed the patients History and Physical, chart, labs and discussed the procedure including the risks, benefits and alternatives for the proposed anesthesia with the patient or authorized representative who has indicated his/her understanding and  acceptance.       Plan Discussed with: Anesthesiologist and CRNA  Anesthesia Plan Comments: Ashlee Gordon is a 53 y.o. female arrives by POV for evaluation of sudden onset diffuse headache about an hour ago. Started while her sister and her boyfriend were arguing at the house. She reports BP was elevated and she has been vomiting since headache started. She is unable to provide any more details due to the severity of her headache and difficulty engaging in history taking.   )         Anesthesia Quick Evaluation

## 2024-09-27 NOTE — Anesthesia Procedure Notes (Signed)
 Arterial Line Insertion Start/End12/02/2024 11:30 AM, 09/27/2024 11:30 AM Performed by: attending  Patient location: OOR procedure area. Preanesthetic checklist: patient identified, IV checked, site marked, risks and benefits discussed, surgical consent, monitors and equipment checked, pre-op evaluation, timeout performed and anesthesia consent Right, radial was placed Hand hygiene performed  and maximum sterile barriers used   Attempts: 1 Procedure performed using ultrasound to evaluate access site. Ultrasound Notes:relevant anatomy identified, ultrasound used to visualize needle entry and vessel patent under ultrasound. Following insertion, dressing applied and Biopatch. Post procedure assessment: normal  Patient tolerated the procedure well with no immediate complications.

## 2024-09-27 NOTE — ED Triage Notes (Signed)
  Patient BIB family for headache and emesis that started earlier this morning.  Patient endorses sharp headache that she says is related to her hypertension.  Patient states she has had 10 + episodes of yellowish emesis.  Pain 10/10, sharp.  Denies any abdominal pain, fevers at home, or urinary symptoms.

## 2024-09-27 NOTE — ED Provider Notes (Signed)
  Physical Exam  BP (!) 149/82   Pulse 87   Temp 98.6 F (37 C) (Oral)   Resp 15   LMP 10/22/2020   SpO2 100%   Physical Exam  Procedures  Procedures  ED Course / MDM   Clinical Course as of 09/27/24 0750  Fri Sep 27, 2024  0706 I personally viewed the images from radiology studies: CT concerning for [facility]. She remains somnolent but answers questions appropriately. Will send for CTA, begin Cleviprex  and consult with Neurovascular.  [CS]  0710 CMP is unremarkable.  [CS]  9280 Spoke with Dr. Ray, Neurovascular, who requests the patient be sent ED-to-ED to [facility] and he will see her there. Dr. Pamella at [facility] is aware. [facility] is enroute.  [CS]    Clinical Course User Index [CS] Roselyn Carlin NOVAK, MD   Medical Decision Making Amount and/or Complexity of Data Reviewed Labs: ordered. Radiology: ordered.  Risk Prescription drug management.   I had assumed care of this patient at 7:10. Patient came in with severe headache, sudden while in midst of an argument.  She is found to have subarachnoid bleed.  Dr. Roselyn promptly ordered CT angiogram and consulted neurointerventional, who will accept the patient on ER to ER transfer to [facility]  ED physician, Dr. Pamella made aware.  Patient started on Cleviprex .  On my quick evaluation, patient is oriented to self, location and is complaining of headache.  She is somnolent.  She denies any focal one-sided weakness, numbness, speech disturbance or vision change.  Patient denies any nausea or vomiting.  CT angiogram subsequently reveals large aneurysm. Patient has received Reglan  with the hope of getting some headache relief.  Her exam is unchanged at EMS transfer.  Patient's sister was notified about the findings during transfer with the permission of the patient.       Charlyn Sora, MD 09/27/24 [phone]

## 2024-09-27 NOTE — Consult Note (Signed)
 Neurosurgery / [facility] Consultation   Patient ID: Ashlee Gordon MRN: [id number]; DOB: 04-08-1971  Admit date: 09/27/2024 Date of Consult: 09/27/2024  PCP:  [facility]  Assessment and Plan:  53 yo female with acute SAH secondary to left supraclinoid ICA aneurysm (approx 7mm x 9mm). No hydrocephalus.  SAH secondary to Left ICA aneurysm: Discussed surgical and endovascular treatment options.  Reviewed risks, benefits, alternatives and possible course.  Patient is an appropriate candidate for endovascular therapy to include balloon assisted aneurysm coiling under general anesthesia. Anticipate postop ICU care. Anesthesia consulted. Continue medical optimization preoperatively. External ventricular drain: Will monitor for signs of hydrocephalus - no immediate plans for EVD placement.   History of Present Illness: Ashlee Gordon is a 53 yo female with obesity, HTN, and vascular risk factors that presents with sudden onset of severe headache at approximately 5 am this morning.  She presented to a free standing ED and imaging demonstrated SAH and a left supraclinoid ICA aneurysm with some irregularity and measuring approximately 7x9 mm.  No hydrocephalus.  The patient was reported as somnolent but immediately responsive to stimuli.  She was hypertensive and started on a cleviprex  infusion and subsequently transferred to the [facility] ER.  Upon arrival she is awake and answering questions.  She reports H/A and nausea.  She opens her eyes and has an otherwise non-focal neuro exam.  She is not anticoagulated.  She last ate last night at approximately 10 pm.   Past Medical History:  Diagnosis Date   Anemia    Back pain    Depression    Fibromyalgia    Hypertension    Infertility, female    Joint pain    Osteoarthritis    Osteoarthritis    Prediabetes    Sleep apnea    SOBOE (shortness of breath on exertion)    Swelling of both lower extremities    Vitamin D  deficiency     Past Surgical  History:  Procedure Laterality Date   IR INJECT/THERA/INC NEEDLE/CATH/PLC EPI/LUMB/SAC W/IMG  08/05/2021     Home Medications:  Prior to Admission medications   Medication Sig Start Date End Date Taking? Authorizing Provider  buPROPion  (WELLBUTRIN  XL) 150 MG 24 hr tablet Take 1 tablet (150 mg total) by mouth daily. 04/30/24   Francyne Romano, MD  CAPSAICIN HEAT PATCH EX Apply topically.    [provider]  Cholecalciferol (VITAMIN D3) 50 MCG (2000 UT) capsule Take 1 capsule (2,000 Units total) by mouth daily. Patient not taking: Reported on 08/05/2024 10/24/23   Francyne Romano, MD  Cyanocobalamin (B-12 COMPLIANCE INJECTION) 1000 MCG/ML KIT Inject as directed.    [provider]  cyclobenzaprine  (FLEXERIL ) 10 MG tablet Take 1 tablet (10 mg total) by mouth 2 (two) times daily as needed for muscle spasms. 02/26/24   Small, Brooke L, PA  ergocalciferol  (VITAMIN D2) 1.25 MG (50000 UT) capsule Take 1 capsule (50,000 Units total) by mouth once a week. 07/24/23   Francyne Romano, MD  losartan (COZAAR) 50 MG tablet Take 50 mg by mouth daily.    [provider]  Multiple Vitamin (MULTIVITAMIN ADULT PO) Take by mouth.    [provider]  nebivolol  (BYSTOLIC ) 2.5 MG tablet Take 1 tablet (2.5 mg total) by mouth daily. 03/21/24   Francyne Romano, MD    Scheduled Meds:  ondansetron  (ZOFRAN ) IV  4 mg Intravenous Once   Continuous Infusions:  clevidipine  12 mg/hr (09/27/24 0837)   PRN Meds:   Allergies:    Allergies  Allergen Reactions   Metformin  And Related Nausea Only    Social History:   Social History   Socioeconomic History   Marital status: Single    Spouse name: Not on file   Number of children: Not on file   Years of education: Not on file   Highest education level: Not on file  Occupational History   Occupation: Diplomatic Services Operational Officer  Tobacco Use   Smoking status: Never   Smokeless tobacco: Never  Vaping Use   Vaping status: Every Day   Substance and Sexual Activity   Alcohol use: Yes   Drug use: Not on file   Sexual activity: Not on file  Other Topics Concern   Not on file  Social History Narrative   ** Merged History Encounter **       Social Drivers of Health   Financial Resource Strain: Not on file  Food Insecurity: Not on file  Transportation Needs: Not on file  Physical Activity: Not on file  Stress: Not on file  Social Connections: Unknown (07/12/2022)   Received from [facility]   Social Network    Social Network: Not on file  Intimate Partner Violence: Unknown (07/12/2022)   Received from [facility]   HITS    Scream or Curse: Not on file    Threaten Physical Harm: Not on file    Insult or Talk Down To: Not on file    Physically Hurt: Not on file    Family History:    Family History  Problem Relation Age of Onset   Hypertension Mother    Diabetes Mother    Heart disease Mother    Kidney disease Mother    Sleep apnea Mother    Obesity Mother    Kidney disease Father    Heart disease Father    Breast cancer Neg Hx      ROS:  Please see the history of present illness.   All other ROS reviewed and negative.     Physical Exam/Data: Vitals:   09/27/24 0740 09/27/24 0823 09/27/24 0828 09/27/24 0830  BP: (!) 149/82 (!) 162/95 (!) 196/94 (!) 188/94  Pulse: 87 98 77 83  Resp: 15 20  17   Temp:      TempSrc:      SpO2: 100% 100% 100% 100%   No intake or output data in the 24 hours ending 09/27/24 0844    08/05/2024   10:00 AM 06/11/2024    6:38 AM 05/29/2024   10:00 AM  Last 3 Weights  Weight (lbs) 441 lb 424 lb 424 lb  Weight (kg) 200.036 kg 192.325 kg 192.325 kg     There is no height or weight on file to calculate BMI.  General:  Obese female with visible headache and discomfort HEENT: Opens eyes to rquest Neck: no JVD Vascular: extremities well perfused Cardiac:  RRR Lungs:  non-labored Abd: soft, nontender, no hepatomegaly  Ext: no edema Musculoskeletal:  No  deformities, BUE and BLE strength normal and equal Skin: warm and dry  Neuro:  non-focal Psych:  Normal affect   Laboratory Data: High Sensitivity Troponin:  No results for input(s): TROPONINIHS in the last 720 hours.   Chemistry Recent Labs  Lab 09/27/24 0630  NA 143  K 3.7  CL 104  CO2 28  GLUCOSE 161*  BUN 16  CREATININE 1.01*  CALCIUM 9.7  GFRNONAA >60  ANIONGAP 11    Recent Labs  Lab 09/27/24 0630  PROT 8.2*  ALBUMIN 4.3  AST 25  ALT 29  ALKPHOS 92  BILITOT 0.4   Lipids No results for input(s): CHOL, TRIG, HDL, LABVLDL, LDLCALC, CHOLHDL in the last 168 hours.  Hematology Recent Labs  Lab 09/27/24 0630  WBC 10.5  RBC 5.01  HGB 13.7  HCT 42.8  MCV 85.4  MCH 27.3  MCHC 32.0  RDW 14.1  PLT 266   Thyroid No results for input(s): TSH, FREET4 in the last 168 hours.  BNPNo results for input(s): BNP, PROBNP in the last 168 hours.  DDimer No results for input(s): DDIMER in the last 168 hours.  Radiology/Studies:  CTA 09/27/24:  IMPRESSION: 1. 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 2. No additional intracranial aneurysm identified. 3. 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  I spent 40 minutes seeing this patient. During that time I reviewed their history, evaluated their symptoms, reviewed available labs, EKGs, studies, performed an exam and formulated an assessment and plan     RAY COY, MD  09/27/2024 8:44 AM

## 2024-09-27 NOTE — Progress Notes (Signed)
 Patient arrived to unit, BP out of range, cleviprex  maxed. Labetolol ordered. Per Dr. Lester, ok to sit up patient as needed. Groin site soft, level 0; pulses in RLE palpable.

## 2024-09-27 NOTE — ED Notes (Signed)
 PT arrived via [facility] with SH told measuring 8mm. PT aox4, complaining of HA.

## 2024-09-27 NOTE — Sedation Documentation (Signed)
ACT 189

## 2024-09-27 NOTE — H&P (Signed)
 NAME:  Ashlee Gordon, MRN:  [id number], DOB:  Jan 12, 1971, LOS: 0 ADMISSION DATE:  09/27/2024, CONSULTATION DATE:  12/5  REFERRING MD: Pamella CHIEF COMPLAINT:  Severe Headache   History of Present Illness:  Patient is a 53 year old female with significant past medical history of sleep apnea, hypertension, obesity, prediabetes, depression, fibromyalgia, anemia, and osteoarthritis who presented to [facility] ED after a sudden onset of a severe headache approximately around 0 530 on 12/5.  Per report, severe headache occurred while her sister and boyfriend were arguing at the residence.  Patient was immediately transported to CT scan-for stat head showing concerns of a acute subarachnoid hemorrhage, aneurysmal rupture, mild ventriculomegaly, no intraventricular hemorrhage and no midline shift.  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  Neurovascular was consulted and requested patient be transferred to [facility], ED for further neuro eval and and possible IR intervention.  Patient was started on Cleviprex  due to hypertension.  Upon arrival, patient is nauseous and also drowsy but not having no focal neuro deficits. PCCM consulted for admission to 4N ICU after endovascular intervention- aneurysm coiling and possible EVD placement.   Upon ED assessment, patient drowsy, still complaining of headache. Patient able to state name, and oriented to place but not able to state time. Patient has no focal neuro deficits at this time, able to move all extremities but does have generalized weakness. Patient on cleviprex  at this time with ongoing titration.   Pertinent  Medical History   Past Medical History:  Diagnosis Date   Anemia    Back pain    Depression    Fibromyalgia    Hypertension    Infertility, female    Joint pain    Osteoarthritis    Osteoarthritis    Prediabetes    Sleep apnea    SOBOE  (shortness of breath on exertion)    Swelling of both lower extremities    Vitamin D  deficiency      Significant Hospital Events: Including procedures, antibiotic start and stop dates in addition to other pertinent events   12/5 PCCM admit- SAH due to Left ICA aneurysm, plan for coiling by NIR, eval for EVD  Interim History / Subjective:  Drowsy   Able to follow basic commands  Nauseous   Objective    Blood pressure (!) 144/84, pulse 90, temperature (!) 97.4 F (36.3 C), temperature source Oral, resp. rate 18, last menstrual period 10/22/2020, SpO2 100%.       No intake or output data in the 24 hours ending 09/27/24 0853 There were no vitals filed for this visit.  Examination: General: acute on chronic, ill appearing obese, adult female in NAD HENT: Normocephalic, PERRLA intact, teeth intact  Lungs: diminished, on RA  Cardiovascular: s1,s2, RRR, no JVD, no MRG Abdomen: BS active  Extremities: follows commands, moves all extremities  Neuro: drowsy, severe headache, oriented to self, place, situation, not time  GU: deferred   Resolved problem list   Assessment and Plan  Subarachnoid Hemorrhage due to Left ICA Aneurysm  H Hunt Score 3 HTN hx  P: NSGY and neuro IR consulted SBP goal less than 140- continue cleviprex   Maintain neuro protective measures; goal for eurothermia, euglycemia, eunatermia, normoxia, and PCO2 goal of 35-40 Nutrition and bowel regiment  Seizure precautions  Aspirations precautions  Empiric Keppra  per NSGY Stat imaging for any acute neurochanges Avoid narcotics as able, utilize dexamethasone  for  pain As needed Zofran  for nausea Will place aline for further BP management   Sleep Apnea hx Probably in setting of obesity  P: Would benefit from sleep study outpatient   Fibromyalgia  P: Hold pain and sedative medications at this time Supportive care   Anemia  Hgb stable, as well as plts P: Send anemia panel morning labs  Prediabetes   P: Check hgb A1c  SSI  CBG q 4   Depression P: Supportive care   Labs   CBC: Recent Labs  Lab 09/27/24 0630  WBC 10.5  NEUTROABS 6.6  HGB 13.7  HCT 42.8  MCV 85.4  PLT 266    Basic Metabolic Panel: Recent Labs  Lab 09/27/24 0630  NA 143  K 3.7  CL 104  CO2 28  GLUCOSE 161*  BUN 16  CREATININE 1.01*  CALCIUM 9.7   GFR: CrCl cannot be calculated (Unknown ideal weight.). Recent Labs  Lab 09/27/24 0630  WBC 10.5    Liver Function Tests: Recent Labs  Lab 09/27/24 0630  AST 25  ALT 29  ALKPHOS 92  BILITOT 0.4  PROT 8.2*  ALBUMIN 4.3   No results for input(s): LIPASE, AMYLASE in the last 168 hours. No results for input(s): AMMONIA in the last 168 hours.  ABG No results found for: PHART, PCO2ART, PO2ART, HCO3, TCO2, ACIDBASEDEF, O2SAT   Coagulation Profile: Recent Labs  Lab 09/27/24 0630  INR 0.9    Cardiac Enzymes: No results for input(s): CKTOTAL, CKMB, CKMBINDEX, TROPONINI in the last 168 hours.  HbA1C: Hgb A1c MFr Bld  Date/Time Value Ref Range Status  07/24/2023 11:26 AM 6.1 (H) 4.8 - 5.6 % Final    Comment:             Prediabetes: 5.7 - 6.4          Diabetes: >6.4          Glycemic control for adults with diabetes: <7.0   11/22/2022 10:16 AM 6.1 (H) 4.8 - 5.6 % Final    Comment:             Prediabetes: 5.7 - 6.4          Diabetes: >6.4          Glycemic control for adults with diabetes: <7.0     CBG: No results for input(s): GLUCAP in the last 168 hours.  Review of Systems:   Review of Systems  Gastrointestinal:  Positive for nausea.  Neurological:  Positive for headaches.     Past Medical History:  She,  has a past medical history of Anemia, Back pain, Depression, Fibromyalgia, Hypertension, Infertility, female, Joint pain, Osteoarthritis, Osteoarthritis, Prediabetes, Sleep apnea, SOBOE (shortness of breath on exertion), Swelling of both lower extremities, and Vitamin D  deficiency.    Surgical History:   Past Surgical History:  Procedure Laterality Date   IR INJECT/THERA/INC NEEDLE/CATH/PLC EPI/LUMB/SAC W/IMG  08/05/2021     Social History:   reports that she has never smoked. She has never used smokeless tobacco. She reports current alcohol use.   Family History:  Her family history includes Diabetes in her mother; Heart disease in her father and mother; Hypertension in her mother; Kidney disease in her father and mother; Obesity in her mother; Sleep apnea in her mother. There is no history of Breast cancer.   Allergies Allergies  Allergen Reactions   Metformin  And Related Nausea Only     Home Medications  Prior to Admission medications   Medication Sig Start  Date End Date Taking? Authorizing Provider  buPROPion  (WELLBUTRIN  XL) 150 MG 24 hr tablet Take 1 tablet (150 mg total) by mouth daily. 04/30/24   Francyne Romano, MD  CAPSAICIN HEAT PATCH EX Apply topically.    [provider]  Cholecalciferol (VITAMIN D3) 50 MCG (2000 UT) capsule Take 1 capsule (2,000 Units total) by mouth daily. Patient not taking: Reported on 08/05/2024 10/24/23   Francyne Romano, MD  Cyanocobalamin (B-12 COMPLIANCE INJECTION) 1000 MCG/ML KIT Inject as directed.    [provider]  cyclobenzaprine  (FLEXERIL ) 10 MG tablet Take 1 tablet (10 mg total) by mouth 2 (two) times daily as needed for muscle spasms. 02/26/24   Small, Brooke L, PA  ergocalciferol  (VITAMIN D2) 1.25 MG (50000 UT) capsule Take 1 capsule (50,000 Units total) by mouth once a week. 07/24/23   Francyne Romano, MD  losartan (COZAAR) 50 MG tablet Take 50 mg by mouth daily.    [provider]  Multiple Vitamin (MULTIVITAMIN ADULT PO) Take by mouth.    [provider]  nebivolol  (BYSTOLIC ) 2.5 MG tablet Take 1 tablet (2.5 mg total) by mouth daily. 03/21/24   Francyne Romano, MD     Critical care time: 65 mins     [facility] 09/27/2024, 9:17 AM  Please see [email or web address] for pager details.  From 7A-7P if no response, please call [phone]. After hours, please call ELink [phone].

## 2024-09-27 NOTE — Progress Notes (Signed)
 RT accompanied transport from PACU to 4N24. No adverse events occurred en route. Patient suctioned prior to leaving PACU and upon arrival to ICU. Ventilator plugged into red outlet and wall air/o2.

## 2024-09-27 NOTE — Progress Notes (Signed)
 Transcranial Doppler  Date POD PCO2 HCT BP  MCA ACA PCA OPHT SIPH VERT Basilar  12/5 GC     Right  Left   86  74   *  -33   47  *   23  13   19  23    -17  -25   -15           Right  Left                                            Right  Left                                             Right  Left                                             Right  Left                                            Right  Left                                            Right  Left                                        MCA = Middle Cerebral Artery      OPHT = Opthalmic Artery     BASILAR = Basilar Artery   ACA = Anterior Cerebral Artery     SIPH = Carotid Siphon PCA = Posterior Cerebral Artery   VERT = Verterbral Artery                   Normal MCA = 62+\-12 ACA = 50+\-12 PCA = 42+\-23     * Unable to insonate  09/27/24 12:56 PM Cathlyn Collet RVT

## 2024-09-27 NOTE — ED Notes (Signed)
 Pt transported to CT with RN on transport monitor.

## 2024-09-27 NOTE — ED Notes (Signed)
CCM at bedside

## 2024-09-27 NOTE — ED Provider Notes (Signed)
 [place] [facility]MENT AT [facility]  Provider Note  CSN: [id number] Arrival date & time: 09/27/24 0605  History Chief Complaint  Patient presents with   Headache   Emesis    Ashlee Gordon is a 53 y.o. female arrives by POV for evaluation of sudden onset diffuse headache about an hour ago. Started while her sister and her boyfriend were arguing at the house. She reports BP was elevated and she has been vomiting since headache started. She is unable to provide any more details due to the severity of her headache and difficulty engaging in history taking.    Home Medications Prior to Admission medications   Medication Sig Start Date End Date Taking? Authorizing Provider  buPROPion  (WELLBUTRIN  XL) 150 MG 24 hr tablet Take 1 tablet (150 mg total) by mouth daily. 04/30/24   Francyne Romano, MD  CAPSAICIN HEAT PATCH EX Apply topically.    [provider]  Cholecalciferol (VITAMIN D3) 50 MCG (2000 UT) capsule Take 1 capsule (2,000 Units total) by mouth daily. Patient not taking: Reported on 08/05/2024 10/24/23   Francyne Romano, MD  Cyanocobalamin (B-12 COMPLIANCE INJECTION) 1000 MCG/ML KIT Inject as directed.    [provider]  cyclobenzaprine  (FLEXERIL ) 10 MG tablet Take 1 tablet (10 mg total) by mouth 2 (two) times daily as needed for muscle spasms. 02/26/24   Small, Brooke L, PA  ergocalciferol  (VITAMIN D2) 1.25 MG (50000 UT) capsule Take 1 capsule (50,000 Units total) by mouth once a week. 07/24/23   Francyne Romano, MD  losartan (COZAAR) 50 MG tablet Take 50 mg by mouth daily.    [provider]  Multiple Vitamin (MULTIVITAMIN ADULT PO) Take by mouth.    [provider]  nebivolol  (BYSTOLIC ) 2.5 MG tablet Take 1 tablet (2.5 mg total) by mouth daily. 03/21/24   Francyne Romano, MD     Allergies    Metformin  and related   Review of Systems   Review of Systems Please see HPI for pertinent positives and  negatives  Physical Exam BP (!) 175/87   Pulse 92   Temp 98.6 F (37 C) (Oral)   Resp 20   LMP 10/22/2020   SpO2 98%   Physical Exam Vitals and nursing note reviewed.  Constitutional:      Appearance: Normal appearance. She is obese.     Comments: Eye closed  HENT:     Head: Normocephalic and atraumatic.     Nose: Nose normal.     Mouth/Throat:     Mouth: Mucous membranes are moist.  Eyes:     Extraocular Movements: Extraocular movements intact.     Conjunctiva/sclera: Conjunctivae normal.  Cardiovascular:     Rate and Rhythm: Normal rate.  Pulmonary:     Effort: Pulmonary effort is normal.     Breath sounds: Normal breath sounds.  Abdominal:     General: Abdomen is flat.     Palpations: Abdomen is soft.     Tenderness: There is no abdominal tenderness.  Musculoskeletal:        General: No swelling. Normal range of motion.     Cervical back: Neck supple.  Skin:    General: Skin is warm and dry.     Findings: No rash (on exposed skin).  Neurological:     General: No focal deficit present.     Mental Status: She is alert and oriented to person, place, and time.     Cranial Nerves: No cranial nerve deficit.  Sensory: No sensory deficit.     Motor: No weakness.     Gait: Gait normal.  Psychiatric:        Mood and Affect: Mood normal.     ED Results / Procedures / Treatments   EKG EKG Interpretation Date/Time:  Friday September 27 2024 07:11:40 EST Ventricular Rate:  68 PR Interval:  202 QRS Duration:  82 QT Interval:  492 QTC Calculation: 524 R Axis:   -15  Text Interpretation: Sinus rhythm Borderline prolonged PR interval Borderline left axis deviation Borderline T abnormalities, diffuse leads Prolonged QT interval No old tracing to compare Confirmed by Roselyn Dunnings [phone]) on 09/27/2024 7:13:17 AM  Procedures .Critical Care  Performed by: Roselyn Dunnings NOVAK, MD Authorized by: Roselyn Dunnings NOVAK, MD   Critical care provider statement:     Critical care time (minutes):  45   Critical care time was exclusive of:  Separately billable procedures and treating other patients   Critical care was necessary to treat or prevent imminent or life-threatening deterioration of the following conditions:  CNS failure or compromise   Critical care was time spent personally by me on the following activities:  Development of treatment plan with patient or surrogate, discussions with consultants, evaluation of patient's response to treatment, examination of patient, obtaining history from patient or surrogate, ordering and performing treatments and interventions, ordering and review of laboratory studies, ordering and review of radiographic studies, pulse oximetry and re-evaluation of patient's condition   Care discussed with: admitting provider     Medications Ordered in the ED Medications  clevidipine  (CLEVIPREX ) infusion 0.5 mg/mL (has no administration in time range)  iohexol  (OMNIPAQUE ) 350 MG/ML injection 80 mL (80 mLs Intravenous Contrast Given 09/27/24 0717)    Initial Impression and Plan  Patient here with sudden onset headache and vomiting, noted to be hypertensive here. No focal neuro deficits. Will check labs, head CT for signs of end organ damage.   ED Course   Clinical Course as of 09/27/24 0721  Fri Sep 27, 2024  0706 I personally viewed the images from radiology studies: CT concerning for [facility]. She remains somnolent but answers questions appropriately. Will send for CTA, begin Cleviprex  and consult with Neurovascular.  [CS]  0710 CMP is unremarkable.  [CS]  9280 Spoke with Dr. Ray, Neurovascular, who requests the patient be sent ED-to-ED to [facility] and he will see her there. Dr. Pamella at [facility] is aware. [facility] is enroute.  [CS]    Clinical Course User Index [CS] Roselyn Dunnings NOVAK, MD     MDM Rules/Calculators/A&P Medical Decision Making Problems Addressed: SAH (subarachnoid hemorrhage) [facility]): acute illness or injury  that poses a threat to life or bodily functions  Amount and/or Complexity of Data Reviewed Labs: ordered. Decision-making details documented in ED Course. Radiology: ordered and independent interpretation performed. Decision-making details documented in ED Course. ECG/medicine tests: ordered and independent interpretation performed. Decision-making details documented in ED Course.  Risk Prescription drug management. Decision regarding hospitalization.     Final Clinical Impression(s) / ED Diagnoses Final diagnoses:  SAH (subarachnoid hemorrhage) (HCC)    Rx / DC Orders ED Discharge Orders     None        Roselyn Dunnings NOVAK, MD 09/27/24 [phone]

## 2024-09-28 ENCOUNTER — Inpatient Hospital Stay (HOSPITAL_COMMUNITY)

## 2024-09-28 LAB — GLUCOSE, CAPILLARY
Glucose-Capillary: 124 mg/dL — ABNORMAL HIGH (ref 70–99)
Glucose-Capillary: 148 mg/dL — ABNORMAL HIGH (ref 70–99)
Glucose-Capillary: 131 mg/dL — ABNORMAL HIGH (ref 70–99)
Glucose-Capillary: 115 mg/dL — ABNORMAL HIGH (ref 70–99)
Glucose-Capillary: 123 mg/dL — ABNORMAL HIGH (ref 70–99)
Glucose-Capillary: 122 mg/dL — ABNORMAL HIGH (ref 70–99)

## 2024-09-28 LAB — BASIC METABOLIC PANEL WITH GFR
Anion gap: 13 (ref 5–15)
BUN: 18 mg/dL (ref 6–20)
CO2: 26 mmol/L (ref 22–32)
Calcium: 7.9 mg/dL — ABNORMAL LOW (ref 8.9–10.3)
Chloride: 101 mmol/L (ref 98–111)
Creatinine, Ser: 1.4 mg/dL — ABNORMAL HIGH (ref 0.44–1.00)
GFR, Estimated: 45 mL/min — ABNORMAL LOW (ref >60)
Glucose, Bld: 129 mg/dL — ABNORMAL HIGH (ref 70–99)
Potassium: 4.1 mmol/L (ref 3.5–5.1)
Sodium: 140 mmol/L (ref 135–145)

## 2024-09-28 LAB — MAGNESIUM: Magnesium: 2.1 mg/dL (ref 1.7–2.4)

## 2024-09-28 LAB — PHOSPHORUS: Phosphorus: 6.5 mg/dL — ABNORMAL HIGH (ref 2.5–4.6)

## 2024-09-28 MED ORDER — ASPIRIN 81 MG PO CHEW
81.0000 mg | CHEWABLE_TABLET | Freq: Every day | ORAL | Status: DC
  Administered 2024-09-29 – 2024-10-12 (×14): 81 mg via ORAL
  Filled 2024-09-28 (×14): qty 1

## 2024-09-28 MED ORDER — OXYCODONE HCL 5 MG PO TABS
5.0000 mg | ORAL_TABLET | ORAL | Status: DC | PRN
  Administered 2024-09-28 – 2024-10-06 (×22): 5 mg via ORAL
  Filled 2024-09-28 (×23): qty 1

## 2024-09-28 MED ORDER — DOCUSATE SODIUM 100 MG PO CAPS
100.0000 mg | ORAL_CAPSULE | Freq: Two times a day (BID) | ORAL | Status: DC
  Administered 2024-09-28 – 2024-09-29 (×2): 100 mg via ORAL
  Filled 2024-09-28 (×2): qty 1

## 2024-09-28 MED ORDER — HYDROMORPHONE HCL 1 MG/ML IJ SOLN
1.0000 mg | INTRAMUSCULAR | Status: AC | PRN
  Administered 2024-09-28 – 2024-09-29 (×4): 1 mg via INTRAVENOUS
  Filled 2024-09-28 (×3): qty 1

## 2024-09-28 MED ORDER — AMLODIPINE BESYLATE 10 MG PO TABS
10.0000 mg | ORAL_TABLET | Freq: Every day | ORAL | Status: DC
  Administered 2024-09-28: 10 mg
  Filled 2024-09-28: qty 1

## 2024-09-28 MED ORDER — AMLODIPINE BESYLATE 5 MG PO TABS
10.0000 mg | ORAL_TABLET | Freq: Every day | ORAL | Status: DC
  Administered 2024-09-29 – 2024-10-12 (×14): 10 mg via ORAL
  Filled 2024-09-28 (×2): qty 1
  Filled 2024-09-28 (×4): qty 2
  Filled 2024-09-28 (×5): qty 1
  Filled 2024-09-28: qty 2
  Filled 2024-09-28: qty 1
  Filled 2024-09-28: qty 2
  Filled 2024-09-28: qty 1
  Filled 2024-09-28: qty 2

## 2024-09-28 MED ORDER — ORAL CARE MOUTH RINSE: 15.0000 mL | OROMUCOSAL | Status: DC | PRN

## 2024-09-28 MED ORDER — HYDROMORPHONE HCL 1 MG/ML IJ SOLN
INTRAMUSCULAR | Status: AC
  Filled 2024-09-28: qty 1

## 2024-09-28 MED ORDER — HYDROMORPHONE HCL 1 MG/ML IJ SOLN
0.5000 mg | INTRAMUSCULAR | Status: DC | PRN
  Administered 2024-09-28: 0.5 mg via INTRAVENOUS
  Filled 2024-09-28: qty 1

## 2024-09-28 MED ORDER — CARVEDILOL 12.5 MG PO TABS
12.5000 mg | ORAL_TABLET | Freq: Two times a day (BID) | ORAL | Status: DC
  Administered 2024-09-29 (×2): 12.5 mg via ORAL
  Filled 2024-09-28 (×2): qty 1

## 2024-09-28 NOTE — Progress Notes (Signed)
 Neurosurgery / NIR Progress Note:  53 yo female with acute SAH secondary to ruptured left supraclinoid ICA aneurysm rupture.  POD#1 s/p balloon assisted coiling.  Assessment and Plan: SAH s/p coil embolization: Expected changes by CT. From NSGY / NIR standpoint, wean sedation with goal of extubation and supportive postoperative care. No indication for EVD at this time.  Continue aspirin  therapy and aggressive BP management. Please call with questions, concerns, or change in patient condition.  Overnight Events: Patient remains intubated and on propofol .  Exam is reported as stable overnight.  CT head notable for expected changes of SAH and coil embolization.  Subjective: Intubated and sedated.  Objective: Physical Exam: BP 113/78   Pulse 84   Temp 98.1 F (36.7 C) (Axillary)   Resp 18   Ht 5' 7.99 (1.727 m)   Wt (!) 205.6 kg   LMP 10/22/2020   SpO2 100%   BMI 68.94 kg/m   Arrousable on sedation Mild facial swelling Raises left arm and both legs to command Right arm raises to horizontal (but is attached to multiple Ivs and A-line) Access site C/D/I Distal extremity warm and well perfused  CT Head 09/28/2024  IMPRESSION: 1. 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 2. Extensive subarachnoid hemorrhage, most pronounced in the interhemispheric fissure and basilar cisterns, similar to prior exam. 3. No definite evidence of cerebral infarction, mass effect, midline shift, or hydrocephalus. 4. Study significantly degraded by patient motion and spray artifact from the embolization coils.   Current Facility-Administered Medications:     stroke: early stages of recovery book, , Does not apply, Once, Claudene Fonda BROCKS, NP   0.9 %  sodium chloride  infusion, , Intravenous, Continuous, Claudene Fonda C, NP, Last Rate: 100 mL/hr at 09/28/24 0700, Infusion Verify at 09/28/24 0700   acetaminophen  (TYLENOL )  tablet 650 mg, 650 mg, Oral, Q4H PRN **OR** acetaminophen  (TYLENOL ) 160 MG/5ML solution 650 mg, 650 mg, Per Tube, Q4H PRN **OR** acetaminophen  (TYLENOL ) suppository 650 mg, 650 mg, Rectal, Q4H PRN, Claudene Fonda BROCKS, NP   aspirin  chewable tablet 81 mg, 81 mg, Per Tube, Daily, Laurence Jinnie BIRCH, [facility]   carvedilol  (COREG ) tablet 12.5 mg, 12.5 mg, Per Tube, BID WC, Paliwal, Aditya, MD, 12.5 mg at 09/27/24 2203   Chlorhexidine  Gluconate Cloth 2 % PADS 6 each, 6 each, Topical, Daily, Claudene Fonda C, NP, 6 each at 09/27/24 1728   clevidipine  (CLEVIPREX ) infusion 0.5 mg/mL, 0-21 mg/hr, Intravenous, Continuous, Lester Golas, MD, Last Rate: 24 mL/hr at 09/28/24 0700, 12 mg/hr at 09/28/24 0700   dexmedetomidine  (PRECEDEX ) 400 MCG/100ML (4 mcg/mL) infusion, 0-1.2 mcg/kg/hr, Intravenous, Titrated, Kassie Acquanetta Bradley, MD, Held at 09/27/24 2044   docusate (COLACE) 50 MG/5ML liquid 100 mg, 100 mg, Per Tube, BID, Chen, Lydia D, [facility]   fentaNYL  (SUBLIMAZE ) bolus via infusion 25-100 mcg, 25-100 mcg, Intravenous, Q15 min PRN, Smith, Joshua C, NP, 50 mcg at 09/27/24 2219   fentaNYL  (SUBLIMAZE ) injection 25-50 mcg, 25-50 mcg, Intravenous, Once, Claudene Fonda C, NP   fentaNYL  in NS (2mcg/ml) infusion-PREMIX, 0-200 mcg/hr, Intravenous, Continuous, Claudene Fonda C, NP, Last Rate: 5 mL/hr at 09/28/24 0700, 50 mcg/hr at 09/28/24 0700   insulin  aspart (novoLOG ) injection 0-15 Units, 0-15 Units, Subcutaneous, Q4H, Smith, Joshua C, NP, 2 Units at 09/28/24 0410   labetalol  (NORMODYNE ) injection 10 mg, 10 mg, Intravenous, Q6H PRN, Kassie Acquanetta Bradley, MD, 10 mg at 09/27/24 1739   levETIRAcetam  (KEPPRA ) undiluted injection  500 mg, 500 mg, Intravenous, Q12H, Claudene Chew C, NP, 500 mg at 09/27/24 2051   niMODipine  (NIMOTOP ) capsule 60 mg, 60 mg, Oral, Q4H **OR** niMODipine  (NYMALIZE ) 6 MG/ML oral solution 60 mg, 60 mg, Per Tube, Q4H, Claudene Chew BROCKS, NP, 60 mg at 09/28/24 0411   ondansetron  (ZOFRAN -ODT) disintegrating tablet 4  mg, 4 mg, Oral, Q6H PRN **OR** ondansetron  (ZOFRAN ) injection 4 mg, 4 mg, Intravenous, Q6H PRN, Claudene Chew BROCKS, NP   Oral care mouth rinse, 15 mL, Mouth Rinse, Q2H, Kassie Acquanetta Bradley, MD, 15 mL at 09/28/24 0615   Oral care mouth rinse, 15 mL, Mouth Rinse, PRN, Kassie Acquanetta Bradley, MD   pantoprazole  (PROTONIX ) EC tablet 40 mg, 40 mg, Oral, Daily **OR** pantoprazole  (PROTONIX ) injection 40 mg, 40 mg, Intravenous, Daily, Claudene Chew C, NP   propofol  (DIPRIVAN ) 1000 MG/100ML infusion, 5-70 mcg/kg/min, Intravenous, Titrated, Kassie Acquanetta Bradley, MD, Last Rate: 36 mL/hr at 09/28/24 0702, 30 mcg/kg/min at 09/28/24 0702   sodium chloride  0.9 % bolus 250 mL, 250 mL, Intravenous, PRN, Lester Golas, MD   traMADol  (ULTRAM ) tablet 50-100 mg, 50-100 mg, Oral, Q6H PRN, Claudene Chew BROCKS, NP  Labs: CBC Recent Labs    09/27/24 0630  WBC 10.5  HGB 13.7  HCT 42.8  PLT 266   BMET Recent Labs    09/27/24 0630 09/28/24 0441  NA 143 140  K 3.7 4.1  CL 104 101  CO2 28 26  GLUCOSE 161* 129*  BUN 16 18  CREATININE 1.01* 1.40*  CALCIUM 9.7 7.9*   LFT Recent Labs    09/27/24 0630  PROT 8.2*  ALBUMIN 4.3  AST 25  ALT 29  ALKPHOS 92  BILITOT 0.4   PT/INR Recent Labs    09/27/24 0630  LABPROT 12.8  INR 0.9    LOS: 1 day   I spent a total of 15 minutes in face to face in clinical consultation, greater than 50% of which was counseling/coordinating care.  RAY COY 09/28/2024 7:17 AM

## 2024-09-28 NOTE — Progress Notes (Signed)
 Extubation Procedure Note  Patient Details:   Name: Ashlee Gordon DOB: Apr 27, 1971 MRN: [id number]   Airway Documentation:    Vent end date: 09/28/24 Vent end time: 0843   Evaluation  O2 sats: stable throughout Complications: No apparent complications Patient did tolerate procedure well. Bilateral Breath Sounds: Clear, Diminished   Yes  Ashlee Gordon Cavalier 09/28/2024, 8:44 AM

## 2024-09-28 NOTE — Progress Notes (Signed)
 NAME:  Ashlee Gordon, MRN:  [id number], DOB:  01-02-1971, LOS: 1 ADMISSION DATE:  09/27/2024, CONSULTATION DATE:  12/5  REFERRING MD: Pamella CHIEF COMPLAINT:  Severe Headache   History of Present Illness:  Patient is a 53 year old female with significant past medical history of sleep apnea, hypertension, obesity, prediabetes, depression, fibromyalgia, anemia, and osteoarthritis who presented [facility] ED after a sudden onset of a severe headache approximately around 0 530 on 12/5.  Per report, severe headache occurred while her sister and boyfriend were arguing at the residence.  Patient was immediately transported to CT scan-for stat head showing concerns of a acute subarachnoid hemorrhage, aneurysmal rupture, mild ventriculomegaly, no intraventricular hemorrhage and no midline shift.  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  Neurovascular was consulted and requested patient be transferred to [facility], ED for further neuro eval and and possible IR intervention.  Patient was started on Cleviprex  due to hypertension.  Upon arrival, patient is nauseous and also drowsy but not having no focal neuro deficits. PCCM consulted for admission to 4N ICU after endovascular intervention- aneurysm coiling and possible EVD placement.   Upon ED assessment, patient drowsy, still complaining of headache. Patient able to state name, and oriented to place but not able to state time. Patient has no focal neuro deficits at this time, able to move all extremities but does have generalized weakness. Patient on cleviprex  at this time with ongoing titration.   Pertinent  Medical History   Past Medical History:  Diagnosis Date   Anemia    Back pain    Depression    Fibromyalgia    Hypertension    Infertility, female    Joint pain    Osteoarthritis    Osteoarthritis    Prediabetes    Sleep apnea    SOBOE  (shortness of breath on exertion)    Swelling of both lower extremities    Vitamin D  deficiency      Significant Hospital Events: Including procedures, antibiotic start and stop dates in addition to other pertinent events   12/5 PCCM admit- SAH due to Left ICA aneurysm, plan for coiling by NIR, eval for EVD  Interim History / Subjective:  Tolerating SBT On propofol  and fentanyl   Objective    Blood pressure 113/78, pulse 90, temperature 98.1 F (36.7 C), temperature source Axillary, resp. rate 13, height 5' 7.99 (1.727 m), weight (!) 205.6 kg, last menstrual period 10/22/2020, SpO2 100%.    Vent Mode: PSV;CPAP FiO2 (%):  [40 %-60 %] 40 % Set Rate:  [18 bmp] 18 bmp Vt Set:  [500 mL] 500 mL PEEP:  [5 cmH20] 5 cmH20 Pressure Support:  [5 cmH20] 5 cmH20 Plateau Pressure:  [21 cmH20-25 cmH20] 21 cmH20   Intake/Output Summary (Last 24 hours) at 09/28/2024 0803 Last data filed at 09/28/2024 0700 Gross per 24 hour  Intake 4095.97 ml  Output 1050 ml  Net 3045.97 ml   Filed Weights   09/27/24 1345 09/28/24 0315  Weight: (!) 200 kg (!) 205.6 kg   Physical Exam: General: Well-appearing, no acute distress HENT: [place], AT, ETT in place Eyes: Right eye drooping, EOMI, no scleral icterus Respiratory: Clear to auscultation bilaterally.  No crackles, wheezing or rales Cardiovascular: RRR, -M/R/G, no JVD GI: BS+, soft, nontender Extremities:-Edema,-tenderness Neuro: Awake, follows commands, PERRL, right eye drooping, right 4+/5 RUE and  4-5/5 LUE and bilateral lower extremities  GU:  Foley in place  Imaging, labs and test in EMR in the last 24 hours reviewed independently by me. Pertinent findings below: CT head 09/28/24 - S/p coiling of left ICA aneurysm, Similar SAH, nod midline shift/mass effect of hydrocephalus  BUN/Cr 18/1.40  Resolved problem list   Assessment and Plan  Subarachnoid Hemorrhage due to Left ICA Aneurysm  H Hunt Score 3 HTN hx  CT head 12/6 Stable  SAH P: NSY/Neuro IR following. ASA per surgical team for potential risk of clot  Maintain neuroprotective measures: goal for eurothermia, euglycemia, eunatermia, normoxia, and PCO2 goal of 35-40 Cleviprex  for goal 120-160. Currently off but may need to restart once sedation weaned Restarted hom Bystolic  Start amlodipine  10 mg daily Nutrition and bowel regiment  Seizure precautions  Aspirations precautions  Empiric Keppra  and Miotop per NSGY STAT imaging for any acute neurochanges Avoid narcotics as able, utilize dexamethasone  for pain As needed Zofran  for nausea  VDRF Tolerating SBT. Likely able to extubate today Full vent support LTVV, 4-8cc/kg IBW with goal Pplat<30 and DP<15 PAD: Wean off sedation  Sleep Apnea hx Probably in setting of obesity  P: Consider BiPAP post-extubation Will need outpatient sleep study  Fibromyalgia  P: Hold pain and sedative medications at this time Supportive care   Anemia  Hgb stable, as well as plts P: Send anemia panel morning labs  Prediabetes  P: Check hgb A1c  SSI  CBG q 4   Depression P: Supportive care   Labs   CBC: Recent Labs  Lab 09/27/24 0630  WBC 10.5  NEUTROABS 6.6  HGB 13.7  HCT 42.8  MCV 85.4  PLT 266    Basic Metabolic Panel: Recent Labs  Lab 09/27/24 0630 09/28/24 0441  NA 143 140  K 3.7 4.1  CL 104 101  CO2 28 26  GLUCOSE 161* 129*  BUN 16 18  CREATININE 1.01* 1.40*  CALCIUM 9.7 7.9*  MG  --  2.1  PHOS  --  6.5*   GFR: Estimated Creatinine Clearance: 89.5 mL/min (A) (by C-G formula based on SCr of 1.4 mg/dL (H)). Recent Labs  Lab 09/27/24 0630  WBC 10.5    Liver Function Tests: Recent Labs  Lab 09/27/24 0630  AST 25  ALT 29  ALKPHOS 92  BILITOT 0.4  PROT 8.2*  ALBUMIN 4.3   No results for input(s): LIPASE, AMYLASE in the last 168 hours. No results for input(s): AMMONIA in the last 168 hours.  ABG No results found for: PHART, PCO2ART, PO2ART, HCO3, TCO2,  ACIDBASEDEF, O2SAT   Coagulation Profile: Recent Labs  Lab 09/27/24 0630  INR 0.9    Cardiac Enzymes: No results for input(s): CKTOTAL, CKMB, CKMBINDEX, TROPONINI in the last 168 hours.  HbA1C: Hgb A1c MFr Bld  Date/Time Value Ref Range Status  07/24/2023 11:26 AM 6.1 (H) 4.8 - 5.6 % Final    Comment:             Prediabetes: 5.7 - 6.4          Diabetes: >6.4          Glycemic control for adults with diabetes: <7.0   11/22/2022 10:16 AM 6.1 (H) 4.8 - 5.6 % Final    Comment:             Prediabetes: 5.7 - 6.4          Diabetes: >6.4          Glycemic control for adults with diabetes: <7.0     CBG:  Recent Labs  Lab 09/27/24 1955 09/27/24 2328 09/28/24 0322  GLUCAP 168* 156* 124*    Review of Systems:   Review of Systems  Gastrointestinal:  Positive for nausea.  Neurological:  Positive for headaches.     Past Medical History:  She,  has a past medical history of Anemia, Back pain, Depression, Fibromyalgia, Hypertension, Infertility, female, Joint pain, Osteoarthritis, Osteoarthritis, Prediabetes, Sleep apnea, SOBOE (shortness of breath on exertion), Swelling of both lower extremities, and Vitamin D  deficiency.   Surgical History:   Past Surgical History:  Procedure Laterality Date   IR INJECT/THERA/INC NEEDLE/CATH/PLC EPI/LUMB/SAC W/IMG  08/05/2021     Social History:   reports that she has never smoked. She has never used smokeless tobacco. She reports current alcohol use.   Family History:  Her family history includes Diabetes in her mother; Heart disease in her father and mother; Hypertension in her mother; Kidney disease in her father and mother; Obesity in her mother; Sleep apnea in her mother. There is no history of Breast cancer.   Allergies Allergies  Allergen Reactions   Metformin  And Related Nausea Only     Home Medications  Prior to Admission medications   Medication Sig Start Date End Date Taking? Authorizing Provider   buPROPion  (WELLBUTRIN  XL) 150 MG 24 hr tablet Take 1 tablet (150 mg total) by mouth daily. 04/30/24   Francyne Romano, MD  CAPSAICIN HEAT PATCH EX Apply topically.    [provider]  Cholecalciferol (VITAMIN D3) 50 MCG (2000 UT) capsule Take 1 capsule (2,000 Units total) by mouth daily. Patient not taking: Reported on 08/05/2024 10/24/23   Francyne Romano, MD  Cyanocobalamin (B-12 COMPLIANCE INJECTION) 1000 MCG/ML KIT Inject as directed.    [provider]  cyclobenzaprine  (FLEXERIL ) 10 MG tablet Take 1 tablet (10 mg total) by mouth 2 (two) times daily as needed for muscle spasms. 02/26/24   Small, Brooke L, PA  ergocalciferol  (VITAMIN D2) 1.25 MG (50000 UT) capsule Take 1 capsule (50,000 Units total) by mouth once a week. 07/24/23   Francyne Romano, MD  losartan  (COZAAR ) 50 MG tablet Take 50 mg by mouth daily.    [provider]  Multiple Vitamin (MULTIVITAMIN ADULT PO) Take by mouth.    [provider]  nebivolol  (BYSTOLIC ) 2.5 MG tablet Take 1 tablet (2.5 mg total) by mouth daily. 03/21/24   Francyne Romano, MD     Critical care time: 21 mins     Slater Staff, M.D. [facility] Pulmonary/Critical Care Medicine 09/28/2024 8:22 AM   See Amion for personal pager For hours between 7 PM to 7 AM, please call Elink for urgent questions

## 2024-09-28 NOTE — Progress Notes (Signed)
 Pt transported on ventilator to CT and back w/o complication.

## 2024-09-28 NOTE — Progress Notes (Signed)
 eLink Physician-Brief Progress Note Patient Name: Ashlee Gordon DOB: [birth year] MRN: [id number]   Date of Service  09/28/2024  HPI/Events of Note  Significant discrepancy between arterial line and BP cuff, A-line dysfunction  eICU Interventions  Discontinue arterial line, utilize cuff     Intervention Category Minor Interventions: Routine modifications to care plan (e.g. PRN medications for pain, fever)  Stephone Gum 09/28/2024, 9:37 PM

## 2024-09-29 ENCOUNTER — Inpatient Hospital Stay (HOSPITAL_COMMUNITY)

## 2024-09-29 DIAGNOSIS — I609 Nontraumatic subarachnoid hemorrhage, unspecified: Secondary | ICD-10-CM

## 2024-09-29 LAB — BASIC METABOLIC PANEL WITH GFR
Anion gap: 9 (ref 5–15)
BUN: 18 mg/dL (ref 6–20)
CO2: 26 mmol/L (ref 22–32)
Calcium: 8 mg/dL — ABNORMAL LOW (ref 8.9–10.3)
Chloride: 104 mmol/L (ref 98–111)
Creatinine, Ser: 1.07 mg/dL — ABNORMAL HIGH (ref 0.44–1.00)
GFR, Estimated: >60 mL/min (ref >60)
Glucose, Bld: 128 mg/dL — ABNORMAL HIGH (ref 70–99)
Potassium: 4.1 mmol/L (ref 3.5–5.1)
Sodium: 139 mmol/L (ref 135–145)

## 2024-09-29 LAB — CBC
HCT: 40.5 % (ref 36.0–46.0)
Hemoglobin: 12.6 g/dL (ref 12.0–15.0)
MCH: 26.8 pg (ref 26.0–34.0)
MCHC: 31.1 g/dL (ref 30.0–36.0)
MCV: 86 fL (ref 80.0–100.0)
Platelets: 250 K/uL (ref 150–400)
RBC: 4.71 MIL/uL (ref 3.87–5.11)
RDW: 15 % (ref 11.5–15.5)
WBC: 13.4 K/uL — ABNORMAL HIGH (ref 4.0–10.5)
nRBC: 0 % (ref 0.0–0.2)

## 2024-09-29 LAB — LIPID PANEL
Cholesterol: 150 mg/dL (ref 0–200)
HDL: 44 mg/dL (ref >40)
LDL Cholesterol: 76 mg/dL (ref 0–99)
Total CHOL/HDL Ratio: 3.4 ratio
Triglycerides: 150 mg/dL — ABNORMAL HIGH (ref <150)
VLDL: 30 mg/dL (ref 0–40)

## 2024-09-29 LAB — GLUCOSE, CAPILLARY
Glucose-Capillary: 133 mg/dL — ABNORMAL HIGH (ref 70–99)
Glucose-Capillary: 149 mg/dL — ABNORMAL HIGH (ref 70–99)
Glucose-Capillary: 119 mg/dL — ABNORMAL HIGH (ref 70–99)
Glucose-Capillary: 128 mg/dL — ABNORMAL HIGH (ref 70–99)
Glucose-Capillary: 193 mg/dL — ABNORMAL HIGH (ref 70–99)
Glucose-Capillary: 138 mg/dL — ABNORMAL HIGH (ref 70–99)

## 2024-09-29 MED ORDER — LABETALOL HCL 5 MG/ML IV SOLN
10.0000 mg | Freq: Four times a day (QID) | INTRAVENOUS | Status: DC | PRN
  Administered 2024-09-29: 10 mg via INTRAVENOUS
  Filled 2024-09-29: qty 4

## 2024-09-29 MED ORDER — LABETALOL HCL 5 MG/ML IV SOLN
10.0000 mg | Freq: Four times a day (QID) | INTRAVENOUS | Status: DC | PRN
  Administered 2024-09-30 – 2024-10-01 (×2): 10 mg via INTRAVENOUS
  Filled 2024-09-29 (×2): qty 4

## 2024-09-29 MED ORDER — LEVETIRACETAM (KEPPRA) 500 MG/5 ML ADULT IV PUSH
500.0000 mg | Freq: Two times a day (BID) | INTRAVENOUS | Status: DC
  Administered 2024-09-29: 500 mg via INTRAVENOUS
  Filled 2024-09-29 (×2): qty 5

## 2024-09-29 MED ORDER — DEXAMETHASONE 4 MG PO TABS
4.0000 mg | ORAL_TABLET | Freq: Four times a day (QID) | ORAL | Status: DC | PRN
  Administered 2024-09-29 – 2024-09-30 (×3): 4 mg via ORAL
  Filled 2024-09-29 (×3): qty 1

## 2024-09-29 MED ORDER — LOSARTAN POTASSIUM 50 MG PO TABS
25.0000 mg | ORAL_TABLET | Freq: Every day | ORAL | Status: DC
  Administered 2024-09-29: 25 mg via ORAL
  Filled 2024-09-29: qty 1

## 2024-09-29 NOTE — Evaluation (Signed)
 Physical Therapy Evaluation Patient Details Name: Ashlee Gordon MRN: [id number] DOB: [birth year] Today's Date: 09/29/2024  History of Present Illness  53 yo female admitted 12/5 with sharp HA and emesis at [facility] (+) [facility] transfer to [facility]  CT angiogram Large aneurysm; 12/5 intubated for coiling embolization procedure; extubated 12/6; PMH HTN, morbid obesity, prediabetes, depression, fibromyalgia, OA  Clinical Impression   Pt admitted secondary to problem above with deficits below. PTA patient lives in a mobile home with 5 steps with rail to enter. Her sister lives with her. She was independent PTA (reports on disability). Pt currently required CGA for supine to sit with HOB elevated; independent with transfer to chair; and ambulation deferred due to reported incr headache and BP 166/105 immediately after sitting. Anticipate pt will progress quickly and have no PT needs on discharge.  Anticipate patient will benefit from PT to address problems listed below. Will continue to follow acutely to maximize functional mobility, independence, and safety.           If plan is discharge home, recommend the following: Assist for transportation;Help with stairs or ramp for entrance   Can travel by private vehicle        Equipment Recommendations None recommended by PT  Recommendations for Other Services  OT consult    Functional Status Assessment Patient has had a recent decline in their functional status and demonstrates the ability to make significant improvements in function in a reasonable and predictable amount of time.     Precautions / Restrictions Precautions Precautions: Other (comment) Recall of Precautions/Restrictions: Intact Precaution/Restrictions Comments: SBP 100-180 per RN      Mobility  Bed Mobility Overal bed mobility: Needs Assistance Bed Mobility: Supine to Sit     Supine to sit: Contact guard, HOB elevated     General bed mobility comments: very close to EOB  therefore close guarding for safety    Transfers Overall transfer level: Independent Equipment used: None               General transfer comment: Stood from ICU bed at lowest height; step-pivot to recliner    Ambulation/Gait               General Gait Details: deferred due to incr headache with sit to stand; BP 166/105  Stairs            Wheelchair Mobility     Tilt Bed    Modified Rankin (Stroke Patients Only) Modified Rankin (Stroke Patients Only) Pre-Morbid Rankin Score: Slight disability Modified Rankin: Slight disability     Balance Overall balance assessment: No apparent balance deficits (not formally assessed)                                           Pertinent Vitals/Pain Pain Assessment Pain Assessment: 0-10 Pain Score: 7  Pain Location: head Pain Descriptors / Indicators: Headache Pain Intervention(s): Premedicated before session, Monitored during session, Limited activity within patient's tolerance    Home Living Family/patient expects to be discharged to:: Private residence Living Arrangements: Other relatives (sister) Available Help at Discharge: Family;Available 24 hours/day Type of Home: Mobile home Home Access: Stairs to enter Entrance Stairs-Rails: Right Entrance Stairs-Number of Steps: 5   Home Layout: One level Home Equipment: None      Prior Function Prior Level of Function : Independent/Modified Independent;Driving;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; disabled)  Extremity/Trunk Assessment   Upper Extremity Assessment Upper Extremity Assessment: Defer to OT evaluation    Lower Extremity Assessment Lower Extremity Assessment: Overall WFL for tasks assessed (toe extension and DF 5/5; denies numbness)    Cervical / Trunk Assessment Cervical / Trunk Assessment: Other exceptions Cervical / Trunk Exceptions: morbid obesity  Communication   Communication Communication:  No apparent difficulties    Cognition Arousal: Lethargic Behavior During Therapy: Flat affect                             Following commands: Impaired Following commands impaired: Follows one step commands with increased time (? delays due to pain?)     Cueing Cueing Techniques: Verbal cues     General Comments General comments (skin integrity, edema, etc.): BP supine 116/66; after transfer to chair 166/105 with RN made aware (due to DBP)    Exercises     Assessment/Plan    PT Assessment Patient needs continued PT services  PT Problem List Decreased activity tolerance;Decreased mobility;Decreased knowledge of use of DME;Obesity;Pain       PT Treatment Interventions DME instruction;Gait training;Stair training;Functional mobility training;Therapeutic activities;Patient/family education    PT Goals (Current goals can be found in the Care Plan section)  Acute Rehab PT Goals Patient Stated Goal: headache to go away PT Goal Formulation: With patient Time For Goal Achievement: 10/13/24 Potential to Achieve Goals: Good    Frequency Min 2X/week     Co-evaluation               AM-PAC PT 6 Clicks Mobility  Outcome Measure Help needed turning from your back to your side while in a flat bed without using bedrails?: None Help needed moving from lying on your back to sitting on the side of a flat bed without using bedrails?: None Help needed moving to and from a bed to a chair (including a wheelchair)?: None Help needed standing up from a chair using your arms (e.g., wheelchair or bedside chair)?: None Help needed to walk in hospital room?: Total (<20 ft) Help needed climbing 3-5 steps with a railing? : Total 6 Click Score: 18    End of Session   Activity Tolerance: Patient limited by pain Patient left: in chair;with call bell/phone within reach Nurse Communication: Mobility status;Other (comment) (elevated DBP) PT Visit Diagnosis: Difficulty in walking,  not elsewhere classified (R26.2);Other symptoms and signs involving the nervous system (R29.898)    Time: 9251-9181 PT Time Calculation (min) (ACUTE ONLY): 30 min   Charges:   PT Evaluation $PT Eval Low Complexity: 1 Low PT Treatments $Therapeutic Activity: 8-22 mins PT General Charges $$ ACUTE PT VISIT: 1 Visit          Macario RAMAN, PT Acute Rehabilitation Services  Office [phone]   Macario SHAUNNA Soja 09/29/2024, 8:33 AM

## 2024-09-29 NOTE — Progress Notes (Signed)
 Transcranial Doppler   Date POD PCO2 HCT BP   MCA ACA PCA OPHT SIPH VERT Basilar  12/5 GC         Right  Left   86  74   *  -33   47  *   23  13   19  23    -17  -25   -15        12/6 CK 1        Right  Left   54   55    -27   -37    24   28    14   24     *   21   *    *    *                 Right  Left                                                                 Right  Left                                                                 Right  Left                                                               Right  Left                                                               Right  Left                                                       MCA = Middle Cerebral Artery      OPHT = Opthalmic Artery     BASILAR = Basilar Artery   ACA = Anterior Cerebral Artery     SIPH = Carotid Siphon PCA = Posterior Cerebral Artery   VERT = Verterbral Artery                    Normal MCA = 62+\-12 ACA = 50+\-12 PCA = 42+\-23     * Unable to insonate [place] Ratio R: *  L: 2.12

## 2024-09-29 NOTE — Progress Notes (Signed)
 NAME:  Ashlee Gordon, MRN:  [id number], DOB:  01-27-1971, LOS: 2 ADMISSION DATE:  09/27/2024, CONSULTATION DATE:  12/5  REFERRING MD: Pamella CHIEF COMPLAINT:  Severe Headache   History of Present Illness:  Patient is a 53 year old female with significant past medical history of sleep apnea, hypertension, obesity, prediabetes, depression, fibromyalgia, anemia, and osteoarthritis who [facility] ED after a sudden onset of a severe headache approximately around 0 530 on 12/5.  Per report, severe headache occurred while her sister and boyfriend were arguing at the residence.  Patient was immediately transported to CT scan-for stat head showing concerns of a acute subarachnoid hemorrhage, aneurysmal rupture, mild ventriculomegaly, no intraventricular hemorrhage and no midline shift.  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  Neurovascular was consulted and requested patient be transferred to [facility], ED for further neuro eval and and possible IR intervention.  Patient was started on Cleviprex  due to hypertension.  Upon arrival, patient is nauseous and also drowsy but not having no focal neuro deficits. PCCM consulted for admission to 4N ICU after endovascular intervention- aneurysm coiling and possible EVD placement.   Upon ED assessment, patient drowsy, still complaining of headache. Patient able to state name, and oriented to place but not able to state time. Patient has no focal neuro deficits at this time, able to move all extremities but does have generalized weakness. Patient on cleviprex  at this time with ongoing titration.   Pertinent  Medical History   Past Medical History:  Diagnosis Date   Anemia    Back pain    Depression    Fibromyalgia    Hypertension    Infertility, female    Joint pain    Osteoarthritis    Osteoarthritis    Prediabetes    Sleep apnea    SOBOE  (shortness of breath on exertion)    Swelling of both lower extremities    Vitamin D  deficiency      Significant Hospital Events: Including procedures, antibiotic start and stop dates in addition to other pertinent events   12/5 PCCM admit- SAH due to Left ICA aneurysm, plan for coiling by NIR, eval for EVD 12/6 Extubated  Interim History / Subjective:  Extubated yesterday Afebrile Overnight A line discontinued due to discrepancy in cuff and A-line Continuing to report headaches  Objective    Blood pressure (!) 160/102, pulse 76, temperature 98.5 F (36.9 C), temperature source Axillary, resp. rate 16, height 5' 7.99 (1.727 m), weight (!) 205.6 kg, last menstrual period 10/22/2020, SpO2 93%.        Intake/Output Summary (Last 24 hours) at 09/29/2024 0830 Last data filed at 09/29/2024 0700 Gross per 24 hour  Intake 444.19 ml  Output 865 ml  Net -420.81 ml   Filed Weights   09/27/24 1345 09/28/24 0315  Weight: (!) 200 kg (!) 205.6 kg   Physical Exam: General: Chronically ill-appearing, drowsy but easily awakened HENT: [place], AT, OP clear, MMM Eyes: EOMI, no scleral icterus Respiratory: Clear to auscultation bilaterally.  No crackles, wheezing or rales Cardiovascular: RRR, -M/R/G, no JVD Extremities:-Edema,-tenderness Neuro: Drowsy but AAO x4, CNII-XII grossly intact, strength 4+/5 in all extremities GU: External foley in place  Imaging, labs and test in EMR in the last 24 hours reviewed independently by me. Pertinent findings below:  CT head 09/28/24 - S/p coiling of left ICA aneurysm, Similar SAH, nod midline shift/mass effect of  hydrocephalus  Improved BUN/Cr 18/1.07 GFR >60 WBC 13.4 increased  Resolved problem list   Assessment and Plan  Subarachnoid Hemorrhage due to Left ICA Aneurysm  H Hunt Score 3 HTN hx  CT head 12/6 Stable SAH P: NSY/Neuro IR following. ASA per surgical team for potential risk for clot. Repeat CT head tomorrow am. Neuroprotective  measures: HOB > 30 degrees, normoglycemia, normothermia, electrolytes WNL Off cleviprex . Will need to replace A-line for SBP goal 100-180 On coreg  inpatient. On bystolic  at home Started amlodipine  10 mg daily 12/6 Daily TCDs per NSY.  Nutrition and bowel regiment  Seizure precautions  Aspirations precautions  Empiric keppra  and nimotop  per NSGY STAT imaging for any acute neurochanges Avoid narcotics as able, utilize dexamethasone  for refractory pain As needed Zofran  for nausea  Headache Persistent despite tylenol  and low dose dilaudid  Limited trial on IV narcotics x 24 hours PRN low dose oxycodone . Do not increase If refractory HA, consided dexamethasone  4 mg q6h PRN  VDRF - resolved Extubated 12/6  Sleep Apnea hx Probably in setting of obesity  P: Consider BiPAP post-extubation Will need outpatient sleep study  Fibromyalgia  P: Hold pain and sedative medications at this time Supportive care   Anemia  Hgb stable, as well as plts P: Trend  Prediabetes  P: Check hgb A1c  SSI  CBG q 4   Depression P: Supportive care   Labs   CBC: Recent Labs  Lab 09/27/24 0630 09/29/24 0444  WBC 10.5 13.4*  NEUTROABS 6.6  --   HGB 13.7 12.6  HCT 42.8 40.5  MCV 85.4 86.0  PLT 266 250    Basic Metabolic Panel: Recent Labs  Lab 09/27/24 0630 09/28/24 0441 09/29/24 0444  NA 143 140 139  K 3.7 4.1 4.1  CL 104 101 104  CO2 28 26 26   GLUCOSE 161* 129* 128*  BUN 16 18 18   CREATININE 1.01* 1.40* 1.07*  CALCIUM 9.7 7.9* 8.0*  MG  --  2.1  --   PHOS  --  6.5*  --    GFR: Estimated Creatinine Clearance: 117.1 mL/min (A) (by C-G formula based on SCr of 1.07 mg/dL (H)). Recent Labs  Lab 09/27/24 0630 09/29/24 0444  WBC 10.5 13.4*    Liver Function Tests: Recent Labs  Lab 09/27/24 0630  AST 25  ALT 29  ALKPHOS 92  BILITOT 0.4  PROT 8.2*  ALBUMIN 4.3   No results for input(s): LIPASE, AMYLASE in the last 168 hours. No results for input(s): AMMONIA  in the last 168 hours.  ABG No results found for: PHART, PCO2ART, PO2ART, HCO3, TCO2, ACIDBASEDEF, O2SAT   Coagulation Profile: Recent Labs  Lab 09/27/24 0630  INR 0.9    Cardiac Enzymes: No results for input(s): CKTOTAL, CKMB, CKMBINDEX, TROPONINI in the last 168 hours.  HbA1C: Hgb A1c MFr Bld  Date/Time Value Ref Range Status  07/24/2023 11:26 AM 6.1 (H) 4.8 - 5.6 % Final    Comment:             Prediabetes: 5.7 - 6.4          Diabetes: >6.4          Glycemic control for adults with diabetes: <7.0   11/22/2022 10:16 AM 6.1 (H) 4.8 - 5.6 % Final    Comment:             Prediabetes: 5.7 - 6.4          Diabetes: >6.4  Glycemic control for adults with diabetes: <7.0     CBG: Recent Labs  Lab 09/28/24 1506 09/28/24 1935 09/28/24 2328 09/29/24 0421 09/29/24 0721  GLUCAP 115* 123* 122* 133* 149*    Review of Systems:   Review of Systems  Constitutional:  Negative for chills, diaphoresis, fever, malaise/fatigue and weight loss.  HENT:  Negative for congestion.   Respiratory:  Negative for cough, hemoptysis, sputum production, shortness of breath and wheezing.   Cardiovascular:  Negative for chest pain, palpitations and leg swelling.  Neurological:  Positive for headaches.     Past Medical History:  She,  has a past medical history of Anemia, Back pain, Depression, Fibromyalgia, Hypertension, Infertility, female, Joint pain, Osteoarthritis, Osteoarthritis, Prediabetes, Sleep apnea, SOBOE (shortness of breath on exertion), Swelling of both lower extremities, and Vitamin D  deficiency.   Surgical History:   Past Surgical History:  Procedure Laterality Date   IR INJECT/THERA/INC NEEDLE/CATH/PLC EPI/LUMB/SAC W/IMG  08/05/2021     Social History:   reports that she has never smoked. She has never used smokeless tobacco. She reports current alcohol use.   Family History:  Her family history includes Diabetes in her mother; Heart  disease in her father and mother; Hypertension in her mother; Kidney disease in her father and mother; Obesity in her mother; Sleep apnea in her mother. There is no history of Breast cancer.   Allergies Allergies  Allergen Reactions   Metformin  And Related Nausea Only     Home Medications  Prior to Admission medications   Medication Sig Start Date End Date Taking? Authorizing Provider  buPROPion  (WELLBUTRIN  XL) 150 MG 24 hr tablet Take 1 tablet (150 mg total) by mouth daily. 04/30/24   Francyne Romano, MD  CAPSAICIN HEAT PATCH EX Apply topically.    [provider]  Cholecalciferol (VITAMIN D3) 50 MCG (2000 UT) capsule Take 1 capsule (2,000 Units total) by mouth daily. Patient not taking: Reported on 08/05/2024 10/24/23   Francyne Romano, MD  Cyanocobalamin (B-12 COMPLIANCE INJECTION) 1000 MCG/ML KIT Inject as directed.    [provider]  cyclobenzaprine  (FLEXERIL ) 10 MG tablet Take 1 tablet (10 mg total) by mouth 2 (two) times daily as needed for muscle spasms. 02/26/24   Small, Brooke L, PA  ergocalciferol  (VITAMIN D2) 1.25 MG (50000 UT) capsule Take 1 capsule (50,000 Units total) by mouth once a week. 07/24/23   Francyne Romano, MD  losartan  (COZAAR ) 50 MG tablet Take 50 mg by mouth daily.    [provider]  Multiple Vitamin (MULTIVITAMIN ADULT PO) Take by mouth.    [provider]  nebivolol  (BYSTOLIC ) 2.5 MG tablet Take 1 tablet (2.5 mg total) by mouth daily. 03/21/24   Francyne Romano, MD     Critical care time: 73 mins     Slater Staff, M.D. [facility] Pulmonary/Critical Care Medicine 09/29/2024 8:30 AM   See Amion for personal pager For hours between 7 PM to 7 AM, please call Elink for urgent questions

## 2024-09-29 NOTE — Progress Notes (Signed)
 eLink Physician-Brief Progress Note Patient Name: Ashlee Gordon DOB: [birth year] MRN: [id number]   Date of Service  09/29/2024  HPI/Events of Note  Notified that art line came out. RT attempted and unable to place back. Sign out mentions cuff pressures have been inaccurate due to BMI. Is needing Cleviprex .   eICU Interventions  Notified ground team to replace art line. Two lines placed during the day were US  guided radial lines which have both fallen off.      Intervention Category Major Interventions: Hypertension - evaluation and management  Serine Kea G Vollie Brunty 09/29/2024, 9:54 PM

## 2024-09-29 NOTE — Procedures (Signed)
 Arterial Catheter Insertion Procedure Note  Ashlee Gordon  [id number]  [birth year]  Date:09/29/24  Time:11:26 AM    Provider Performing: Khya Halls Slater Staff, MD   Procedure: Insertion of Arterial Line (63379) with US  guidance (23062)   Indication(s) Blood pressure monitoring and/or need for frequent ABGs  Consent Risks of the procedure as well as the alternatives and risks of each were explained to the patient and/or caregiver.  Consent for the procedure was obtained and is signed in the bedside chart  Anesthesia None   Time Out Verified patient identification, verified procedure, site/side was marked, verified correct patient position, special equipment/implants available, medications/allergies/relevant history reviewed, required imaging and test results available.   Sterile Technique Maximal sterile technique including full sterile barrier drape, hand hygiene, sterile gown, sterile gloves, mask, hair covering, sterile ultrasound probe cover (if used).   Procedure Description Area of catheter insertion was cleaned with chlorhexidine  and draped in sterile fashion. With real-time ultrasound guidance an arterial catheter was placed into the right radial artery.  Appropriate arterial tracings confirmed on monitor.     Complications/Tolerance None; patient tolerated the procedure well.   EBL Minimal   Specimen(s) None

## 2024-09-29 NOTE — Procedures (Addendum)
 Arterial Catheter Insertion Procedure Note  Ashlee Gordon  [id number]  1971/06/22  Date:09/29/24  Time:5:47 PM    Provider Performing: Nakeia Calvi Slater Staff MD   Procedure: Insertion of Arterial Line (63379) with US  guidance (23062)   Indication(s) Blood pressure monitoring and/or need for frequent ABGs Right arterial A-line no longer transmitting  Consent Risks of the procedure as well as the alternatives and risks of each were explained to the patient and/or caregiver.  Consent for the procedure was obtained and is signed in the bedside chart  Anesthesia None   Time Out Verified patient identification, verified procedure, site/side was marked, verified correct patient position, special equipment/implants available, medications/allergies/relevant history reviewed, required imaging and test results available.   Sterile Technique Maximal sterile technique including full sterile barrier drape, hand hygiene, sterile gown, sterile gloves, mask, hair covering, sterile ultrasound probe cover (if used).   Procedure Description Area of catheter insertion was cleaned with chlorhexidine  and draped in sterile fashion. With real-time ultrasound guidance an arterial catheter was placed into the left radial artery.  Appropriate arterial tracings confirmed on monitor.     Complications/Tolerance None; patient tolerated the procedure well.   EBL Minimal   Specimen(s) None

## 2024-09-29 NOTE — Progress Notes (Signed)
 Cross covering ICU physician  Called to assess need for BP. Pt has had numerous with multiple issues and ultimately all d/c'd. Concern for ability for cuff pressures. Pt is alert oriented and in no distress, on cleviprex  but with such variability in pressures unsure what is accurate. Body habitus not conducive for femoral in light of very adequate radial artery access when examined under u/s so will do radial a line with u/s guidance. D/w pt who is agreement, would prefer L arm as R handed pt.   RN at bedside present for discussion and has no objections at this time.

## 2024-09-29 NOTE — Anesthesia Postprocedure Evaluation (Addendum)
 Anesthesia Post Note  Patient: Ashlee Gordon  Procedure(s) Performed: RADIOLOGY WITH ANESTHESIA     Patient location during evaluation: PACU Anesthesia Type: General Level of consciousness: sedated Pain management: pain level controlled Vital Signs Assessment: post-procedure vital signs reviewed and stable Respiratory status: patient remains intubated per anesthesia plan Cardiovascular status: blood pressure returned to baseline and stable Postop Assessment: no apparent nausea or vomiting Anesthetic complications: no   No notable events documented.  Last Vitals:  Vitals:   09/29/24 0800 09/29/24 0900  BP: (!) 160/102 131/68  Pulse: 76 75  Resp: 16 (!) 27  Temp: 36.9 C   SpO2: 93% 96%    Last Pain:  Vitals:   09/29/24 1124  TempSrc:   PainSc: 8    Pain Goal: Patients Stated Pain Goal: 0 (09/29/24 0800)                 Jessika Rothery

## 2024-09-29 NOTE — Procedures (Signed)
 Arterial Catheter Insertion Procedure Note  Ashlee Gordon  [id number]  10-30-1970  Date:09/29/24  Time:11:19 PM    Provider Performing: Harlene Na    Procedure: Insertion of Arterial Line (63379) with US  guidance (23062)   Indication(s) Blood pressure monitoring and/or need for frequent ABGs  Consent Risks of the procedure as well as the alternatives and risks of each were explained to the patient and/or caregiver.  Consent for the procedure was obtained and is signed in the bedside chart  Anesthesia See mar   Time Out Verified patient identification, verified procedure, site/side was marked, verified correct patient position, special equipment/implants available, medications/allergies/relevant history reviewed, required imaging and test results available.   Sterile Technique Maximal sterile technique including full sterile barrier drape, hand hygiene, sterile gown, sterile gloves, mask, hair covering, sterile ultrasound probe cover (if used).   Procedure Description Area of catheter insertion was cleaned with chlorhexidine  and draped in sterile fashion. With real-time ultrasound guidance an arterial catheter was placed into the left radial artery.  Appropriate arterial tracings confirmed on monitor.     Complications/Tolerance None; patient tolerated the procedure well.   EBL Minimal   Specimen(s) None

## 2024-09-30 ENCOUNTER — Inpatient Hospital Stay (HOSPITAL_COMMUNITY)

## 2024-09-30 DIAGNOSIS — I609 Nontraumatic subarachnoid hemorrhage, unspecified: Secondary | ICD-10-CM

## 2024-09-30 LAB — CBC
HCT: 38.4 % (ref 36.0–46.0)
Hemoglobin: 12.3 g/dL (ref 12.0–15.0)
MCH: 27.2 pg (ref 26.0–34.0)
MCHC: 32 g/dL (ref 30.0–36.0)
MCV: 85 fL (ref 80.0–100.0)
Platelets: 254 K/uL (ref 150–400)
RBC: 4.52 MIL/uL (ref 3.87–5.11)
RDW: 14.7 % (ref 11.5–15.5)
WBC: 14.6 K/uL — ABNORMAL HIGH (ref 4.0–10.5)
nRBC: 0 % (ref 0.0–0.2)

## 2024-09-30 LAB — BASIC METABOLIC PANEL WITH GFR
Anion gap: 9 (ref 5–15)
Anion gap: 8 (ref 5–15)
BUN: 13 mg/dL (ref 6–20)
BUN: 13 mg/dL (ref 6–20)
CO2: 27 mmol/L (ref 22–32)
CO2: 28 mmol/L (ref 22–32)
Calcium: 8.3 mg/dL — ABNORMAL LOW (ref 8.9–10.3)
Calcium: 8.2 mg/dL — ABNORMAL LOW (ref 8.9–10.3)
Chloride: 107 mmol/L (ref 98–111)
Chloride: 106 mmol/L (ref 98–111)
Creatinine, Ser: 0.81 mg/dL (ref 0.44–1.00)
Creatinine, Ser: 0.97 mg/dL (ref 0.44–1.00)
GFR, Estimated: >60 mL/min (ref >60)
GFR, Estimated: >60 mL/min (ref >60)
Glucose, Bld: 123 mg/dL — ABNORMAL HIGH (ref 70–99)
Glucose, Bld: 139 mg/dL — ABNORMAL HIGH (ref 70–99)
Potassium: 3.8 mmol/L (ref 3.5–5.1)
Potassium: 3.8 mmol/L (ref 3.5–5.1)
Sodium: 143 mmol/L (ref 135–145)
Sodium: 142 mmol/L (ref 135–145)

## 2024-09-30 LAB — HEMOGLOBIN A1C
Hgb A1c MFr Bld: 6.1 % — ABNORMAL HIGH (ref 4.8–5.6)
Mean Plasma Glucose: 128.37 mg/dL

## 2024-09-30 LAB — GLUCOSE, CAPILLARY
Glucose-Capillary: 148 mg/dL — ABNORMAL HIGH (ref 70–99)
Glucose-Capillary: 122 mg/dL — ABNORMAL HIGH (ref 70–99)
Glucose-Capillary: 147 mg/dL — ABNORMAL HIGH (ref 70–99)
Glucose-Capillary: 125 mg/dL — ABNORMAL HIGH (ref 70–99)
Glucose-Capillary: 147 mg/dL — ABNORMAL HIGH (ref 70–99)
Glucose-Capillary: 120 mg/dL — ABNORMAL HIGH (ref 70–99)

## 2024-09-30 LAB — POCT ACTIVATED CLOTTING TIME
Activated Clotting Time: 179 s
Activated Clotting Time: 189 s

## 2024-09-30 LAB — MAGNESIUM: Magnesium: 2.2 mg/dL (ref 1.7–2.4)

## 2024-09-30 LAB — HIV ANTIBODY (ROUTINE TESTING W REFLEX): HIV Screen 4th Generation wRfx: NONREACTIVE

## 2024-09-30 LAB — OSMOLALITY: Osmolality: 300 mosm/kg — ABNORMAL HIGH (ref 275–295)

## 2024-09-30 MED ORDER — HYDRALAZINE HCL 25 MG PO TABS
25.0000 mg | ORAL_TABLET | Freq: Three times a day (TID) | ORAL | Status: DC
  Administered 2024-09-30 – 2024-10-01 (×3): 25 mg via ORAL
  Filled 2024-09-30 (×3): qty 1

## 2024-09-30 MED ORDER — BUTALBITAL-APAP-CAFFEINE 50-325-40 MG PO TABS
2.0000 | ORAL_TABLET | Freq: Four times a day (QID) | ORAL | Status: DC | PRN
  Administered 2024-09-30: 2 via ORAL
  Filled 2024-09-30: qty 2

## 2024-09-30 MED ORDER — CLEVIDIPINE BUTYRATE 0.5 MG/ML IV EMUL
INTRAVENOUS | Status: AC
  Filled 2024-09-30: qty 100

## 2024-09-30 MED ORDER — ACETAMINOPHEN 650 MG RE SUPP: 650.0000 mg | RECTAL | Status: DC | PRN

## 2024-09-30 MED ORDER — LOSARTAN POTASSIUM 50 MG PO TABS
50.0000 mg | ORAL_TABLET | Freq: Every day | ORAL | Status: DC
  Administered 2024-09-30 – 2024-10-12 (×13): 50 mg via ORAL
  Filled 2024-09-30 (×13): qty 1

## 2024-09-30 MED ORDER — BUTALBITAL-APAP-CAFFEINE 50-325-40 MG PO TABS
2.0000 | ORAL_TABLET | ORAL | Status: DC | PRN
  Administered 2024-09-30 – 2024-10-01 (×4): 2 via ORAL
  Filled 2024-09-30 (×4): qty 2

## 2024-09-30 MED ORDER — BUPROPION HCL ER (XL) 150 MG PO TB24
150.0000 mg | ORAL_TABLET | Freq: Every day | ORAL | Status: DC
  Administered 2024-10-01 – 2024-10-12 (×11): 150 mg via ORAL
  Filled 2024-09-30 (×13): qty 1

## 2024-09-30 MED ORDER — HEPARIN SODIUM (PORCINE) 5000 UNIT/ML IJ SOLN
5000.0000 [IU] | Freq: Three times a day (TID) | INTRAMUSCULAR | Status: DC
  Administered 2024-09-30 – 2024-10-12 (×36): 5000 [IU] via SUBCUTANEOUS
  Filled 2024-09-30 (×37): qty 1

## 2024-09-30 MED ORDER — LEVETIRACETAM 500 MG PO TABS
500.0000 mg | ORAL_TABLET | Freq: Two times a day (BID) | ORAL | Status: AC
  Administered 2024-09-30 – 2024-10-04 (×9): 500 mg via ORAL
  Filled 2024-09-30 (×9): qty 1

## 2024-09-30 MED ORDER — SENNOSIDES-DOCUSATE SODIUM 8.6-50 MG PO TABS
1.0000 | ORAL_TABLET | Freq: Two times a day (BID) | ORAL | Status: DC
  Administered 2024-09-30 – 2024-10-12 (×19): 1 via ORAL
  Filled 2024-09-30 (×21): qty 1

## 2024-09-30 MED ORDER — GABAPENTIN 300 MG PO CAPS
300.0000 mg | ORAL_CAPSULE | Freq: Every day | ORAL | Status: DC
  Administered 2024-09-30: 300 mg via ORAL
  Filled 2024-09-30: qty 1

## 2024-09-30 MED ORDER — ACETAMINOPHEN 325 MG PO TABS: 650.0000 mg | ORAL_TABLET | Freq: Four times a day (QID) | ORAL | Status: DC | PRN

## 2024-09-30 MED ORDER — ACETAMINOPHEN 160 MG/5ML PO SOLN: 650.0000 mg | ORAL | Status: DC | PRN

## 2024-09-30 MED ORDER — FENTANYL CITRATE (PF) 50 MCG/ML IJ SOSY
25.0000 ug | PREFILLED_SYRINGE | INTRAMUSCULAR | Status: DC | PRN
  Administered 2024-10-04 – 2024-10-08 (×19): 50 ug via INTRAVENOUS
  Filled 2024-09-30 (×19): qty 1

## 2024-09-30 MED ORDER — POLYETHYLENE GLYCOL 3350 17 G PO PACK
17.0000 g | PACK | Freq: Every day | ORAL | Status: DC
  Administered 2024-09-30 – 2024-10-11 (×7): 17 g via ORAL
  Filled 2024-09-30 (×10): qty 1

## 2024-09-30 MED ORDER — CARVEDILOL 12.5 MG PO TABS
25.0000 mg | ORAL_TABLET | Freq: Two times a day (BID) | ORAL | Status: DC
  Administered 2024-09-30 – 2024-10-10 (×21): 25 mg via ORAL
  Filled 2024-09-30 (×21): qty 2

## 2024-09-30 NOTE — Evaluation (Signed)
 Speech Language Pathology Evaluation Patient Details Name: Ashlee Gordon MRN: [id number] DOB: 1971/08/08 Today's Date: 09/30/2024 Time: 8894-8884 SLP Time Calculation (min) (ACUTE ONLY): 10 min  Problem List:  Patient Active Problem List   Diagnosis Date Noted   SAH (subarachnoid hemorrhage) (HCC) 09/27/2024   Obesity, unspecified 03/21/2024   Vitamin D  deficiency 12/06/2022   H/O gastric sleeve 11/22/2022   Primary hypertension 11/22/2022   Class 3 severe obesity with serious comorbidity and body mass index (BMI) of 60.0 to 69.9 in adult (HCC) 10/27/2022   OSA (obstructive sleep apnea) 10/27/2022   Prediabetes 10/27/2022   Past Medical History:  Past Medical History:  Diagnosis Date   Anemia    Back pain    Depression    Fibromyalgia    Hypertension    Infertility, female    Joint pain    Osteoarthritis    Osteoarthritis    Prediabetes    Sleep apnea    SOBOE (shortness of breath on exertion)    Swelling of both lower extremities    Vitamin D  deficiency    Past Surgical History:  Past Surgical History:  Procedure Laterality Date   IR ANGIO INTRA EXTRACRAN SEL INTERNAL CAROTID UNI L MOD SED  09/27/2024   IR ANGIOGRAM FOLLOW UP STUDY  09/27/2024   IR ANGIOGRAM FOLLOW UP STUDY  09/27/2024   IR ANGIOGRAM FOLLOW UP STUDY  09/27/2024   IR ANGIOGRAM FOLLOW UP STUDY  09/27/2024   IR ANGIOGRAM FOLLOW UP STUDY  09/27/2024   IR INJECT/THERA/INC NEEDLE/CATH/PLC EPI/LUMB/SAC W/IMG  08/05/2021   IR TRANSCATH/EMBOLIZ  09/27/2024   IR US  GUIDE VASC ACCESS RIGHT  09/27/2024   IR US  GUIDE VASC ACCESS RIGHT  09/27/2024   HPI:  53 yo female presenting to ED 12/5 with sharp HA and emesis. CT revealed SAH and CTA showed large aneurysm. S/p coiling embolization procedure 12/5. ETT 12/5-12/6. PMH includes HTN, morbid obesity, prediabetes, depression, fibromyalgia, OA   Assessment / Plan / Recommendation Clinical Impression  Pt denies acute concerns and scored WFL on all administered  subsections of the Cognistat. Ongoing SLP f/u is not needed acutely, will sign off.    SLP Assessment  SLP Recommendation/Assessment: Patient does not need any further Speech Language Pathology Services SLP Visit Diagnosis: Cognitive communication deficit (R41.841)     Assistance Recommended at Discharge  None  Functional Status Assessment Patient has not had a recent decline in their functional status  Frequency and Duration           SLP Evaluation Cognition  Overall Cognitive Status: Within Functional Limits for tasks assessed       Comprehension  Auditory Comprehension Overall Auditory Comprehension: Appears within functional limits for tasks assessed    Expression Expression Primary Mode of Expression: Verbal Verbal Expression Overall Verbal Expression: Appears within functional limits for tasks assessed   Oral / Motor  Oral Motor/Sensory Function Overall Oral Motor/Sensory Function: Within functional limits Motor Speech Overall Motor Speech: Appears within functional limits for tasks assessed            Damien Blumenthal, M.A., CCC-SLP Speech Language Pathology, Acute Rehabilitation Services  Secure Chat preferred [phone]  09/30/2024, 12:21 PM

## 2024-09-30 NOTE — Evaluation (Signed)
 Occupational Therapy Evaluation Patient Details Name: Ashlee Gordon MRN: [id number] DOB: 1971-07-19 Today's Date: 09/30/2024   History of Present Illness   53 yo female admitted 12/5 with sharp HA and emesis at [facility] (+) SAH transfer to [facility]  CT angiogram Large aneurysm; 12/5 intubated for coiling embolization procedure; extubated 12/6; PMH HTN, morbid obesity, prediabetes, depression, fibromyalgia, OA     Clinical Impressions PTA patient independent. Admitted for above and presents with problem list below. Initially attempting eval, but pt declining mobility due to headache pain 8/10 and improved to 4/10 after receiving pain meds. Completing bed mobility with contact guard assist, transfers with contact guard assist, and ADLs with up to min assist.  Pt demonstrating requiring cueing for problem solving and safety with lines, cognition not formally assessed but will benefit from higher level cognitive assessment.  Anticipate no further needs after dc home, but will follow acutely.      If plan is discharge home, recommend the following:   A little help with walking and/or transfers;A little help with bathing/dressing/bathroom;Help with stairs or ramp for entrance;Assist for transportation;Assistance with cooking/housework     Functional Status Assessment   Patient has had a recent decline in their functional status and demonstrates the ability to make significant improvements in function in a reasonable and predictable amount of time.     Equipment Recommendations   None recommended by OT     Recommendations for Other Services         Precautions/Restrictions   Precautions Precautions: Other (comment) Recall of Precautions/Restrictions: Intact Precaution/Restrictions Comments: SBP <200 Restrictions Weight Bearing Restrictions Per Provider Order: No     Mobility Bed Mobility Overal bed mobility: Needs Assistance Bed Mobility: Supine to Sit     Supine to  sit: Contact guard, HOB elevated     General bed mobility comments: very close to EOB therefore close guarding for safety    Transfers Overall transfer level: Needs assistance Equipment used: None Transfers: Sit to/from Stand Sit to Stand: Contact guard assist           General transfer comment: for safety      Balance Overall balance assessment: Mild deficits observed, not formally tested                                         ADL either performed or assessed with clinical judgement   ADL Overall ADL's : Needs assistance/impaired     Grooming: Set up;Sitting           Upper Body Dressing : Set up;Sitting   Lower Body Dressing: Minimal assistance;Sit to/from stand   Toilet Transfer: Software Engineer Details (indicate cue type and reason): to recliner         Functional mobility during ADLs: Contact guard assist;Cueing for safety       Vision   Vision Assessment?: No apparent visual deficits;Wears glasses for reading Additional Comments: pt denies changes, not formally assesed     Perception         Praxis         Pertinent Vitals/Pain Pain Assessment Pain Assessment: 0-10 Pain Score: 4  Pain Location: head Pain Descriptors / Indicators: Headache Pain Intervention(s): Limited activity within patient's tolerance, Monitored during session, Repositioned     Extremity/Trunk Assessment Upper Extremity Assessment Upper Extremity Assessment: Overall WFL for tasks assessed   Lower Extremity Assessment Lower  Extremity Assessment: Defer to PT evaluation   Cervical / Trunk Assessment Cervical / Trunk Assessment: Other exceptions Cervical / Trunk Exceptions: morbid obesity   Communication Communication Communication: No apparent difficulties   Cognition Arousal: Alert Behavior During Therapy: Flat affect Cognition: Cognition impaired           Executive functioning impairment (select all impairments):  Problem solving OT - Cognition Comments: patient oriented and following commands with increased time, poor sequencing with IV lines mgmt and transfers and not acccepting OT assistance.                 Following commands: Impaired Following commands impaired: Follows one step commands with increased time, Follows multi-step commands inconsistently     Cueing  General Comments   Cueing Techniques: Verbal cues  BP stable   Exercises     Shoulder Instructions      Home Living Family/patient expects to be discharged to:: Private residence Living Arrangements: Other relatives Available Help at Discharge: Family;Available 24 hours/day Type of Home: Mobile home Home Access: Stairs to enter Entrance Stairs-Number of Steps: 5 Entrance Stairs-Rails: Right Home Layout: One level     Bathroom Shower/Tub: Chief Strategy Officer: Standard     Home Equipment: None      Lives With: Other (Comment) (sister)    Prior Functioning/Environment Prior Level of Function : Independent/Modified Independent;Driving;Working/employed               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), but disabled    OT Problem List: Decreased strength;Decreased activity tolerance;Impaired balance (sitting and/or standing);Pain;Decreased cognition;Decreased safety awareness;Decreased knowledge of precautions   OT Treatment/Interventions: Self-care/ADL training;Therapeutic exercise;DME and/or AE instruction;Therapeutic activities;Cognitive remediation/compensation;Patient/family education;Balance training      OT Goals(Current goals can be found in the care plan section)   Acute Rehab OT Goals Patient Stated Goal: home OT Goal Formulation: With patient Time For Goal Achievement: 10/14/24 Potential to Achieve Goals: Good   OT Frequency:  Min 2X/week    Co-evaluation              AM-PAC OT 6 Clicks Daily Activity     Outcome Measure Help from another person  eating meals?: None Help from another person taking care of personal grooming?: A Little Help from another person toileting, which includes using toliet, bedpan, or urinal?: A Little Help from another person bathing (including washing, rinsing, drying)?: A Little Help from another person to put on and taking off regular upper body clothing?: A Little Help from another person to put on and taking off regular lower body clothing?: A Little 6 Click Score: 19   End of Session Nurse Communication: Mobility status;Precautions  Activity Tolerance: Patient tolerated treatment well Patient left: in chair;with call bell/phone within reach;with chair alarm set;with nursing/sitter in room;with family/visitor present  OT Visit Diagnosis: Other abnormalities of gait and mobility (R26.89);Pain Pain - part of body:  (head)                Time: 8988-8957 OT Time Calculation (min): 31 min Charges:  OT General Charges $OT Visit: 1 Visit OT Evaluation $OT Eval Moderate Complexity: 1 Mod OT Treatments $Self Care/Home Management : 8-22 mins  Etta NOVAK, OT Acute Rehabilitation Services Office [phone] Secure Chat Preferred    Etta GORMAN Hope 09/30/2024, 12:47 PM

## 2024-09-30 NOTE — Progress Notes (Signed)
 Awake and alert Language intact 5/5 strength Sensation intact No inattention  Target SBP less than 200 Monitor for spasm

## 2024-09-30 NOTE — Progress Notes (Signed)
 NAME:  Lenya Abreu, MRN:  [id number], DOB:  09-05-1971, LOS: 3 ADMISSION DATE:  09/27/2024, CONSULTATION DATE:  12/5  REFERRING MD: Pamella CHIEF COMPLAINT:  Severe Headache   History of Present Illness:  Patient is a 53 year old female with significant past medical history of sleep apnea, hypertension, obesity, prediabetes, depression, fibromyalgia, anemia, and osteoarthritis who [facility] ED after a sudden onset of a severe headache approximately around 0 530 on 12/5.  Per report, severe headache occurred while her sister and boyfriend were arguing at the residence.  Patient was immediately transported to CT scan-for stat head showing concerns of a acute subarachnoid hemorrhage, aneurysmal rupture, mild ventriculomegaly, no intraventricular hemorrhage and no midline shift.  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  Neurovascular was consulted and requested patient be transferred to [facility], ED for further neuro eval and and possible IR intervention.  Patient was started on Cleviprex  due to hypertension.  Upon arrival, patient is nauseous and also drowsy but not having no focal neuro deficits. PCCM consulted for admission to 4N ICU after endovascular intervention- aneurysm coiling and possible EVD placement.   Upon ED assessment, patient drowsy, still complaining of headache. Patient able to state name, and oriented to place but not able to state time. Patient has no focal neuro deficits at this time, able to move all extremities but does have generalized weakness. Patient on cleviprex  at this time with ongoing titration.   Pertinent  Medical History   Past Medical History:  Diagnosis Date   Anemia    Back pain    Depression    Fibromyalgia    Hypertension    Infertility, female    Joint pain    Osteoarthritis    Osteoarthritis    Prediabetes    Sleep apnea    SOBOE  (shortness of breath on exertion)    Swelling of both lower extremities    Vitamin D  deficiency      Significant Hospital Events: Including procedures, antibiotic start and stop dates in addition to other pertinent events   12/5 PCCM admit- SAH due to Left ICA aneurysm, plan for coiling by NIR, eval for EVD 12/6 Extubated 12/7 art line x 2 then a 3rd overnight (prior ones malfunctioned) 12/8 significant headache 8/10 scale. Added gabapentin , Fioricet , PRN fentanyl . Also asking to try CPAP while in hospital. New SBP goal of < 200 per Dr. Lester 12/8 CT head with mild improvement in [facility]  Interim History / Subjective:  NAEON except needed new art line again (3rd one yesterday).  SBP goal liberated to < 200 today by Dr. Lester  Objective    Blood pressure (!) 149/87, pulse 82, temperature 98.2 F (36.8 C), temperature source Oral, resp. rate 17, height 5' 7.99 (1.727 m), weight (!) 205.6 kg, last menstrual period 10/22/2020, SpO2 92%.        Intake/Output Summary (Last 24 hours) at 09/30/2024 0809 Last data filed at 09/30/2024 0700 Gross per 24 hour  Intake 579.47 ml  Output 2000 ml  Net -1420.53 ml   Filed Weights   09/27/24 1345 09/28/24 0315  Weight: (!) 200 kg (!) 205.6 kg   Physical Exam: General: Morbidly obese female, resting in bed, in NAD. Neuro: A&O x 3, no deficits. Has 8/10 headache. HEENT: [place]/AT. Sclerae anicteric. EOMI. Cardiovascular: RRR, no M/R/G.  Lungs: Respirations even and unlabored.  CTA bilaterally, No W/R/R. Abdomen: BS  x 4, soft, NT/ND.  Musculoskeletal: No gross deformities, no edema.   CT head 09/28/24 - S/p coiling of left ICA aneurysm, Similar SAH, nod midline shift/mass effect of hydrocephalus   Assessment and Plan   Subarachnoid Hemorrhage (H/H 3) due to Left ICA Aneurysm  - s/p balloon assisted coiling 12/5 Ongoing headache - 2/2 above HTN hx  - NSGY following, appreciate the assistance. - Continue Clevidipine  but start to wean, goal SBP <  200 per Dr. Lester. - Continue Amlodipine , Carvedilol  (increase from 12.5 to 25), Losartan  (increase from 25 to 50). - Continue Nimodipine , Keppra . - PRN Labetalol . - Add Fioricet , Gabapentin , PRN Fentanyl . - Continue Tylenol  PRN, Decadron  PRN, Oxycodone  PRN. - TCD's M/W/F - Empiric Keppra ,   Sleep Apnea hx - 2/2 obesity. Does not wear CPAP routinely but would like to try it while hospitalized. - Start CPAP at bedtime.  Hx Depression, Fibromyalgia - Resume PTA Bupropion .  Prediabetes - Hgb A1c 6.1. - SSI as needed while on Steroids. - Counseling.  CC time: 30 min.   Sammi Gore, PA - C [facility] For pager details, please see AMION or use Epic chat  After 1900, please call [facility] for cross coverage needs 09/30/2024, 8:18 AM

## 2024-09-30 NOTE — TOC CM/SW Note (Signed)
 Transition of Care [facility]) - Inpatient Brief Assessment   Patient Details  Name: Ashlee Gordon MRN: [id number] Date of Birth: August 12, 1971  [facility] of [facility]) CM/SW Contact:    Sparkle Aube E Hedy Garro, LCSW Phone Number: 09/30/2024, 11:52 AM   Clinical Narrative: Patient was admitted for [facility]. Therapy has assessed and no f/u recommended. Please consult ICM if needs arise.   Transition of Care Asessment: Insurance and Status: Insurance coverage has been reviewed Patient has primary care physician: Yes     Prior/Current Home Services: No current home services Social Drivers of Health Review: SDOH reviewed no interventions necessary Readmission risk has been reviewed: Yes Transition of care needs: no transition of care needs at this time

## 2024-09-30 NOTE — Progress Notes (Signed)
  Transcranial Doppler   Date POD PCO2 HCT BP   MCA ACA PCA OPHT SIPH VERT Basilar  12/5 GC         Right  Left   86  74   *  -33   47  *   23  13   19  23    -17  -25   -15        12/6 CK 1        Right  Left   54   55    -27   -37    24   28    14   24     *   21   *    *    *                 Right  Left    67   80    -28   -23    -31   28    19   22     *   *    -38   -41    -41                   Right  Left                                                                 Right  Left                                                               Right  Left                                                               Right  Left                                                       MCA = Middle Cerebral Artery      OPHT = Opthalmic Artery     BASILAR = Basilar Artery   ACA = Anterior Cerebral Artery     SIPH = Carotid Siphon PCA = Posterior Cerebral Artery   VERT = Verterbral Artery                    Normal MCA = 62+\-12 ACA = 50+\-12 PCA = 42+\-23     * Unable to insonate [place] Ratio R: 3.7  L: 2.58

## 2024-10-01 ENCOUNTER — Inpatient Hospital Stay (HOSPITAL_COMMUNITY)

## 2024-10-01 LAB — URINALYSIS, ROUTINE W REFLEX MICROSCOPIC
Bilirubin Urine: NEGATIVE
Glucose, UA: NEGATIVE mg/dL
Hgb urine dipstick: NEGATIVE
Ketones, ur: NEGATIVE mg/dL
Leukocytes,Ua: NEGATIVE
Nitrite: NEGATIVE
Protein, ur: NEGATIVE mg/dL
Specific Gravity, Urine: 1.016 (ref 1.005–1.030)
pH: 6 (ref 5.0–8.0)

## 2024-10-01 LAB — CBC
HCT: 37.6 % (ref 36.0–46.0)
Hemoglobin: 12.1 g/dL (ref 12.0–15.0)
MCH: 27.4 pg (ref 26.0–34.0)
MCHC: 32.2 g/dL (ref 30.0–36.0)
MCV: 85.1 fL (ref 80.0–100.0)
Platelets: 254 K/uL (ref 150–400)
RBC: 4.42 MIL/uL (ref 3.87–5.11)
RDW: 14.7 % (ref 11.5–15.5)
WBC: 14.1 K/uL — ABNORMAL HIGH (ref 4.0–10.5)
nRBC: 0 % (ref 0.0–0.2)

## 2024-10-01 LAB — BASIC METABOLIC PANEL WITH GFR
Anion gap: 18 — ABNORMAL HIGH (ref 5–15)
Anion gap: 6 (ref 5–15)
BUN: 16 mg/dL (ref 6–20)
BUN: 15 mg/dL (ref 6–20)
CO2: 19 mmol/L — ABNORMAL LOW (ref 22–32)
CO2: 27 mmol/L (ref 22–32)
Calcium: 8.2 mg/dL — ABNORMAL LOW (ref 8.9–10.3)
Calcium: 8.2 mg/dL — ABNORMAL LOW (ref 8.9–10.3)
Chloride: 105 mmol/L (ref 98–111)
Chloride: 106 mmol/L (ref 98–111)
Creatinine, Ser: 1.04 mg/dL — ABNORMAL HIGH (ref 0.44–1.00)
Creatinine, Ser: 1.1 mg/dL — ABNORMAL HIGH (ref 0.44–1.00)
GFR, Estimated: >60 mL/min (ref >60)
GFR, Estimated: >60 mL/min (ref >60)
Glucose, Bld: 126 mg/dL — ABNORMAL HIGH (ref 70–99)
Glucose, Bld: 169 mg/dL — ABNORMAL HIGH (ref 70–99)
Potassium: 3.7 mmol/L (ref 3.5–5.1)
Potassium: 3.9 mmol/L (ref 3.5–5.1)
Sodium: 142 mmol/L (ref 135–145)
Sodium: 139 mmol/L (ref 135–145)

## 2024-10-01 LAB — OSMOLALITY, URINE: Osmolality, Ur: 547 mosm/kg (ref 300–900)

## 2024-10-01 LAB — GLUCOSE, CAPILLARY
Glucose-Capillary: 109 mg/dL — ABNORMAL HIGH (ref 70–99)
Glucose-Capillary: 127 mg/dL — ABNORMAL HIGH (ref 70–99)
Glucose-Capillary: 169 mg/dL — ABNORMAL HIGH (ref 70–99)
Glucose-Capillary: 116 mg/dL — ABNORMAL HIGH (ref 70–99)
Glucose-Capillary: 177 mg/dL — ABNORMAL HIGH (ref 70–99)

## 2024-10-01 LAB — LACTIC ACID, PLASMA: Lactic Acid, Venous: 1.3 mmol/L (ref 0.5–1.9)

## 2024-10-01 LAB — SODIUM, URINE, RANDOM: Sodium, Ur: 90 mmol/L

## 2024-10-01 MED ORDER — HYDRALAZINE HCL 50 MG PO TABS
100.0000 mg | ORAL_TABLET | Freq: Three times a day (TID) | ORAL | Status: DC
  Administered 2024-10-01 – 2024-10-12 (×35): 100 mg via ORAL
  Filled 2024-10-01 (×35): qty 2

## 2024-10-01 MED ORDER — BUTALBITAL-APAP-CAFFEINE 50-325-40 MG PO TABS
1.0000 | ORAL_TABLET | ORAL | Status: DC | PRN
  Administered 2024-10-01 – 2024-10-11 (×57): 1 via ORAL
  Filled 2024-10-01 (×58): qty 1

## 2024-10-01 MED ORDER — GERHARDT'S BUTT CREAM
TOPICAL_CREAM | CUTANEOUS | Status: DC | PRN
  Filled 2024-10-01: qty 60

## 2024-10-01 MED ORDER — MAGNESIUM SULFATE 2 GM/50ML IV SOLN
2.0000 g | Freq: Once | INTRAVENOUS | Status: AC
  Administered 2024-10-01: 2 g via INTRAVENOUS
  Filled 2024-10-01: qty 50

## 2024-10-01 MED ORDER — POTASSIUM CHLORIDE CRYS ER 20 MEQ PO TBCR
40.0000 meq | EXTENDED_RELEASE_TABLET | Freq: Once | ORAL | Status: AC
  Administered 2024-10-01: 40 meq via ORAL
  Filled 2024-10-01: qty 2

## 2024-10-01 MED ORDER — GABAPENTIN 300 MG PO CAPS
300.0000 mg | ORAL_CAPSULE | Freq: Three times a day (TID) | ORAL | Status: DC
  Administered 2024-10-01 – 2024-10-09 (×24): 300 mg via ORAL
  Filled 2024-10-01 (×25): qty 1

## 2024-10-01 MED ORDER — CAFFEINE 200 MG PO TABS
200.0000 mg | ORAL_TABLET | Freq: Four times a day (QID) | ORAL | Status: DC | PRN
  Administered 2024-10-01 – 2024-10-07 (×10): 200 mg via ORAL
  Filled 2024-10-01 (×14): qty 1

## 2024-10-01 MED ORDER — SODIUM CHLORIDE 0.9 % IV BOLUS
500.0000 mL | Freq: Once | INTRAVENOUS | Status: AC
  Administered 2024-10-01: 500 mL via INTRAVENOUS

## 2024-10-01 NOTE — Progress Notes (Addendum)
 NAME:  Ashlee Gordon, MRN:  [id number], DOB:  05-08-1971, LOS: 4 ADMISSION DATE:  09/27/2024, CONSULTATION DATE:  12/5  REFERRING MD: Pamella CHIEF COMPLAINT:  Severe Headache   History of Present Illness:  Patient is a 53 year old female with significant past medical history of sleep apnea, hypertension, obesity, prediabetes, depression, fibromyalgia, anemia, and osteoarthritis who [facility] ED after a sudden onset of a severe headache approximately around 0 530 on 12/5.  Per report, severe headache occurred while her sister and boyfriend were arguing at the residence.  Patient was immediately transported to CT scan-for stat head showing concerns of a acute subarachnoid hemorrhage, aneurysmal rupture, mild ventriculomegaly, no intraventricular hemorrhage and no midline shift.  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  Neurovascular was consulted and requested patient be transferred to [facility], ED for further neuro eval and and possible IR intervention.  Patient was started on Cleviprex  due to hypertension.  Upon arrival, patient is nauseous and also drowsy but not having no focal neuro deficits. PCCM consulted for admission to 4N ICU after endovascular intervention- aneurysm coiling and possible EVD placement.   Upon ED assessment, patient drowsy, still complaining of headache. Patient able to state name, and oriented to place but not able to state time. Patient has no focal neuro deficits at this time, able to move all extremities but does have generalized weakness. Patient on cleviprex  at this time with ongoing titration.   Pertinent  Medical History   Past Medical History:  Diagnosis Date   Anemia    Back pain    Depression    Fibromyalgia    Hypertension    Infertility, female    Joint pain    Osteoarthritis    Osteoarthritis    Prediabetes    Sleep apnea    SOBOE  (shortness of breath on exertion)    Swelling of both lower extremities    Vitamin D  deficiency      Significant Hospital Events: Including procedures, antibiotic start and stop dates in addition to other pertinent events   12/5 PCCM admit- SAH due to Left ICA aneurysm, plan for coiling by NIR, eval for EVD 12/6 Extubated 12/7 art line x 2 then a 3rd overnight (prior ones malfunctioned) 12/8 significant headache 8/10 scale. Added gabapentin , Fioricet , PRN fentanyl . Also asking to try CPAP while in hospital. New SBP goal of < 200 per Dr. Lester 12/8 CT head with mild improvement in [facility]  Interim History / Subjective:    clevidipine  11 mg/hr (10/01/24 0600)   magnesium  sulfate bolus IVPB     No events. UOP starting to pick up, need to keep eye on  Objective    Blood pressure (!) 149/87, pulse 82, temperature 99.1 F (37.3 C), temperature source Oral, resp. rate (!) 21, height 5' 7.99 (1.727 m), weight (!) 205.6 kg, last menstrual period 10/22/2020, SpO2 93%.        Intake/Output Summary (Last 24 hours) at 10/01/2024 9360 Last data filed at 10/01/2024 0600 Gross per 24 hour  Intake 1292.34 ml  Output 3100 ml  Net -1807.66 ml   Filed Weights   09/27/24 1345 09/28/24 0315  Weight: (!) 200 kg (!) 205.6 kg   Physical Exam: No distress Moves to command EOMI pupils equal Strength equal Abd soft Nonlabored breathing +Murmur, ext warm  Labs reviewed: why is bicarb down 9? Cr also driving up  Assessment and Plan   Subarachnoid Hemorrhage (H/H 3) due to Left ICA Aneurysm  - s/p balloon assisted coiling 12/5; tortuous irregular walled vessel noted Ongoing headache - 2/2 above Refractory HTN Sleep Apnea hx - 2/2 obesity. Trying CPAP at bedtime per request, needs OP sleep referrral Hx Depression, Fibromyalgia on PTA Bupropion . Class 3 obesity Prediabetes - Hgb A1c 6.1. Polyuria, decreased bicarb, new AGMA- sodium holding steady; essentially net even from admit at this point;  need to watch for DI, RE AGMA wonder if lab error  DC fingersticks TCDs per neuro IR Continue nimodipine  and vasospasm watch Increase hydralazine , continue amlodipine , losartan , coreg ; wean ccb gtt goal SBP < 170 (would like her normotensive for non-SAH reasons) Recheck BMP noon, if Cr going up may give her some fluid back as bicarb; f/u urine osms; check UA  33 min critical care time  Rolan Sharps MD [place] [facility]  Prefer securechat for cross cover needs If after hours (7p-7a), please call E-link

## 2024-10-01 NOTE — Plan of Care (Signed)
 Problem: Education: Goal: Knowledge of disease or condition will improve Outcome: Progressing Goal: Knowledge of secondary prevention will improve (MUST DOCUMENT ALL) Outcome: Progressing Goal: Knowledge of patient specific risk factors will improve (DELETE if not current risk factor) Outcome: Progressing   Problem: Spontaneous Subarachnoid Hemorrhage Tissue Perfusion: Goal: Complications of Spontaneous Subarachnoid Hemorrhage will be minimized Outcome: Progressing   Problem: Coping: Goal: Will verbalize positive feelings about self Outcome: Progressing Goal: Will identify appropriate support needs Outcome: Progressing   Problem: Health Behavior/Discharge Planning: Goal: Ability to manage health-related needs will improve Outcome: Progressing Goal: Goals will be collaboratively established with patient/family Outcome: Progressing   Problem: Self-Care: Goal: Ability to participate in self-care as condition permits will improve Outcome: Progressing Goal: Verbalization of feelings and concerns over difficulty with self-care will improve Outcome: Progressing Goal: Ability to communicate needs accurately will improve Outcome: Progressing   Problem: Nutrition: Goal: Risk of aspiration will decrease Outcome: Progressing Goal: Dietary intake will improve Outcome: Progressing   Problem: Education: Goal: Ability to describe self-care measures that may prevent or decrease complications (Diabetes Survival Skills Education) will improve Outcome: Progressing Goal: Individualized Educational Video(s) Outcome: Progressing   Problem: Coping: Goal: Ability to adjust to condition or change in health will improve Outcome: Progressing   Problem: Fluid Volume: Goal: Ability to maintain a balanced intake and output will improve Outcome: Progressing   Problem: Health Behavior/Discharge Planning: Goal: Ability to identify and utilize available resources and services will  improve Outcome: Progressing Goal: Ability to manage health-related needs will improve Outcome: Progressing   Problem: Metabolic: Goal: Ability to maintain appropriate glucose levels will improve Outcome: Progressing   Problem: Nutritional: Goal: Maintenance of adequate nutrition will improve Outcome: Progressing Goal: Progress toward achieving an optimal weight will improve Outcome: Progressing   Problem: Skin Integrity: Goal: Risk for impaired skin integrity will decrease Outcome: Progressing   Problem: Tissue Perfusion: Goal: Adequacy of tissue perfusion will improve Outcome: Progressing   Problem: Education: Goal: Knowledge of General Education information will improve Description: Including pain rating scale, medication(s)/side effects and non-pharmacologic comfort measures Outcome: Progressing   Problem: Health Behavior/Discharge Planning: Goal: Ability to manage health-related needs will improve Outcome: Progressing   Problem: Clinical Measurements: Goal: Ability to maintain clinical measurements within normal limits will improve Outcome: Progressing Goal: Will remain free from infection Outcome: Progressing Goal: Diagnostic test results will improve Outcome: Progressing Goal: Respiratory complications will improve Outcome: Progressing Goal: Cardiovascular complication will be avoided Outcome: Progressing   Problem: Activity: Goal: Risk for activity intolerance will decrease Outcome: Progressing   Problem: Nutrition: Goal: Adequate nutrition will be maintained Outcome: Progressing   Problem: Coping: Goal: Level of anxiety will decrease Outcome: Progressing   Problem: Elimination: Goal: Will not experience complications related to bowel motility Outcome: Progressing Goal: Will not experience complications related to urinary retention Outcome: Progressing   Problem: Pain Managment: Goal: General experience of comfort will improve and/or be  controlled Outcome: Progressing   Problem: Safety: Goal: Ability to remain free from injury will improve Outcome: Progressing   Problem: Skin Integrity: Goal: Risk for impaired skin integrity will decrease Outcome: Progressing   Problem: Education: Goal: Understanding of CV disease, CV risk reduction, and recovery process will improve Outcome: Progressing Goal: Individualized Educational Video(s) Outcome: Progressing   Problem: Activity: Goal: Ability to return to baseline activity level will improve Outcome: Progressing   Problem: Cardiovascular: Goal: Ability to achieve and maintain adequate cardiovascular perfusion will improve Outcome: Progressing Goal: Vascular access site(s) Level 0-1 will be maintained  Outcome: Progressing   Problem: Health Behavior/Discharge Planning: Goal: Ability to safely manage health-related needs after discharge will improve Outcome: Progressing

## 2024-10-01 NOTE — Progress Notes (Addendum)
 Physical Therapy Treatment Patient Details Name: Ashlee Gordon MRN: [id number] DOB: 09/12/1971 Today's Date: 10/01/2024   History of Present Illness [name] adm 09/27/24 with sharp HA and emesis at [facility], CT (+) SAH, transfer to [facility] . CT angiogram Large aneurysm; 12/5 intubated for coiling embolization; extubated 12/6. PMHx: HTN, morbid obesity, prediabetes, depression, fibromyalgia, OA    PT Comments  Pt pleasant, maintaining eyes closed due to continued HA. Kynlei responding appropriately to all questions and command but limited by pain. Pt able to walk in room but denied further gait due to pain and encouraged continued mobility up to chair and walking attempts with staff.   In bed BP 122/64 (85) With transfer in room and to chair 149/74 HR 72 SPO2 95% on RA    If plan is discharge home, recommend the following: Assist for transportation;Help with stairs or ramp for entrance   Can travel by private vehicle        Equipment Recommendations  None recommended by PT    Recommendations for Other Services       Precautions / Restrictions Precautions Precautions: Other (comment) Recall of Precautions/Restrictions: Intact Precaution/Restrictions Comments: SBP <170     Mobility  Bed Mobility Overal bed mobility: Needs Assistance Bed Mobility: Supine to Sit     Supine to sit: HOB elevated, Supervision     General bed mobility comments: HOB 30 degrees with pt able to pivot to EOB with rail and increased time, supervision for lines and safety    Transfers Overall transfer level: Needs assistance   Transfers: Sit to/from Stand Sit to Stand: Contact guard assist           General transfer comment: CGA for safety with pt reaching out for single UE support    Ambulation/Gait Ambulation/Gait assistance: Min assist Gait Distance (Feet): 20 Feet Assistive device: 1 person hand held assist Gait Pattern/deviations: Step-through pattern, Decreased stride length, Wide  base of support   Gait velocity interpretation: 1.31 - 2.62 ft/sec, indicative of limited community ambulator   General Gait Details: pt able to walk in room and to chair but denied further gait due to nausea and HA   Stairs             Wheelchair Mobility     Tilt Bed    Modified Rankin (Stroke Patients Only) Modified Rankin (Stroke Patients Only) Pre-Morbid Rankin Score: Slight disability Modified Rankin: Slight disability     Balance Overall balance assessment: Mild deficits observed, not formally tested                                          Communication Communication Communication: No apparent difficulties  Cognition Arousal: Alert Behavior During Therapy: Flat affect   PT - Cognitive impairments: No apparent impairments                         Following commands: Impaired Following commands impaired: Follows one step commands with increased time    Cueing Cueing Techniques: Verbal cues  Exercises General Exercises - Lower Extremity Long Arc Quad: AROM, Both, 10 reps, Seated, Strengthening Hip Flexion/Marching: AROM, Both, Seated, Strengthening, 10 reps    General Comments        Pertinent Vitals/Pain Pain Assessment Pain Assessment: 0-10 Pain Score: 7  Pain Location: head Pain Descriptors / Indicators: Headache Pain Intervention(s): Limited activity within  patient's tolerance, Monitored during session, Repositioned    Home Living                          Prior Function            PT Goals (current goals can now be found in the care plan section) Progress towards PT goals: Progressing toward goals    Frequency    Min 2X/week      PT Plan      Co-evaluation              AM-PAC PT 6 Clicks Mobility   Outcome Measure  Help needed turning from your back to your side while in a flat bed without using bedrails?: None Help needed moving from lying on your back to sitting on the side  of a flat bed without using bedrails?: A Little Help needed moving to and from a bed to a chair (including a wheelchair)?: A Little Help needed standing up from a chair using your arms (e.g., wheelchair or bedside chair)?: A Little Help needed to walk in hospital room?: A Little Help needed climbing 3-5 steps with a railing? : Total 6 Click Score: 17    End of Session   Activity Tolerance: Patient limited by pain Patient left: in chair;with call bell/phone within reach;with chair alarm set;with family/visitor present Nurse Communication: Mobility status PT Visit Diagnosis: Difficulty in walking, not elsewhere classified (R26.2);Other symptoms and signs involving the nervous system (R29.898)     Time: 0939-1000 PT Time Calculation (min) (ACUTE ONLY): 21 min  Charges:    $Therapeutic Activity: 8-22 mins PT General Charges $$ ACUTE PT VISIT: 1 Visit                     Lenoard SQUIBB, PT Acute Rehabilitation Services Office: [phone]    Lenoard NOVAK Lalani Winkles 10/01/2024, 1:54 PM

## 2024-10-01 NOTE — Progress Notes (Signed)
  Transcranial Doppler   Date POD PCO2 HCT BP   MCA ACA PCA OPHT SIPH VERT Basilar  12/5 GC         Right  Left   86  74   *  -33   47  *   23  13   19  23    -17  -25   -15        12/6 CK 1        Right  Left   54   55    -27   -37    24   28    14   24     *   21   *    *    *       12/8          Right  Left    67   80    -28   -23    -31   28    19   22     *   *    -38   -41    -41       12/9 rs            Right  Left    96   101    -101   -40    22   38    21   26    26    46    -29   -35    -53                   Right  Left                                                               Right  Left                                                               Right  Left                                                       MCA = Middle Cerebral Artery      OPHT = Opthalmic Artery     BASILAR = Basilar Artery   ACA = Anterior Cerebral Artery     SIPH = Carotid Siphon PCA = Posterior Cerebral Artery   VERT = Verterbral Artery                    Normal MCA = 62+\-12 ACA = 50+\-12 PCA = 42+\-23   [place]arrd Ratio R: 3.5  L: 2.9

## 2024-10-01 NOTE — Progress Notes (Signed)
 Confirmed SBP goal with Dr. Claudene of < 170

## 2024-10-02 LAB — CBC
HCT: 37.3 % (ref 36.0–46.0)
Hemoglobin: 11.8 g/dL — ABNORMAL LOW (ref 12.0–15.0)
MCH: 27.2 pg (ref 26.0–34.0)
MCHC: 31.6 g/dL (ref 30.0–36.0)
MCV: 85.9 fL (ref 80.0–100.0)
Platelets: 229 K/uL (ref 150–400)
RBC: 4.34 MIL/uL (ref 3.87–5.11)
RDW: 14.7 % (ref 11.5–15.5)
WBC: 14.7 K/uL — ABNORMAL HIGH (ref 4.0–10.5)
nRBC: 0 % (ref 0.0–0.2)

## 2024-10-02 LAB — SODIUM: Sodium: 142 mmol/L (ref 135–145)

## 2024-10-02 LAB — BASIC METABOLIC PANEL WITH GFR
Anion gap: 7 (ref 5–15)
BUN: 12 mg/dL (ref 6–20)
CO2: 26 mmol/L (ref 22–32)
Calcium: 7.9 mg/dL — ABNORMAL LOW (ref 8.9–10.3)
Chloride: 105 mmol/L (ref 98–111)
Creatinine, Ser: 1.05 mg/dL — ABNORMAL HIGH (ref 0.44–1.00)
GFR, Estimated: >60 mL/min (ref >60)
Glucose, Bld: 110 mg/dL — ABNORMAL HIGH (ref 70–99)
Potassium: 4.6 mmol/L (ref 3.5–5.1)
Sodium: 138 mmol/L (ref 135–145)

## 2024-10-02 LAB — PHOSPHORUS: Phosphorus: 3.5 mg/dL (ref 2.5–4.6)

## 2024-10-02 LAB — MAGNESIUM: Magnesium: 2.2 mg/dL (ref 1.7–2.4)

## 2024-10-02 MED ORDER — SODIUM CHLORIDE 0.9 % IV BOLUS
1000.0000 mL | Freq: Once | INTRAVENOUS | Status: AC
  Administered 2024-10-02: 1000 mL via INTRAVENOUS

## 2024-10-02 MED ORDER — SODIUM CHLORIDE 0.9 % IV SOLN: INTRAVENOUS | Status: AC

## 2024-10-02 NOTE — Progress Notes (Addendum)
 0800 A-line damped doesn't draw blood and very positional unsure how accurate BP are will try for cuff pressure  1000 unable to get any pressure on A-Line  1030 patient refusing to get cleaned up while we have a nurse and nursing tech at bedside patient and bed full urine   1130 bath completed patient up to chair  1600 patient to and from [facility] then back to bed patient wants to lay on left side but complains of burning in the left thigh. CC MD already discussed with her that she needs to ,move more and not keep pressure just to that side of her body and why.

## 2024-10-02 NOTE — Progress Notes (Signed)
 2000 - Pt complained of burning in left thigh.  PRN medications administered and mobilizing encouraged.    2130 - Pt requested purewick - education provided on mobilizing.  Pt verbalized understanding and agreed to mobilize to [facility].    Pt dangled on edge of bed with RN and NT.  Pt stated she had a washcloth between her legs.  Pt pulled out washcloth - cloth covered in stool.  Pt stood and pivoted to [facility].  Pt bathed after void/BM and requested washcloth between her thighs as she is leaking stool.  Pt educated on not placing a washcloth between her thighs due to increase in risk for infection and skin breakdown.  Pt verbalized understanding.  Pt tolerated activity fair - complained of headache.  Emotional support provided.  Pt repositioned self in bed and resting comfortably.

## 2024-10-02 NOTE — Progress Notes (Signed)
 NAME:  Ashlee Gordon, MRN:  [id number], DOB:  03-29-1971, LOS: 5 ADMISSION DATE:  09/27/2024, CONSULTATION DATE:  12/5  REFERRING MD: Pamella CHIEF COMPLAINT:  Severe Headache   History of Present Illness:  Patient is a 53 year old female with significant past medical history of sleep apnea, hypertension, obesity, prediabetes, depression, fibromyalgia, anemia, and osteoarthritis who presented to [facility] ED after a sudden onset of a severe headache approximately around 0 530 on 12/5.  Per report, severe headache occurred while her sister and boyfriend were arguing at the residence.  Patient was immediately transported to CT scan-for stat head showing concerns of a acute subarachnoid hemorrhage, aneurysmal rupture, mild ventriculomegaly, no intraventricular hemorrhage and no midline shift.  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  Neurovascular was consulted and requested patient be transferred to [facility] ED for further neuro eval and and possible IR intervention.  Patient was started on Cleviprex  due to hypertension.  Upon arrival, patient is nauseous and also drowsy but not having no focal neuro deficits. PCCM consulted for admission to 4N ICU after endovascular intervention- aneurysm coiling and possible EVD placement.   Upon ED assessment, patient drowsy, still complaining of headache. Patient able to state name, and oriented to place but not able to state time. Patient has no focal neuro deficits at this time, able to move all extremities but does have generalized weakness. Patient on cleviprex  at this time with ongoing titration.   Pertinent  Medical History   Past Medical History:  Diagnosis Date   Anemia    Back pain    Depression    Fibromyalgia    Hypertension    Infertility, female    Joint pain    Osteoarthritis    Osteoarthritis    Prediabetes    Sleep apnea    SOBOE  (shortness of breath on exertion)    Swelling of both lower extremities    Vitamin D  deficiency      Significant Hospital Events: Including procedures, antibiotic start and stop dates in addition to other pertinent events   12/5 PCCM admit- [facility] due to Left ICA aneurysm, plan for coiling by NIR, eval for EVD 12/6 Extubated 12/7 art line x 2 then a 3rd overnight (prior ones malfunctioned) 12/8 significant headache 8/10 scale. Added gabapentin , Fioricet , PRN fentanyl . Also asking to try CPAP while in hospital. New SBP goal of < 200 per Dr. Lester 12/8 CT head with mild improvement in [facility]  Interim History / Subjective:    sodium chloride      clevidipine  Stopped (10/01/24 0941)   sodium chloride      Off cleviprex , feels a little better.  Objective    Blood pressure (!) 158/93, pulse 67, temperature 99.3 F (37.4 C), temperature source Axillary, resp. rate (!) 23, height 5' 7.99 (1.727 m), weight (!) 205.6 kg, last menstrual period 10/22/2020, SpO2 93%.        Intake/Output Summary (Last 24 hours) at 10/02/2024 1347 Last data filed at 10/02/2024 1200 Gross per 24 hour  Intake 1163.67 ml  Output 2550 ml  Net -1386.33 ml   Filed Weights   09/27/24 1345 09/28/24 0315  Weight: (!) 200 kg (!) 205.6 kg   Physical Exam: No distress Moves to command RASS 0 Appears more comfortable today Nonfocal neuro   Sodium a little down, polyuric query low grade CSW  Assessment and Plan   Subarachnoid Hemorrhage (H/H 3)  due to Left ICA Aneurysm  - s/p balloon assisted coiling 12/5; tortuous irregular walled vessel noted Ongoing headache - 2/2 above Refractory HTN Sleep Apnea hx - 2/2 obesity. Trying CPAP at bedtime per request, needs OP sleep referrral Hx Depression, Fibromyalgia on PTA Bupropion . Class 3 obesity Prediabetes - Hgb A1c 6.1. Polyuria, decreased bicarb, new AGMA- sodium holding steady; essentially net even from admit at this point; need to watch for DI, RE AGMA wonder if  lab error  Keep euvolemic and eunatremic: crystalloid bolus and drip ordered; sodium to BID TCDs per neuro IR Continue nimodipine  and vasospasm watch Continue PO antihypertensives DC a line, matching cuff pressure and failing anyway Discussed that she needs to push mobility   Rolan Sharps MD [facility]  Prefer securechat for cross cover needs If after hours (7p-7a), please call E-link

## 2024-10-02 NOTE — Plan of Care (Signed)
 Problem: Education: Goal: Knowledge of disease or condition will improve Outcome: Progressing Goal: Knowledge of secondary prevention will improve (MUST DOCUMENT ALL) Outcome: Progressing Goal: Knowledge of patient specific risk factors will improve (DELETE if not current risk factor) Outcome: Progressing   Problem: Spontaneous Subarachnoid Hemorrhage Tissue Perfusion: Goal: Complications of Spontaneous Subarachnoid Hemorrhage will be minimized Outcome: Progressing   Problem: Coping: Goal: Will verbalize positive feelings about self Outcome: Progressing Goal: Will identify appropriate support needs Outcome: Progressing   Problem: Health Behavior/Discharge Planning: Goal: Ability to manage health-related needs will improve Outcome: Progressing Goal: Goals will be collaboratively established with patient/family Outcome: Progressing   Problem: Self-Care: Goal: Ability to participate in self-care as condition permits will improve Outcome: Progressing Goal: Verbalization of feelings and concerns over difficulty with self-care will improve Outcome: Progressing Goal: Ability to communicate needs accurately will improve Outcome: Progressing   Problem: Nutrition: Goal: Risk of aspiration will decrease Outcome: Progressing Goal: Dietary intake will improve Outcome: Progressing   Problem: Education: Goal: Ability to describe self-care measures that may prevent or decrease complications (Diabetes Survival Skills Education) will improve Outcome: Progressing Goal: Individualized Educational Video(s) Outcome: Progressing   Problem: Coping: Goal: Ability to adjust to condition or change in health will improve Outcome: Progressing   Problem: Fluid Volume: Goal: Ability to maintain a balanced intake and output will improve Outcome: Progressing   Problem: Health Behavior/Discharge Planning: Goal: Ability to identify and utilize available resources and services will  improve Outcome: Progressing Goal: Ability to manage health-related needs will improve Outcome: Progressing   Problem: Metabolic: Goal: Ability to maintain appropriate glucose levels will improve Outcome: Progressing   Problem: Nutritional: Goal: Maintenance of adequate nutrition will improve Outcome: Progressing Goal: Progress toward achieving an optimal weight will improve Outcome: Progressing   Problem: Skin Integrity: Goal: Risk for impaired skin integrity will decrease Outcome: Progressing   Problem: Tissue Perfusion: Goal: Adequacy of tissue perfusion will improve Outcome: Progressing   Problem: Education: Goal: Knowledge of General Education information will improve Description: Including pain rating scale, medication(s)/side effects and non-pharmacologic comfort measures Outcome: Progressing   Problem: Health Behavior/Discharge Planning: Goal: Ability to manage health-related needs will improve Outcome: Progressing   Problem: Clinical Measurements: Goal: Ability to maintain clinical measurements within normal limits will improve Outcome: Progressing Goal: Will remain free from infection Outcome: Progressing Goal: Diagnostic test results will improve Outcome: Progressing Goal: Respiratory complications will improve Outcome: Progressing Goal: Cardiovascular complication will be avoided Outcome: Progressing   Problem: Activity: Goal: Risk for activity intolerance will decrease Outcome: Progressing   Problem: Nutrition: Goal: Adequate nutrition will be maintained Outcome: Progressing   Problem: Coping: Goal: Level of anxiety will decrease Outcome: Progressing   Problem: Elimination: Goal: Will not experience complications related to bowel motility Outcome: Progressing Goal: Will not experience complications related to urinary retention Outcome: Progressing   Problem: Pain Managment: Goal: General experience of comfort will improve and/or be  controlled Outcome: Progressing   Problem: Safety: Goal: Ability to remain free from injury will improve Outcome: Progressing   Problem: Skin Integrity: Goal: Risk for impaired skin integrity will decrease Outcome: Progressing   Problem: Education: Goal: Understanding of CV disease, CV risk reduction, and recovery process will improve Outcome: Progressing Goal: Individualized Educational Video(s) Outcome: Progressing   Problem: Activity: Goal: Ability to return to baseline activity level will improve Outcome: Progressing   Problem: Cardiovascular: Goal: Ability to achieve and maintain adequate cardiovascular perfusion will improve Outcome: Progressing Goal: Vascular access site(s) Level 0-1 will be maintained  Outcome: Progressing   Problem: Health Behavior/Discharge Planning: Goal: Ability to safely manage health-related needs after discharge will improve Outcome: Progressing

## 2024-10-02 NOTE — Progress Notes (Signed)
 Occupational Therapy Treatment Patient Details Name: Ashlee Gordon MRN: [id number] DOB: 21-Feb-1971 Today's Date: 10/02/2024   History of present illness [name] adm 09/27/24 with sharp HA and emesis at [facility], CT (+) SAH, transfer to [facility] . CT angiogram Large aneurysm; 12/5 intubated for coiling embolization; extubated 12/6. PMHx: HTN, morbid obesity, prediabetes, depression, fibromyalgia, OA   OT comments  Pt in recliner, seen for OT/PT for safety due to lethargy.  Pt requires maximal encouragement to participate, educated on importance of mobility to decrease risk of deconditioning.  Contact guard to stand from recliner, but needing up to mod assist +2 and very unsteady/reports feeling like she was going to fall when ambulating to/from bathroom.  She is able to stand at sink with contact guard assist to wash face.  Does require cueing to keep eyes open throughout session. Reports feeling weaker than normal, but thinking normally- question this though as scored 13/28 on short blessed test (significant impairments in recall mostly).  Will continue to follow acutely, and continue to anticipate she will progress well acutely if she mobilizes more often.         If plan is discharge home, recommend the following:  Help with stairs or ramp for entrance;Assist for transportation;Assistance with cooking/housework;A lot of help with walking and/or transfers;A lot of help with bathing/dressing/bathroom;Direct supervision/assist for financial management;Direct supervision/assist for medications management   Equipment Recommendations  Other (comment);BSC/3in1 (RW- bari, bari [facility])    Recommendations for Other Services      Precautions / Restrictions Precautions Precautions: Other (comment) Recall of Precautions/Restrictions: Impaired Precaution/Restrictions Comments: SBP <170 Restrictions Weight Bearing Restrictions Per Provider Order: No       Mobility Bed Mobility                General bed mobility comments: OOB in recliner    Transfers Overall transfer level: Needs assistance Equipment used: None Transfers: Sit to/from Stand Sit to Stand: Contact guard assist           General transfer comment: CGA for safety with pt reaching out for single UE support     Balance Overall balance assessment: Needs assistance Sitting-balance support: No upper extremity supported, Feet supported Sitting balance-Leahy Scale: Fair     Standing balance support: Bilateral upper extremity supported, During functional activity Standing balance-Leahy Scale: Poor Standing balance comment: relies on BUE support                           ADL either performed or assessed with clinical judgement   ADL Overall ADL's : Needs assistance/impaired     Grooming: Contact guard assist;Standing;Wash/dry face               Lower Body Dressing: Moderate assistance;Sit to/from stand   Toilet Transfer: Moderate assistance;+2 for physical assistance;Ambulation Toilet Transfer Details (indicate cue type and reason): cga for transfer into standing but mod assist +2 to amb to/from bathroom         Functional mobility during ADLs: Moderate assistance;+2 for physical assistance      Extremity/Trunk Assessment              Vision   Additional Comments: requires cueing to keep eyes open during session, esp during mobility   Perception     Praxis     Communication Communication Communication: No apparent difficulties   Cognition Arousal: Lethargic Behavior During Therapy: Flat affect Cognition: Cognition impaired       Memory impairment (  select all impairments): Working civil service fast streamer, Emergency Planning/management Officer functioning impairment (select all impairments): Problem solving OT - Cognition Comments: pt scoring 13/28 on short blessed test with deficits in recall, time (reports 3pm at 12:30) and sequencing.  Pt reports feeling mentally normal but not  physically.                 Following commands: Impaired Following commands impaired: Follows one step commands with increased time      Cueing   Cueing Techniques: Verbal cues, Tactile cues, Visual cues  Exercises      Shoulder Instructions       General Comments BP stable, pt reprots feeling like she is going to fall when exiting bathroom. needing 2L to sustain O2 >90%, on RA SpO2 87%    Pertinent Vitals/ Pain       Pain Assessment Pain Assessment: Faces Faces Pain Scale: Hurts even more Pain Location: head Pain Descriptors / Indicators: Headache Pain Intervention(s): Limited activity within patient's tolerance, Monitored during session, Repositioned, Premedicated before session  Home Living                                          Prior Functioning/Environment              Frequency  Min 2X/week        Progress Toward Goals  OT Goals(current goals can now be found in the care plan section)  Progress towards OT goals: Not progressing toward goals - comment (increased lethragy and unsteadiness on feet)  Acute Rehab OT Goals Patient Stated Goal: home OT Goal Formulation: With patient Time For Goal Achievement: 10/14/24 Potential to Achieve Goals: Fair  Plan      Co-evaluation    PT/OT/SLP Co-Evaluation/Treatment: Yes Reason for Co-Treatment: For patient/therapist safety;To address functional/ADL transfers;Necessary to address cognition/behavior during functional activity PT goals addressed during session: Mobility/safety with mobility OT goals addressed during session: ADL's and self-care      AM-PAC OT 6 Clicks Daily Activity     Outcome Measure   Help from another person eating meals?: None Help from another person taking care of personal grooming?: A Little Help from another person toileting, which includes using toliet, bedpan, or urinal?: A Lot Help from another person bathing (including washing, rinsing, drying)?:  A Lot Help from another person to put on and taking off regular upper body clothing?: A Little Help from another person to put on and taking off regular lower body clothing?: A Lot 6 Click Score: 16    End of Session Equipment Utilized During Treatment: Oxygen  OT Visit Diagnosis: Other abnormalities of gait and mobility (R26.89);Pain Pain - part of body:  (head)   Activity Tolerance Patient limited by fatigue   Patient Left in chair;with call bell/phone within reach;with chair alarm set   Nurse Communication Mobility status;Precautions        Time: 8774-8751 OT Time Calculation (min): 23 min  Charges: OT General Charges $OT Visit: 1 Visit OT Treatments $Self Care/Home Management : 8-22 mins  Etta NOVAK, OT Acute Rehabilitation Services Office [phone] Secure Chat Preferred    Etta GORMAN Hope 10/02/2024, 1:46 PM

## 2024-10-02 NOTE — Plan of Care (Signed)
 Problem: Education: Goal: Knowledge of disease or condition will improve Outcome: Progressing Goal: Knowledge of secondary prevention will improve (MUST DOCUMENT ALL) Outcome: Progressing Goal: Knowledge of patient specific risk factors will improve (DELETE if not current risk factor) Outcome: Progressing   Problem: Spontaneous Subarachnoid Hemorrhage Tissue Perfusion: Goal: Complications of Spontaneous Subarachnoid Hemorrhage will be minimized Outcome: Progressing   Problem: Coping: Goal: Will verbalize positive feelings about self Outcome: Progressing Goal: Will identify appropriate support needs Outcome: Progressing   Problem: Health Behavior/Discharge Planning: Goal: Ability to manage health-related needs will improve Outcome: Not Progressing Goal: Goals will be collaboratively established with patient/family Outcome: Not Progressing   Problem: Self-Care: Goal: Ability to participate in self-care as condition permits will improve Outcome: Not Progressing Goal: Verbalization of feelings and concerns over difficulty with self-care will improve Outcome: Not Progressing Goal: Ability to communicate needs accurately will improve Outcome: Progressing   Problem: Nutrition: Goal: Risk of aspiration will decrease Outcome: Not Progressing Goal: Dietary intake will improve Outcome: Not Progressing   Problem: Education: Goal: Ability to describe self-care measures that may prevent or decrease complications (Diabetes Survival Skills Education) will improve Outcome: Not Progressing Goal: Individualized Educational Video(s) Outcome: Not Progressing   Problem: Coping: Goal: Ability to adjust to condition or change in health will improve Outcome: Not Progressing   Problem: Fluid Volume: Goal: Ability to maintain a balanced intake and output will improve Outcome: Progressing   Problem: Health Behavior/Discharge Planning: Goal: Ability to identify and utilize available  resources and services will improve Outcome: Not Progressing Goal: Ability to manage health-related needs will improve Outcome: Not Progressing   Problem: Metabolic: Goal: Ability to maintain appropriate glucose levels will improve Outcome: Progressing   Problem: Nutritional: Goal: Maintenance of adequate nutrition will improve Outcome: Not Progressing Goal: Progress toward achieving an optimal weight will improve Outcome: Not Progressing   Problem: Skin Integrity: Goal: Risk for impaired skin integrity will decrease Outcome: Not Progressing   Problem: Tissue Perfusion: Goal: Adequacy of tissue perfusion will improve Outcome: Progressing   Problem: Education: Goal: Knowledge of General Education information will improve Description: Including pain rating scale, medication(s)/side effects and non-pharmacologic comfort measures Outcome: Not Progressing   Problem: Health Behavior/Discharge Planning: Goal: Ability to manage health-related needs will improve Outcome: Not Progressing   Problem: Clinical Measurements: Goal: Ability to maintain clinical measurements within normal limits will improve Outcome: Progressing Goal: Will remain free from infection Outcome: Progressing Goal: Diagnostic test results will improve Outcome: Progressing Goal: Respiratory complications will improve Outcome: Not Progressing Goal: Cardiovascular complication will be avoided Outcome: Progressing   Problem: Activity: Goal: Risk for activity intolerance will decrease Outcome: Not Progressing   Problem: Nutrition: Goal: Adequate nutrition will be maintained Outcome: Not Progressing   Problem: Coping: Goal: Level of anxiety will decrease Outcome: Not Progressing   Problem: Elimination: Goal: Will not experience complications related to bowel motility Outcome: Progressing Goal: Will not experience complications related to urinary retention Outcome: Progressing   Problem: Pain  Managment: Goal: General experience of comfort will improve and/or be controlled Outcome: Not Progressing   Problem: Safety: Goal: Ability to remain free from injury will improve Outcome: Not Progressing   Problem: Skin Integrity: Goal: Risk for impaired skin integrity will decrease Outcome: Progressing   Problem: Education: Goal: Understanding of CV disease, CV risk reduction, and recovery process will improve Outcome: Not Progressing Goal: Individualized Educational Video(s) Outcome: Not Progressing   Problem: Activity: Goal: Ability to return to baseline activity level will improve Outcome: Not Progressing  Problem: Cardiovascular: Goal: Ability to achieve and maintain adequate cardiovascular perfusion will improve Outcome: Progressing Goal: Vascular access site(s) Level 0-1 will be maintained Outcome: Progressing   Problem: Health Behavior/Discharge Planning: Goal: Ability to safely manage health-related needs after discharge will improve Outcome: Not Progressing

## 2024-10-02 NOTE — Progress Notes (Signed)
 Physical Therapy Treatment Patient Details Name: Ashlee Gordon MRN: [id number] DOB: 1970-12-16 Today's Date: 10/02/2024   History of Present Illness [name] adm 09/27/24 with sharp HA and emesis at [facility], CT (+) SAH, transfer to [facility] . CT angiogram Large aneurysm; 12/5 intubated for coiling embolization; extubated 12/6. PMHx: HTN, morbid obesity, prediabetes, depression, fibromyalgia, OA    PT Comments  The pt presents with continued reports of headache and fatigue, but agreeable to limited session with encouragement. The pt demos increased instability, reaching for BUE support during short walk to bathroom, and needing up to modA of 2 to steady while chair pulled due to sudden onset of increased instability and pt reports I feel like I am falling when ambulating back from bathroom. VSS on 2L O2 during ambulation and BP stable both before and after mobility. Pt denies any other sx. Will need continued skilled PT acutely to progress functional independence and endurance with mobility prior to anticipated return home.    If plan is discharge home, recommend the following: Assist for transportation;Help with stairs or ramp for entrance   Can travel by private vehicle        Equipment Recommendations  None recommended by PT    Recommendations for Other Services       Precautions / Restrictions Precautions Precautions: Other (comment) Recall of Precautions/Restrictions: Impaired Precaution/Restrictions Comments: SBP <170, watch SpO2 on 2L this session Restrictions Weight Bearing Restrictions Per Provider Order: No     Mobility  Bed Mobility               General bed mobility comments: OOB in recliner    Transfers Overall transfer level: Needs assistance Equipment used: None Transfers: Sit to/from Stand Sit to Stand: Contact guard assist           General transfer comment: CGA for safety with pt reaching out for single UE support     Ambulation/Gait Ambulation/Gait assistance: Mod assist, +2 physical assistance Gait Distance (Feet): 8 Feet (+ 5 ft) Assistive device: 1 person hand held assist Gait Pattern/deviations: Step-through pattern, Decreased stride length, Wide base of support Gait velocity: decreased Gait velocity interpretation: <1.31 ft/sec, indicative of household ambulator   General Gait Details: increased sway and reaching with BUE for walls and support. while walking back to chair pt with significantly increased sway needing modA of 2 and reports I think I am falling prompting PT to pull recliner for pt to sit. VSS on2L during incident, pt unable to give any further information     Balance Overall balance assessment: Needs assistance Sitting-balance support: No upper extremity supported, Feet supported Sitting balance-Leahy Scale: Fair     Standing balance support: Bilateral upper extremity supported, During functional activity Standing balance-Leahy Scale: Poor Standing balance comment: relies on BUE support                            Communication Communication Communication: No apparent difficulties  Cognition Arousal: Lethargic Behavior During Therapy: Flat affect   PT - Cognitive impairments: Awareness, Memory, Attention, Initiation, Safety/Judgement                       PT - Cognition Comments: pt with poor initiation of safety measures, limited motivation and awareness of defcitis. Per RN staff pt refusing self-care such as cleaning after toileting in the bed. Pt unable to recall information given by OT during session, and demos limited advanced warning of  feeling poorly when mobilizing Following commands: Impaired Following commands impaired: Follows one step commands with increased time    Cueing Cueing Techniques: Verbal cues, Tactile cues, Visual cues  Exercises      General Comments General comments (skin integrity, edema, etc.): BP stable, pt reprots  feeling like she is going to fall when exiting bathroom. needing 2L to sustain O2 >90%, on RA SpO2 87%      Pertinent Vitals/Pain Pain Assessment Pain Assessment: Faces Faces Pain Scale: Hurts even more Pain Location: head Pain Descriptors / Indicators: Headache Pain Intervention(s): Limited activity within patient's tolerance, Monitored during session, Repositioned     PT Goals (current goals can now be found in the care plan section) Acute Rehab PT Goals Patient Stated Goal: headache to go away PT Goal Formulation: With patient Time For Goal Achievement: 10/13/24 Potential to Achieve Goals: Good Progress towards PT goals: Progressing toward goals    Frequency    Min 2X/week      PT Plan      Co-evaluation   Reason for Co-Treatment: For patient/therapist safety;To address functional/ADL transfers;Necessary to address cognition/behavior during functional activity PT goals addressed during session: Mobility/safety with mobility OT goals addressed during session: ADL's and self-care      AM-PAC PT 6 Clicks Mobility   Outcome Measure  Help needed turning from your back to your side while in a flat bed without using bedrails?: None Help needed moving from lying on your back to sitting on the side of a flat bed without using bedrails?: A Little Help needed moving to and from a bed to a chair (including a wheelchair)?: A Little Help needed standing up from a chair using your arms (e.g., wheelchair or bedside chair)?: A Little Help needed to walk in hospital room?: Total (<20 ft) Help needed climbing 3-5 steps with a railing? : Total 6 Click Score: 15    End of Session Equipment Utilized During Treatment: Oxygen Activity Tolerance: Patient limited by pain Patient left: in chair;with call bell/phone within reach;with chair alarm set Nurse Communication: Mobility status PT Visit Diagnosis: Difficulty in walking, not elsewhere classified (R26.2);Other symptoms and  signs involving the nervous system (R29.898)     Time: 8774-8749 PT Time Calculation (min) (ACUTE ONLY): 25 min  Charges:    $Therapeutic Exercise: 8-22 mins PT General Charges $$ ACUTE PT VISIT: 1 Visit                     Izetta Call, PT, DPT   Acute Rehabilitation Department Office [phone] Secure Chat Communication Preferred   Izetta JULIANNA Call 10/02/2024, 2:12 PM

## 2024-10-03 LAB — CBC
HCT: 37.7 % (ref 36.0–46.0)
Hemoglobin: 11.8 g/dL — ABNORMAL LOW (ref 12.0–15.0)
MCH: 27.1 pg (ref 26.0–34.0)
MCHC: 31.3 g/dL (ref 30.0–36.0)
MCV: 86.5 fL (ref 80.0–100.0)
Platelets: 257 K/uL (ref 150–400)
RBC: 4.36 MIL/uL (ref 3.87–5.11)
RDW: 14.5 % (ref 11.5–15.5)
WBC: 13.9 K/uL — ABNORMAL HIGH (ref 4.0–10.5)
nRBC: 0 % (ref 0.0–0.2)

## 2024-10-03 LAB — BASIC METABOLIC PANEL WITH GFR
Anion gap: 6 (ref 5–15)
BUN: 12 mg/dL (ref 6–20)
CO2: 27 mmol/L (ref 22–32)
Calcium: 7.9 mg/dL — ABNORMAL LOW (ref 8.9–10.3)
Chloride: 105 mmol/L (ref 98–111)
Creatinine, Ser: 1.02 mg/dL — ABNORMAL HIGH (ref 0.44–1.00)
GFR, Estimated: >60 mL/min (ref >60)
Glucose, Bld: 128 mg/dL — ABNORMAL HIGH (ref 70–99)
Potassium: 3.6 mmol/L (ref 3.5–5.1)
Sodium: 138 mmol/L (ref 135–145)

## 2024-10-03 LAB — MAGNESIUM: Magnesium: 2 mg/dL (ref 1.7–2.4)

## 2024-10-03 LAB — SODIUM: Sodium: 138 mmol/L (ref 135–145)

## 2024-10-03 LAB — PHOSPHORUS: Phosphorus: 4 mg/dL (ref 2.5–4.6)

## 2024-10-03 MED ORDER — GLUCERNA SHAKE PO LIQD
237.0000 mL | Freq: Three times a day (TID) | ORAL | Status: DC
  Administered 2024-10-03 – 2024-10-09 (×5): 237 mL via ORAL

## 2024-10-03 MED ORDER — ADULT MULTIVITAMIN W/MINERALS CH
1.0000 | ORAL_TABLET | Freq: Every day | ORAL | Status: DC
  Administered 2024-10-03 – 2024-10-12 (×10): 1 via ORAL
  Filled 2024-10-03 (×10): qty 1

## 2024-10-03 NOTE — Progress Notes (Signed)
 NAME:  Ashlee Gordon, MRN:  [id number], DOB:  05-01-1971, LOS: 6 ADMISSION DATE:  09/27/2024, CONSULTATION DATE:  12/5  REFERRING MD: Pamella CHIEF COMPLAINT:  Severe Headache   History of Present Illness:  Patient is a 53 year old female with significant past medical history of sleep apnea, hypertension, obesity, prediabetes, depression, fibromyalgia, anemia, and osteoarthritis who presented to [facility] ED after a sudden onset of a severe headache approximately around 0 530 on 12/5.  Per report, severe headache occurred while her sister and boyfriend were arguing at the residence.  Patient was immediately transported to CT scan-for stat head showing concerns of a acute subarachnoid hemorrhage, aneurysmal rupture, mild ventriculomegaly, no intraventricular hemorrhage and no midline shift.  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  Neurovascular was consulted and requested patient be transferred to [facility], ED for further neuro eval and and possible IR intervention.  Patient was started on Cleviprex  due to hypertension.  Upon arrival, patient is nauseous and also drowsy but not having no focal neuro deficits. PCCM consulted for admission to 4N ICU after endovascular intervention- aneurysm coiling and possible EVD placement.   Upon ED assessment, patient drowsy, still complaining of headache. Patient able to state name, and oriented to place but not able to state time. Patient has no focal neuro deficits at this time, able to move all extremities but does have generalized weakness. Patient on cleviprex  at this time with ongoing titration.   Pertinent  Medical History   Past Medical History:  Diagnosis Date   Anemia    Back pain    Depression    Fibromyalgia    Hypertension    Infertility, female    Joint pain    Osteoarthritis    Osteoarthritis    Prediabetes    Sleep apnea    SOBOE  (shortness of breath on exertion)    Swelling of both lower extremities    Vitamin D  deficiency      Significant Hospital Events: Including procedures, antibiotic start and stop dates in addition to other pertinent events   12/5 PCCM admit- [facility]H due to Left ICA aneurysm, plan for coiling by NIR, eval for EVD 12/6 Extubated 12/7 art line x 2 then a 3rd overnight (prior ones malfunctioned) 12/8 significant headache 8/10 scale. Added gabapentin , Fioricet , PRN fentanyl . Also asking to try CPAP while in hospital. New SBP goal of < 200 per Dr. Lester 12/8 CT head with mild improvement in [facility]  Interim History / Subjective:  NAEON. Still has headache. Per NSGY notes, pt has been a bit challenging with care and refusing certain things etc.  Objective    Blood pressure (!) 142/71, pulse 68, temperature 98.4 F (36.9 C), temperature source Oral, resp. rate (!) 22, height 5' 7.99 (1.727 m), weight (!) 205.6 kg, last menstrual period 10/22/2020, SpO2 98%.        Intake/Output Summary (Last 24 hours) at 10/03/2024 0921 Last data filed at 10/03/2024 0800 Gross per 24 hour  Intake 3747.24 ml  Output --  Net 3747.24 ml   Filed Weights   09/27/24 1345 09/28/24 0315  Weight: (!) 200 kg (!) 205.6 kg   Physical Exam: General: Adult female, resting in bed, in NAD. Neuro: A&O x 3, no true deficits. HEENT: [place]/AT. Sclerae anicteric. EOMI. Cardiovascular: RRR, no M/R/G.  Lungs: Respirations even and unlabored.  CTA bilaterally, No W/R/R. Abdomen: Morbidly obese, BS x 4,  soft, NT/ND.  Musculoskeletal: No gross deformities, no edema.   Assessment and Plan   Subarachnoid Hemorrhage (H/H 3) due to Left ICA Aneurysm  - s/p balloon assisted coiling 12/5; tortuous irregular walled vessel noted Ongoing headache - 2/2 above Refractory HTN - off Cleviprex  12/10 Sleep Apnea hx - 2/2 obesity. Trying CPAP at bedtime per request, needs OP sleep referrral Hx Depression, Fibromyalgia on PTA Bupropion . Class 3  obesity Prediabetes - Hgb A1c 6.1. Polyuria - not documented recently, query resolved  Keep euvolemic and eunatremic, follow Na BID Continue LR at 75 TCDs MWF per neuro IR Continue nimodipine  and vasospasm watch Continue PO antihypertensives Continue nocturnal CPAP (if she will comply) Needs to mobilize Needs diet counseling and weight reduction    Sammi Gore, PA - C  Pulmonary & Critical Care Medicine For pager details, please see AMION or use Epic chat  After 1900, please call ELINK for cross coverage needs 10/03/2024, 9:26 AM

## 2024-10-03 NOTE — Progress Notes (Signed)
 Physical Therapy Treatment Patient Details Name: Ashlee Gordon MRN: [id number] DOB: Dec 11, 1970 Today's Date: 10/03/2024   History of Present Illness [name] adm 09/27/24 with sharp HA and emesis at [facility], CT (+) SAH, transfer to [facility] . CT angiogram Large aneurysm; 12/5 intubated for coiling embolization; extubated 12/6. PMHx: HTN, morbid obesity, prediabetes, depression, fibromyalgia, OA    PT Comments  Pt seen in session with OT to progress OOB mobility. Pt required min assist bed mobility, CGA +2 safety transfers, and min assist +2 safety amb 8' x 2 using IV pole for support. Mobility limited by pain (headache). Pt frequently closing her eyes as pt reports that helps manage the headache. Pt sitting EOB at end of session for nursing to assist with bath. PT to continue per current POC.     If plan is discharge home, recommend the following: Assist for transportation;Help with stairs or ramp for entrance   Can travel by private vehicle        Equipment Recommendations  None recommended by PT    Recommendations for Other Services       Precautions / Restrictions Precautions Precautions: Other (comment) Precaution/Restrictions Comments: SBP <170, watch SpO2     Mobility  Bed Mobility Overal bed mobility: Needs Assistance (Simultaneous filing. User may not have seen previous data.) Bed Mobility: Supine to Sit (Simultaneous filing. User may not have seen previous data.)     Supine to sit: Min assist, HOB elevated, Used rails (Simultaneous filing. User may not have seen previous data.)     General bed mobility comments: light min assist to elevate trunk, increased time (Simultaneous filing. User may not have seen previous data.)    Transfers Overall transfer level: Needs assistance (Simultaneous filing. User may not have seen previous data.) Equipment used: Ambulation equipment used (Simultaneous filing. User may not have seen previous data.) Transfers: Sit to/from Stand  (Simultaneous filing. User may not have seen previous data.) Sit to Stand: Contact guard assist, +2 safety/equipment (Simultaneous filing. User may not have seen previous data.)           General transfer comment: increased time to initiate (Simultaneous filing. User may not have seen previous data.)    Ambulation/Gait Ambulation/Gait assistance: Min assist, +2 safety/equipment Gait Distance (Feet): 8 Feet (x 2) Assistive device: IV Pole Gait Pattern/deviations: Step-through pattern, Decreased stride length, Wide base of support Gait velocity: decreased     General Gait Details: Pt ambulated to/from bathroom. Seated rest break on toilet to urinate between gait trials.   Stairs             Wheelchair Mobility     Tilt Bed    Modified Rankin (Stroke Patients Only) Modified Rankin (Stroke Patients Only) Pre-Morbid Rankin Score: Slight disability Modified Rankin: Moderate disability     Balance Overall balance assessment: Needs assistance Sitting-balance support: No upper extremity supported, Feet supported Sitting balance-Leahy Scale: Fair     Standing balance support: Bilateral upper extremity supported, During functional activity Standing balance-Leahy Scale: Poor                              Communication Communication Communication: No apparent difficulties  Cognition Arousal: Alert Behavior During Therapy: Flat affect   PT - Cognitive impairments: Safety/Judgement                         Following commands: Intact  Cueing    Exercises      General Comments General comments (skin integrity, edema, etc.): SBP 120-145, SpO2 90-93% on RA. Remained on RA at end of session.      Pertinent Vitals/Pain Pain Assessment Pain Assessment: Faces Faces Pain Scale: Hurts even more Pain Location: head Pain Descriptors / Indicators: Headache Pain Intervention(s): Limited activity within patient's tolerance, Monitored during  session    Home Living                          Prior Function            PT Goals (current goals can now be found in the care plan section) Acute Rehab PT Goals Patient Stated Goal: decrease headache Progress towards PT goals: Progressing toward goals    Frequency    Min 2X/week      PT Plan      Co-evaluation PT/OT/SLP Co-Evaluation/Treatment: Yes Reason for Co-Treatment: For patient/therapist safety;To address functional/ADL transfers;Necessary to address cognition/behavior during functional activity PT goals addressed during session: Mobility/safety with mobility;Balance        AM-PAC PT 6 Clicks Mobility   Outcome Measure  Help needed turning from your back to your side while in a flat bed without using bedrails?: None Help needed moving from lying on your back to sitting on the side of a flat bed without using bedrails?: A Little Help needed moving to and from a bed to a chair (including a wheelchair)?: A Little Help needed standing up from a chair using your arms (e.g., wheelchair or bedside chair)?: A Little Help needed to walk in hospital room?: Total Help needed climbing 3-5 steps with a railing? : Total 6 Click Score: 15    End of Session Equipment Utilized During Treatment: Gait belt Activity Tolerance: Patient tolerated treatment well Patient left: in bed;with nursing/sitter in room;with call bell/phone within reach Nurse Communication: Mobility status PT Visit Diagnosis: Difficulty in walking, not elsewhere classified (R26.2);Other symptoms and signs involving the nervous system (R29.898)     Time: 8984-8955 PT Time Calculation (min) (ACUTE ONLY): 29 min  Charges:    $Therapeutic Activity: 8-22 mins PT General Charges $$ ACUTE PT VISIT: 1 Visit                     Sari MATSU., PT  Office # [phone]    Erven Sari Shaker 10/03/2024, 11:44 AM

## 2024-10-03 NOTE — Progress Notes (Signed)
 History  Patient is a 53 year old female with significant past medical history of sleep apnea, hypertension, obesity, prediabetes, depression, fibromyalgia, anemia, and osteoarthritis who presented to [facility] ED after a sudden onset of a severe headache approximately around 0 530 on 12/5. Per report, severe headache occurred while her sister and boyfriend were arguing at the residence. Patient was immediately transported to CT scan-for stat head showing concerns of a acute subarachnoid hemorrhage, aneurysmal rupture, mild ventriculomegaly, no intraventricular hemorrhage and no midline shift. 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 Neurovascular was consulted and requested patient be transferred to [facility], ED for further neuro eval and and possible IR intervention. Patient was started on Cleviprex  due to hypertension. Upon arrival, patient was nauseous and also drowsy but not having no focal neuro deficits. PCCM was consulted for admission to 4N ICU after endovascular intervention- aneurysm coiling  12/08 CT head revealed mild improvement of SAH primarily within the interhemispheric fissure. No convincing evidence of cerebral infarction.   12/10 @7 :30 Nurse report the patient is highly non-compliant with nursing care and neuro checks. I did not experience this during my assessment; however, this was affirmed by multiple nurses. No change in patient condition or significant event overnight  Assessment/Plan: The patient demonstrates appropriate postoperative recovery of her SAH with an intact neurological exam and no new deficits. There are no signs of infection or complication at this time.   LOS: 1 day  Continue to monitor the patient and continue with attempting neuro checks   Subjective: Patient reports headache that has not changed since the last assessment. Tylenol  is providing mild  relief for about an hour before it starts to wear off. She denies any other concerns at this time.   Objective: Vital signs in last 24 hours: Temp:  [99.3 F (37.4 C)-100.7 F (38.2 C)] 99.6 F (37.6 C) (12/11 0500) Pulse Rate:  [67-85] 85 (12/11 0700) Resp:  [18-26] 20 (12/11 0700) BP: (91-168)/(46-105) 167/82 (12/11 0700) SpO2:  [87 %-98 %] 97 % (12/11 0700) Arterial Line BP: (130-168)/(71-95) 130/71 (12/10 0900)  Intake/Output from previous day: 12/10 0701 - 12/11 0700 In: 3358.5 [P.O.:1380; I.V.:978.3; IV Piggyback:1000.3] Out: -  Intake/Output this shift: Total I/O In: 75 [I.V.:75] Out: -   PHYSICAL EXAM  HENT:     Head: Normocephalic.     Nose: Nose normal.  Eyes:     Pupils: Pupils are equal, round, and reactive to light.  Cardiovascular:     Rate and Rhythm: Normal rate.  Pulmonary:     Effort: Pulmonary effort is normal.  Abdominal:     General: Abdomen is flat.  Musculoskeletal:     Cervical back: Normal range of motion.  Neurological:     Mental Status: Patient is alert and oriented.        Cranial Nerves: Cranial nerves 2-12 are intact.     Sensory: Sensation is intact.     Motor: Motor function is intact.    Coordination: Coordination is intact.  Skin:     Skin is intact    Lab Results: Recent Labs    10/02/24 0346 10/03/24 0200  WBC 14.7* 13.9*  HGB 11.8* 11.8*  HCT 37.3 37.7  PLT 229 257   BMET Recent Labs    10/02/24 0346 10/02/24 1804 10/03/24 0200  NA 138 142 138  K 4.6  --  3.6  CL 105  --  105  CO2 26  --  27  GLUCOSE 110*  --  128*  BUN 12  --  12  CREATININE 1.05*  --  1.02*  CALCIUM 7.9*  --  7.9*    Studies/Results: VAS US  TRANSCRANIAL DOPPLER Result Date: 10/01/2024  Transcranial Doppler Patient Name:  Ashlee Gordon  Date of Exam:   10/01/2024 Medical Rec #: [id number]      Accession #:    [id number] Date of Birth: 05-24-1971     Patient Gender: F Patient Age:   59 years Exam Location:  [facility] Procedure:       VAS US  TRANSCRANIAL DOPPLER Referring Phys: FONDA SHARPS --------------------------------------------------------------------------------  Indications: Subarachnoid hemorrhage. 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 S/p balloon assisted coiling.  Performing Technologist: Ricka Sturdivant-Jones RDMS, RVT  Examination Guidelines: A complete evaluation includes B-mode imaging, spectral Doppler, color Doppler, and power Doppler as needed of all accessible portions of each vessel. Bilateral testing is considered an integral part of a complete examination. Limited examinations for reoccurring indications may be performed as noted.  +----------+---------------+----------+-----------+-------+ RIGHT TCD Right VM (cm/s)Depth (cm)PulsatilityComment +----------+---------------+----------+-----------+-------+ MCA             96          1.14      1.15            +----------+---------------+----------+-----------+-------+ ACA            -101         6.40      0.87            +----------+---------------+----------+-----------+-------+ Term ICA        67                    0.86            +----------+---------------+----------+-----------+-------+ PCA P1          22                    0.79            +----------+---------------+----------+-----------+-------+ Opthalmic       21                    1.30            +----------+---------------+----------+-----------+-------+ ICA siphon      26                    0.93            +----------+---------------+----------+-----------+-------+ Vertebral       -29                   0.93            +----------+---------------+----------+-----------+-------+ Distal ICA      27                    0.93            +----------+---------------+----------+-----------+-------+  +----------+--------------+----------+-----------+-------+ LEFT TCD  Left VM (cm/s)Depth (cm)PulsatilityComment  +----------+--------------+----------+-----------+-------+ MCA            101         4.90      1.04            +----------+--------------+----------+-----------+-------+ ACA            -40  0.94            +----------+--------------+----------+-----------+-------+ Term ICA        46                   1.20            +----------+--------------+----------+-----------+-------+ PCA P1          38                   1.09            +----------+--------------+----------+-----------+-------+ Opthalmic       26                   1.45            +----------+--------------+----------+-----------+-------+ ICA siphon      46                   0.98            +----------+--------------+----------+-----------+-------+ Vertebral      -35                   0.96            +----------+--------------+----------+-----------+-------+ Distal ICA      34                   1.03            +----------+--------------+----------+-----------+-------+  +------------+-------+-------------+             VM cm/s   Comment    +------------+-------+-------------+ Prox Basilar  -53                +------------+-------+-------------+ Dist Basilar       not insonated +------------+-------+-------------+    Preliminary      Jayson PARAS Lynnley Doddridge 10/03/2024, 7:38 AM

## 2024-10-03 NOTE — Progress Notes (Signed)
 Initial Nutrition Assessment  DOCUMENTATION CODES:  Morbid obesity  INTERVENTION:  Provided patient with Low-Sodium Nutrition Therapy handout by [employer]. Reinforcement needed. Glucerna Shake PO TID. Each supplement provides 220 Kcals and 10 grams of protein. Multivitamin PO once daily. Continue carb-modified diet.  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 PMH significant for prediabetes, HTN, Vit D deficiency, fibromyalgia, osteoarthritis, and sleep apnea.  12/05 admitted, balloon-assisted coiling, tortuous irregular walled vessel noted. 12/06 extubated 12/08 CT head shows mild improvement in [facility] 12/10 off Cleviprex   Visited the patient who was rather lethargic. She states that she has not been eating well because she is feeling very tired. She appeared labored when speaking and was very limited in her responses. Provided low-sodium diet handout and brief instruction. She is amenable to ONS TID. She tells me her UBW is 423 lbs and it has been stable. She was eating well PTA.  Scheduled Meds:  amLODipine   10 mg Oral Daily   aspirin   81 mg Oral Daily   buPROPion   150 mg Oral Daily   carvedilol   25 mg Oral BID WC   Chlorhexidine  Gluconate Cloth  6 each Topical Daily   gabapentin   300 mg Oral TID   heparin  injection (subcutaneous)  5,000 Units Subcutaneous Q8H   hydrALAZINE   100 mg Oral Q8H   levETIRAcetam   500 mg Oral BID   losartan   50 mg Oral Daily   niMODipine   60 mg Oral Q4H   Or   niMODipine    60 mg Per Tube Q4H   pantoprazole   40 mg Oral Daily   Or   pantoprazole  (PROTONIX ) IV  40 mg Intravenous Daily   polyethylene glycol  17 g Oral Daily   senna-docusate  1 tablet Oral BID   Continuous Infusions:  sodium chloride  Stopped (10/03/24 1050)   PRN Meds:.butalbital -acetaminophen -caffeine , caffeine , fentaNYL  (SUBLIMAZE ) injection, Gerhardt's butt cream, labetalol , ondansetron  **OR** ondansetron  (ZOFRAN ) IV, mouth rinse, oxyCODONE   Diet Order             Diet Carb Modified Fluid consistency: Thin; Room service appropriate? Yes  Diet effective now                  Meal Intake: 25-100% charted  Labs:     Latest Ref Rng & Units 10/03/2024    2:00 AM 10/02/2024    6:04 PM 10/02/2024    3:46 AM  CMP  Glucose 70 - 99 mg/dL 871   889   BUN 6 - 20 mg/dL 12   12   Creatinine 9.55 - 1.00 mg/dL 8.97   8.94   Sodium 864 - 145 mmol/L 138  142  138   Potassium 3.5 - 5.1 mmol/L 3.6   4.6   Chloride 98 - 111 mmol/L 105   105   CO2 22 - 32 mmol/L 27   26   Calcium 8.9 - 10.3 mg/dL 7.9   7.9      I/O: +2 L since admit  NUTRITION - FOCUSED PHYSICAL EXAM: Flowsheet Row Most Recent Value  Orbital Region No depletion  Upper Arm Region No depletion  Thoracic and Lumbar Region No depletion  Buccal Region No depletion  Temple Region No depletion  Clavicle Bone Region No depletion  Clavicle and Acromion Bone Region Unable to assess  [due to body habitus]  Scapular Bone Region Unable to assess  [due to body habitus]  Dorsal Hand No depletion  Patellar Region Unable to assess  [due to body habitus]  Anterior Thigh Region Unable to assess  [due to body habitus]  Posterior Calf Region Unable to assess  [due to body habitus]  Edema (RD Assessment) Mild  [non-pitting BLE and generalized]  Hair Reviewed  Eyes Reviewed  Mouth Reviewed  Skin Reviewed  Nails Reviewed    EDUCATION NEEDS:  Education needs have been addressed  Skin:  Skin Assessment: Reviewed RN  Assessment  Last BM:  12/11 type 6  Height:  Ht Readings from Last 1 Encounters:  09/27/24 5' 7.99 (1.727 m)   Weight:  Wt Readings from Last 10 Encounters:  09/28/24 (!) 205.6 kg  08/05/24 (!) 200 kg  06/11/24 (!) 192.3 kg  05/29/24 (!) 192.3 kg  04/30/24 (!) 191.4 kg  03/21/24 (!) 191.9 kg  02/05/24 (!) 185.5 kg  01/16/24 (!) 183.3 kg  11/21/23 (!) 183.3 kg  10/24/23 (!) 184.2 kg   Weight Change: gradual weight gain noted the past year  Usual Body Weight: 423 lbs per patient  Edema: non-pitting BLE and generalized  Ideal Body Weight:  64 kg   BMI:  Body mass index is 68.94 kg/m.  Estimated Nutritional Needs:  Kcal:  2000-2200 Protein:  125-150 Fluid:  >/=2000    Leverne Ruth, MS, RDN, LDN [place]. [facility] See AMION for contact information Secure chat preferred

## 2024-10-03 NOTE — Progress Notes (Signed)
 Occupational Therapy Treatment Patient Details Name: Ashlee Gordon MRN: [id number] DOB: Mar 11, 1971 Today's Date: 10/03/2024   History of present illness [name] adm 09/27/24 with sharp HA and emesis at [facility], CT (+) SAH, transfer to [facility] . CT angiogram Large aneurysm; 12/5 intubated for coiling embolization; extubated 12/6. PMHx: HTN, morbid obesity, prediabetes, depression, fibromyalgia, OA   OT comments  Pt supine in bed and agreeable to mobilize with increased encouragement.  Pt limited by headache pain, demonstrates slow processing and decreased problem solving.  Mobilizing with gross contact guard assist with +2 for safety, needing increased time for all tasks.  She is more steady today using IV pole to support self during mobility, but poor tolerance.  Declining to engage in ADLs standing at sink.  On RA with SpO2 90-93%. Will benefit from continued OT services, will follow acutely.       If plan is discharge home, recommend the following:  Help with stairs or ramp for entrance;Assist for transportation;Assistance with cooking/housework;A lot of help with bathing/dressing/bathroom;Direct supervision/assist for financial management;Direct supervision/assist for medications management;A little help with walking and/or transfers   Equipment Recommendations  Other (comment);BSC/3in1 (bari BSC, RW)    Recommendations for Other Services      Precautions / Restrictions Precautions Precautions: Other (comment) Precaution/Restrictions Comments: SBP <170, watch SpO2 Restrictions Weight Bearing Restrictions Per Provider Order: No       Mobility Bed Mobility Overal bed mobility: Needs Assistance Bed Mobility: Supine to Sit     Supine to sit: HOB elevated, Supervision     General bed mobility comments: increased time    Transfers Overall transfer level: Needs assistance Equipment used: None Transfers: Sit to/from Stand Sit to Stand: Contact guard assist            General transfer comment: CGA for safety     Balance Overall balance assessment: Needs assistance Sitting-balance support: No upper extremity supported, Feet supported Sitting balance-Leahy Scale: Fair     Standing balance support: Bilateral upper extremity supported, During functional activity Standing balance-Leahy Scale: Poor Standing balance comment: 1-2 hand support                           ADL either performed or assessed with clinical judgement   ADL Overall ADL's : Needs assistance/impaired     Grooming: Set up;Sitting Grooming Details (indicate cue type and reason): declines to engage in tasks standing at sink             Lower Body Dressing: Moderate assistance;Sit to/from stand Lower Body Dressing Details (indicate cue type and reason): assist to adjust socks before standing Toilet Transfer: Contact guard assist;Ambulation Toilet Transfer Details (indicate cue type and reason): +2 safety, contact guard holding IV pole when mobilizing Toileting- Clothing Manipulation and Hygiene: Contact guard assist;Sit to/from stand       Functional mobility during ADLs: Contact guard assist;+2 for safety/equipment      Extremity/Trunk Assessment              Vision       Perception     Praxis     Communication Communication Communication: No apparent difficulties   Cognition Arousal: Alert Behavior During Therapy: Flat affect Cognition: Cognition impaired       Memory impairment (select all impairments): Short-term memory, Working Biochemist, Clinical functioning impairment (select all impairments): Problem solving OT - Cognition Comments: slow processing, could be personality- needs further cog assessment  Following commands: Intact        Cueing      Exercises      Shoulder Instructions       General Comments BP stable, SpO2 on RA 90-93% today    Pertinent Vitals/ Pain       Pain Assessment Pain  Assessment: Faces Faces Pain Scale: Hurts even more Pain Location: head Pain Descriptors / Indicators: Headache Pain Intervention(s): Limited activity within patient's tolerance, Monitored during session, Repositioned  Home Living                                          Prior Functioning/Environment              Frequency  Min 2X/week        Progress Toward Goals  OT Goals(current goals can now be found in the care plan section)  Progress towards OT goals: Progressing toward goals  Acute Rehab OT Goals Patient Stated Goal: less pain OT Goal Formulation: With patient Time For Goal Achievement: 10/14/24 Potential to Achieve Goals: Fair  Plan      Co-evaluation    PT/OT/SLP Co-Evaluation/Treatment: Yes Reason for Co-Treatment: For patient/therapist safety;To address functional/ADL transfers;Necessary to address cognition/behavior during functional activity PT goals addressed during session: Mobility/safety with mobility;Balance        AM-PAC OT 6 Clicks Daily Activity     Outcome Measure   Help from another person eating meals?: None Help from another person taking care of personal grooming?: A Little Help from another person toileting, which includes using toliet, bedpan, or urinal?: A Little Help from another person bathing (including washing, rinsing, drying)?: A Lot Help from another person to put on and taking off regular upper body clothing?: A Little Help from another person to put on and taking off regular lower body clothing?: A Lot 6 Click Score: 17    End of Session    OT Visit Diagnosis: Other abnormalities of gait and mobility (R26.89);Pain Pain - part of body:  (head)   Activity Tolerance Patient tolerated treatment well   Patient Left with call bell/phone within reach;with bed alarm set;with nursing/sitter in room (sitting EOB)   Nurse Communication Mobility status;Precautions        Time: 8982-8956 OT Time  Calculation (min): 26 min  Charges: OT General Charges $OT Visit: 1 Visit OT Treatments $Self Care/Home Management : 8-22 mins  Etta NOVAK, OT Acute Rehabilitation Services Office [phone] Secure Chat Preferred    Etta GORMAN [place] 10/03/2024, 11:39 AM

## 2024-10-03 NOTE — Progress Notes (Signed)
 History  Patient is a 53 year old female with significant past medical history of sleep apnea, hypertension, obesity, prediabetes, depression, fibromyalgia, anemia, and osteoarthritis who [facility] ED after a sudden onset of a severe headache approximately around 0 530 on 12/5. Per report, severe headache occurred while her sister and boyfriend were arguing at the residence. Patient was immediately transported to CT scan-for stat head showing concerns of a acute subarachnoid hemorrhage, aneurysmal rupture, mild ventriculomegaly, no intraventricular hemorrhage and no midline shift. 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  Neurovascular was consulted and requested patient be transferred to [facility], ED for further neuro eval and and possible IR intervention. Patient was started on Cleviprex  due to hypertension. Upon arrival, patient was nauseous and also drowsy but not having no focal neuro deficits. PCCM was consulted for admission to 4N ICU after endovascular intervention- aneurysm coiling.   12/08 CT head revealed mild improvement of SAH primarily within the interhemispheric fissure. No convincing evidence of cerebral infarction.    12/10 @7 :30 Nurse report the patient is highly non-compliant with nursing care and neuro checks. I did not experience this during my assessment; however, this was affirmed by multiple nurses. No change in patient condition or significant event overnight  12/11 at 0750 Once again I received report that the patient was non-compliant with nursing care and neuro checks last night. I did not experience this in my assessment. OT and PT are working with the patient. No significant events reported last night. Report from nurses indicated that the patient has been ordering 3+ plates of food per meal.   Assessment/Plan: No changes in the assessment of the patient  at this time. The patient demonstrates appropriate postoperative recovery with an intact neurological exam and no deficits. Site is clean and dry, pain is adequately managed with tylenol , and there are no signs of infection or complication at this time. The patient did report a slight painful burning sensation in her right leg with unclear origin. The pain is not reproducible to touch.The skin is intact and without signs of infection. It could possibly have to do with the patient resting the leg on the left guardrail. No changes to care at this time.    LOS: 1 day  No changes in plan since last assessment  Continue to monitor the patient and continue with attempting neuro checks  Subjective: Patient denies any changes since last assessment. Headache is still present but unchanged. Tylenol  is providing mild relief for about an hour before it starts to wear off. She did report a new slightly painful burning sensation in her left leg. She denied pain with movement of the leg, any trauma to the leg. She is unclear as to when the pain started.   Objective: Vital signs in last 24 hours: Temp:  [99.3 F (37.4 C)-100.7 F (38.2 C)] 99.6 F (37.6 C) (12/11 0500) Pulse Rate:  [67-85] 85 (12/11 0700) Resp:  [18-26] 20 (12/11 0700) BP: (91-168)/(46-105) 167/82 (12/11 0700) SpO2:  [87 %-98 %] 97 % (12/11 0700) Arterial Line BP: (130-168)/(71-95) 130/71 (12/10 0900)  Intake/Output from previous day: 12/10 0701 - 12/11 0700 In: 3358.5 [P.O.:1380; I.V.:978.3; IV Piggyback:1000.3] Out: -  Intake/Output this shift: Total I/O In: 75 [I.V.:75] Out: -   PHYSICAL EXAM  HENT:     Head: Normocephalic.     Nose: Nose normal.  Eyes:     Pupils: Pupils  are equal, round, and reactive to light.  Cardiovascular:     Rate and Rhythm: Normal rate.  Pulmonary:     Effort: Pulmonary effort is normal.  Abdominal:     General: Abdomen is flat.  Musculoskeletal:     Cervical back: Normal range of motion.   Neurological:     Mental Status: Patient is alert and oriented.        Cranial Nerves: Cranial nerves 2-12 are intact.     Sensory: Sensation is intact. (Even over painful area on leg)     Motor: Motor function is intact.    Coordination: Coordination is intact.  Skin:     Skin is intact    Lab Results: Recent Labs    10/02/24 0346 10/03/24 0200  WBC 14.7* 13.9*  HGB 11.8* 11.8*  HCT 37.3 37.7  PLT 229 257   BMET Recent Labs    10/02/24 0346 10/02/24 1804 10/03/24 0200  NA 138 142 138  K 4.6  --  3.6  CL 105  --  105  CO2 26  --  27  GLUCOSE 110*  --  128*  BUN 12  --  12  CREATININE 1.05*  --  1.02*  CALCIUM 7.9*  --  7.9*    Studies/Results: VAS US  TRANSCRANIAL DOPPLER Result Date: 10/01/2024  Transcranial Doppler Patient Name:  Ashlee Gordon  Date of Exam:   10/01/2024 Medical Rec #: [id number]      Accession #:    [id number] Date of Birth: [birth year]     Patient Gender: F Patient Age:   34 years Exam Location:  [facility] Procedure:      VAS US  TRANSCRANIAL DOPPLER Referring Phys: FONDA SHARPS --------------------------------------------------------------------------------  Indications: Subarachnoid hemorrhage. 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 S/p balloon assisted coiling.  Performing Technologist: Ricka Sturdivant-Jones RDMS, RVT  Examination Guidelines: A complete evaluation includes B-mode imaging, spectral Doppler, color Doppler, and power Doppler as needed of all accessible portions of each vessel. Bilateral testing is considered an integral part of a complete examination. Limited examinations for reoccurring indications may be performed as noted.  +----------+---------------+----------+-----------+-------+ RIGHT TCD Right VM (cm/s)Depth (cm)PulsatilityComment +----------+---------------+----------+-----------+-------+ MCA             96          1.14      1.15             +----------+---------------+----------+-----------+-------+ ACA            -101         6.40      0.87            +----------+---------------+----------+-----------+-------+ Term ICA        67                    0.86            +----------+---------------+----------+-----------+-------+ PCA P1          22                    0.79            +----------+---------------+----------+-----------+-------+ Opthalmic       21                    1.30            +----------+---------------+----------+-----------+-------+ ICA siphon      26  0.93            +----------+---------------+----------+-----------+-------+ Vertebral       -29                   0.93            +----------+---------------+----------+-----------+-------+ Distal ICA      27                    0.93            +----------+---------------+----------+-----------+-------+  +----------+--------------+----------+-----------+-------+ LEFT TCD  Left VM (cm/s)Depth (cm)PulsatilityComment +----------+--------------+----------+-----------+-------+ MCA            101         4.90      1.04            +----------+--------------+----------+-----------+-------+ ACA            -40                   0.94            +----------+--------------+----------+-----------+-------+ Term ICA        46                   1.20            +----------+--------------+----------+-----------+-------+ PCA P1          38                   1.09            +----------+--------------+----------+-----------+-------+ Opthalmic       26                   1.45            +----------+--------------+----------+-----------+-------+ ICA siphon      46                   0.98            +----------+--------------+----------+-----------+-------+ Vertebral      -35                   0.96            +----------+--------------+----------+-----------+-------+ Distal ICA      34                   1.03             +----------+--------------+----------+-----------+-------+  +------------+-------+-------------+             VM cm/s   Comment    +------------+-------+-------------+ Prox Basilar  -53                +------------+-------+-------------+ Dist Basilar       not insonated +------------+-------+-------------+    Preliminary       Jayson PARAS Ashlee Gordon 10/03/2024, 7:48 AM

## 2024-10-03 NOTE — Plan of Care (Signed)
°  Problem: Health Behavior/Discharge Planning: Goal: Goals will be collaboratively established with patient/family Outcome: Progressing   Problem: Self-Care: Goal: Ability to participate in self-care as condition permits will improve Outcome: Progressing Goal: Verbalization of feelings and concerns over difficulty with self-care will improve Outcome: Progressing Goal: Ability to communicate needs accurately will improve Outcome: Progressing   Problem: Nutrition: Goal: Risk of aspiration will decrease Outcome: Progressing   Problem: Fluid Volume: Goal: Ability to maintain a balanced intake and output will improve Outcome: Progressing

## 2024-10-04 ENCOUNTER — Inpatient Hospital Stay (HOSPITAL_COMMUNITY)

## 2024-10-04 DIAGNOSIS — I609 Nontraumatic subarachnoid hemorrhage, unspecified: Secondary | ICD-10-CM

## 2024-10-04 DIAGNOSIS — I6522 Occlusion and stenosis of left carotid artery: Secondary | ICD-10-CM

## 2024-10-04 LAB — CBC
HCT: 36.2 % (ref 36.0–46.0)
Hemoglobin: 11.5 g/dL — ABNORMAL LOW (ref 12.0–15.0)
MCH: 27.2 pg (ref 26.0–34.0)
MCHC: 31.8 g/dL (ref 30.0–36.0)
MCV: 85.6 fL (ref 80.0–100.0)
Platelets: 263 K/uL (ref 150–400)
RBC: 4.23 MIL/uL (ref 3.87–5.11)
RDW: 14.5 % (ref 11.5–15.5)
WBC: 13.1 K/uL — ABNORMAL HIGH (ref 4.0–10.5)
nRBC: 0 % (ref 0.0–0.2)

## 2024-10-04 LAB — BASIC METABOLIC PANEL WITH GFR
Anion gap: 10 (ref 5–15)
BUN: 10 mg/dL (ref 6–20)
CO2: 25 mmol/L (ref 22–32)
Calcium: 8.1 mg/dL — ABNORMAL LOW (ref 8.9–10.3)
Chloride: 102 mmol/L (ref 98–111)
Creatinine, Ser: 1.06 mg/dL — ABNORMAL HIGH (ref 0.44–1.00)
GFR, Estimated: >60 mL/min (ref >60)
Glucose, Bld: 132 mg/dL — ABNORMAL HIGH (ref 70–99)
Potassium: 3.6 mmol/L (ref 3.5–5.1)
Sodium: 137 mmol/L (ref 135–145)

## 2024-10-04 LAB — SODIUM: Sodium: 138 mmol/L (ref 135–145)

## 2024-10-04 LAB — PHOSPHORUS: Phosphorus: 3.2 mg/dL (ref 2.5–4.6)

## 2024-10-04 LAB — MAGNESIUM: Magnesium: 2.1 mg/dL (ref 1.7–2.4)

## 2024-10-04 MED ORDER — ALPRAZOLAM 0.5 MG PO TABS
0.5000 mg | ORAL_TABLET | Freq: Two times a day (BID) | ORAL | Status: DC | PRN
  Administered 2024-10-04 – 2024-10-05 (×3): 0.5 mg via ORAL
  Filled 2024-10-04 (×4): qty 1

## 2024-10-04 MED ORDER — ACETAMINOPHEN 325 MG PO TABS
650.0000 mg | ORAL_TABLET | ORAL | Status: DC | PRN
  Administered 2024-10-04 – 2024-10-06 (×6): 650 mg via ORAL
  Filled 2024-10-04 (×6): qty 2

## 2024-10-04 MED ORDER — LIDOCAINE 5 % EX PTCH
2.0000 | MEDICATED_PATCH | Freq: Every day | CUTANEOUS | Status: DC
  Administered 2024-10-04 – 2024-10-12 (×8): 2 via TRANSDERMAL
  Filled 2024-10-04 (×9): qty 2

## 2024-10-04 NOTE — Progress Notes (Signed)
 RN notified CCM Dr. Alva for Lidocaine  patch for Pt chronic back and hip pain as well as anxiety management.

## 2024-10-04 NOTE — Plan of Care (Signed)
°  Problem: Education: Goal: Knowledge of disease or condition will improve Outcome: Progressing Goal: Knowledge of secondary prevention will improve (MUST DOCUMENT ALL) Outcome: Progressing Goal: Knowledge of patient specific risk factors will improve (DELETE if not current risk factor) Outcome: Progressing   Problem: Spontaneous Subarachnoid Hemorrhage Tissue Perfusion: Goal: Complications of Spontaneous Subarachnoid Hemorrhage will be minimized Outcome: Progressing   Problem: Self-Care: Goal: Ability to participate in self-care as condition permits will improve Outcome: Progressing Goal: Verbalization of feelings and concerns over difficulty with self-care will improve Outcome: Progressing

## 2024-10-04 NOTE — Progress Notes (Signed)
 NAME:  Ashlee Gordon, MRN:  [id number], DOB:  [birth year], LOS: 7 ADMISSION DATE:  09/27/2024, CONSULTATION DATE:  12/5  REFERRING MD: Pamella CHIEF COMPLAINT:  Severe Headache   History of Present Illness:  Patient is a 53 year old female with significant past medical history of sleep apnea, hypertension, obesity, prediabetes, depression, fibromyalgia, anemia, and osteoarthritis who [facility] ED after a sudden onset of a severe headache approximately around 0 530 on 12/5.  Per report, severe headache occurred while her sister and boyfriend were arguing at the residence.  Patient was immediately transported to CT scan-for stat head showing concerns of a acute subarachnoid hemorrhage, aneurysmal rupture, mild ventriculomegaly, no intraventricular hemorrhage and no midline shift.  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  Neurovascular was consulted and requested patient be transferred to [facility], ED for further neuro eval and and possible IR intervention.  Patient was started on Cleviprex  due to hypertension.  Upon arrival, patient is nauseous and also drowsy but not having no focal neuro deficits. PCCM consulted for admission to 4N ICU after endovascular intervention- aneurysm coiling and possible EVD placement.   Upon ED assessment, patient drowsy, still complaining of headache. Patient able to state name, and oriented to place but not able to state time. Patient has no focal neuro deficits at this time, able to move all extremities but does have generalized weakness. Patient on cleviprex  at this time with ongoing titration.   Pertinent  Medical History   Past Medical History:  Diagnosis Date   Anemia    Back pain    Depression    Fibromyalgia    Hypertension    Infertility, female    Joint pain    Osteoarthritis    Osteoarthritis    Prediabetes    Sleep apnea    SOBOE  (shortness of breath on exertion)    Swelling of both lower extremities    Vitamin D  deficiency      Significant Hospital Events: Including procedures, antibiotic start and stop dates in addition to other pertinent events   12/5 PCCM admit- SAH due to Left ICA aneurysm, plan for coiling by NIR, eval for EVD 12/6 Extubated 12/7 art line x 2 then a 3rd overnight (prior ones malfunctioned) 12/8 significant headache 8/10 scale. Added gabapentin , Fioricet , PRN fentanyl . Also asking to try CPAP while in hospital. New SBP goal of < 200 per Dr. Lester 12/8 CT head with mild improvement in [facility]  Interim History / Subjective:   Complains of worsening headache this morning that she has had for the last few days Sensitive to light, given Fioricet   Participated with OT/PT yesterday Objective    Blood pressure 132/70, pulse 82, temperature 98.8 F (37.1 C), temperature source Oral, resp. rate 20, height 5' 7.99 (1.727 m), weight (!) 205.6 kg, last menstrual period 10/22/2020, SpO2 93%.        Intake/Output Summary (Last 24 hours) at 10/04/2024 1110 Last data filed at 10/04/2024 0700 Gross per 24 hour  Intake 2130 ml  Output 2300 ml  Net -170 ml   Filed Weights   09/27/24 1345 09/28/24 0315  Weight: (!) 200 kg (!) 205.6 kg   Physical Exam: Gen. Pleasant, morbidly obese, in no distress ENT -pupils pinpoint, no pallor Neck: No JVD, no neck stiffness Lungs: no use of accessory muscles, no dullness to percussion, decreased bilateral Cardiovascular: Rhythm regular, heart sounds  normal,  no murmurs or gallops, no peripheral edema Musculoskeletal: No deformities, no cyanosis or clubbing , no tremors Neuro -nonfocal, alert, interactive  Labs show mild hypokalemia, decreasing leukocytosis, stable anemia  Assessment and Plan   Subarachnoid Hemorrhage (H/H 3) due to Left ICA Aneurysm  - s/p balloon assisted coiling 12/5; tortuous irregular walled vessel noted Ongoing headache - 2/2  above Refractory HTN - off Cleviprex  12/10 Sleep Apnea hx - 2/2 obesity.  Not using CPAP Hx Depression, Fibromyalgia on PTA Bupropion . Class 3 obesity Prediabetes - Hgb A1c 6.1. Polyuria -appears improved, sodium drifting down slowly  Continue nimodipine  Vasospasm watch, TCD's per neurosurgery Monitor sodium Nocturnal CPAP Mobilize as much as she will allow Fioricet  as needed for headache, lidocaine  patches for hip and back pain -nonnarcotics should work better for this, her fibromyalgia and morbid obesity clearly complicates her care   Harden Staff MD. [facility]. [facility] Pager : [phone]  If no response to pager , please call [phone] until 7 pm After 7:00 pm call Elink  [phone]     10/04/2024, 11:10 AM

## 2024-10-04 NOTE — Progress Notes (Signed)
 Transcranial Doppler   Date POD PCO2 HCT BP   MCA ACA PCA OPHT SIPH VERT Basilar  12/5 GC         Right  Left   86  74   *  -33   47  *   23  13   19  23    -17  -25   -15        12/6 CK 1        Right  Left   54   55    -27   -37    24   28    14   24     *   21   *    *    *       12/8          Right  Left    67   80    -28   -23    -31   28    19   22     *   *    -38   -41    -41       12/9 rs            Right  Left    96   101    -101   -40    22   38    21   26    26    46    -29   -35    -53       12/12 JH    7        Right  Left    101   92    *   *    *   *    13   24    29    43    *   *    *                 Right  Left                                                               Right  Left                                                       MCA = Middle Cerebral Artery      OPHT = Opthalmic Artery     BASILAR = Basilar Artery   ACA = Anterior Cerebral Artery     SIPH = Carotid Siphon PCA = Posterior Cerebral Artery   VERT = Verterbral Artery                    Normal MCA = 62+\-12 ACA = 50+\-12 PCA = 42+\-23    [place] Ratio R: 3.25  L: 4.00   Results can be found under chart review under CV PROC. 10/04/2024 5:29 PM Annesha Delgreco RVT, RDMS

## 2024-10-04 NOTE — Progress Notes (Signed)
 History  Patient is a 53 year old female with significant past medical history of sleep apnea, hypertension, obesity, prediabetes, depression, fibromyalgia, anemia, and osteoarthritis who [facility] ED after a sudden onset of a severe headache approximately around 0 530 on 12/5. Per report, severe headache occurred while her sister and boyfriend were arguing at the residence. Patient was immediately transported to CT scan-for stat head showing concerns of a acute subarachnoid hemorrhage, aneurysmal rupture, mild ventriculomegaly, no intraventricular hemorrhage and no midline shift. 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  Neurovascular was consulted and requested patient be transferred to [facility], ED for further neuro eval and and possible IR intervention. Patient was started on Cleviprex  due to hypertension. Upon arrival, patient was nauseous and also drowsy but not having no focal neuro deficits. PCCM was consulted for admission to 4N ICU after endovascular intervention- aneurysm coiling.   12/08 CT head revealed mild improvement of SAH primarily within the interhemispheric fissure. No convincing evidence of cerebral infarction.    12/10 @7 :30 Nurse report the patient is highly non-compliant with nursing care and neuro checks. I did not experience this during my assessment; however, this was affirmed by multiple nurses. No change in patient condition or significant event overnight   12/11 at 0750 Once again I received report that the patient was non-compliant with nursing care and neuro checks last night. I did not experience this in my assessment. OT and PT are working with the patient. No significant events reported last night. Report from nurses indicated that the patient has been ordering 3+ plates of food per meal.   12/12: 9188 The patient is now in a bariatric bed and appears  a little more comfortable compared to the other bed. Once again I received report that the patient was non-compliant with nursing care and neuro checks last night. I did not experience this in my assessment. No new events from last night. No changes in patient condition by report or exam.   Assessment/Plan: The patient demonstrates appropriate postoperative recovery with an intact neurological exam and no new deficits. Access site is clean and dry, pain is adequately managed with Tylenol , and there are no signs of infection or complication at this time. The patient does seem to have many pains ranging from back pain to leg pain; however, nothing of neurosurgical significance.   Once again today multiple nurses have expressed difficulty caring for the patient due to her poor receptiveness to care and willingness to participate in care. They report her defecating and urinating willingly in bed and a general uncooperative nature with the nursing staff.   No changes at this time since the last assessment.   Subjective: Patient reports general back and a slight left burning leg pain that is not exacerbated or resolved by anything. She is no longer rubbing against the side rail.   Objective: Vital signs in last 24 hours: Temp:  [98.3 F (36.8 C)-100.7 F (38.2 C)] 98.8 F (37.1 C) (12/12 0758) Pulse Rate:  [68-86] 86 (12/12 0700) Resp:  [14-23] 20 (12/12 0700) BP: (111-165)/(58-103) 159/81 (12/12 0700) SpO2:  [88 %-100 %] 93 % (12/12 0700)  Intake/Output from previous day: 12/11 0701 - 12/12 0700 In: 2720.6 [P.O.:2370; I.V.:350.6] Out: 2625 [Urine:2625] Intake/Output this shift: No intake/output data recorded.  PHYSICAL EXAM  HENT:     Head: Normocephalic.     Nose: Nose normal.  Eyes:  Pupils: Pupils are equal, round, and reactive to light.  Cardiovascular:     Rate and Rhythm: Normal rate.  Pulmonary:     Effort: Pulmonary effort is normal.  Abdominal:     General: Abdomen is  flat.  Musculoskeletal:     Cervical back: Normal range of motion.  Neurological:     Mental Status: Patient is alert and oriented.        Cranial Nerves: Cranial nerves 2-12 are intact.     Sensory: Sensation is intact.     Motor: Motor function is intact.    Coordination: Coordination is intact.  Skin:     Skin is intact     Lab Results: Recent Labs    10/03/24 0200 10/04/24 0422  WBC 13.9* 13.1*  HGB 11.8* 11.5*  HCT 37.7 36.2  PLT 257 263   BMET Recent Labs    10/03/24 0200 10/03/24 1921 10/04/24 0422  NA 138 138 137  K 3.6  --  3.6  CL 105  --  102  CO2 27  --  25  GLUCOSE 128*  --  132*  BUN 12  --  10  CREATININE 1.02*  --  1.06*  CALCIUM 7.9*  --  8.1DEWAINE Jayson PARAS Rosezetta Balderston 10/04/2024, 8:04 AM

## 2024-10-05 ENCOUNTER — Encounter (HOSPITAL_COMMUNITY): Payer: Self-pay | Admitting: Pulmonary Disease

## 2024-10-05 DIAGNOSIS — E871 Hypo-osmolality and hyponatremia: Secondary | ICD-10-CM

## 2024-10-05 LAB — BASIC METABOLIC PANEL WITH GFR
Anion gap: 7 (ref 5–15)
BUN: 7 mg/dL (ref 6–20)
CO2: 26 mmol/L (ref 22–32)
Calcium: 8.3 mg/dL — ABNORMAL LOW (ref 8.9–10.3)
Chloride: 103 mmol/L (ref 98–111)
Creatinine, Ser: 0.97 mg/dL (ref 0.44–1.00)
GFR, Estimated: >60 mL/min (ref >60)
Glucose, Bld: 117 mg/dL — ABNORMAL HIGH (ref 70–99)
Potassium: 3.6 mmol/L (ref 3.5–5.1)
Sodium: 136 mmol/L (ref 135–145)

## 2024-10-05 LAB — CBC
HCT: 36.6 % (ref 36.0–46.0)
Hemoglobin: 11.7 g/dL — ABNORMAL LOW (ref 12.0–15.0)
MCH: 27.6 pg (ref 26.0–34.0)
MCHC: 32 g/dL (ref 30.0–36.0)
MCV: 86.3 fL (ref 80.0–100.0)
Platelets: 251 K/uL (ref 150–400)
RBC: 4.24 MIL/uL (ref 3.87–5.11)
RDW: 14.5 % (ref 11.5–15.5)
WBC: 12.4 K/uL — ABNORMAL HIGH (ref 4.0–10.5)
nRBC: 0 % (ref 0.0–0.2)

## 2024-10-05 LAB — SODIUM: Sodium: 140 mmol/L (ref 135–145)

## 2024-10-05 MED ORDER — SODIUM CHLORIDE 1 G PO TABS
1.0000 g | ORAL_TABLET | Freq: Two times a day (BID) | ORAL | Status: AC
  Administered 2024-10-05 – 2024-10-06 (×2): 1 g via ORAL
  Filled 2024-10-05 (×2): qty 1

## 2024-10-05 MED ORDER — ALPRAZOLAM 0.25 MG PO TABS
0.5000 mg | ORAL_TABLET | Freq: Four times a day (QID) | ORAL | Status: DC | PRN
  Administered 2024-10-05 – 2024-10-10 (×10): 0.5 mg via ORAL
  Filled 2024-10-05 (×5): qty 1
  Filled 2024-10-05 (×3): qty 2
  Filled 2024-10-05 (×2): qty 1

## 2024-10-05 MED ORDER — DICLOFENAC SODIUM 1 % EX GEL
2.0000 g | Freq: Four times a day (QID) | CUTANEOUS | Status: DC | PRN
  Administered 2024-10-06 – 2024-10-11 (×5): 2 g via TOPICAL
  Filled 2024-10-05 (×3): qty 100

## 2024-10-05 MED ORDER — DICLOFENAC SODIUM 1 % EX GEL
2.0000 g | Freq: Four times a day (QID) | CUTANEOUS | Status: DC
  Filled 2024-10-05: qty 100

## 2024-10-05 NOTE — Plan of Care (Signed)
 Problem: Education: Goal: Knowledge of disease or condition will improve Outcome: Progressing Goal: Knowledge of secondary prevention will improve (MUST DOCUMENT ALL) Outcome: Progressing Goal: Knowledge of patient specific risk factors will improve (DELETE if not current risk factor) Outcome: Progressing   Problem: Spontaneous Subarachnoid Hemorrhage Tissue Perfusion: Goal: Complications of Spontaneous Subarachnoid Hemorrhage will be minimized Outcome: Progressing   Problem: Coping: Goal: Will verbalize positive feelings about self Outcome: Progressing Goal: Will identify appropriate support needs Outcome: Progressing   Problem: Health Behavior/Discharge Planning: Goal: Ability to manage health-related needs will improve Outcome: Progressing Goal: Goals will be collaboratively established with patient/family Outcome: Progressing   Problem: Self-Care: Goal: Ability to participate in self-care as condition permits will improve Outcome: Progressing Goal: Verbalization of feelings and concerns over difficulty with self-care will improve Outcome: Progressing Goal: Ability to communicate needs accurately will improve Outcome: Progressing   Problem: Nutrition: Goal: Risk of aspiration will decrease Outcome: Progressing Goal: Dietary intake will improve Outcome: Progressing   Problem: Education: Goal: Ability to describe self-care measures that may prevent or decrease complications (Diabetes Survival Skills Education) will improve Outcome: Progressing Goal: Individualized Educational Video(s) Outcome: Progressing   Problem: Coping: Goal: Ability to adjust to condition or change in health will improve Outcome: Progressing   Problem: Fluid Volume: Goal: Ability to maintain a balanced intake and output will improve Outcome: Progressing   Problem: Health Behavior/Discharge Planning: Goal: Ability to identify and utilize available resources and services will  improve Outcome: Progressing Goal: Ability to manage health-related needs will improve Outcome: Progressing   Problem: Metabolic: Goal: Ability to maintain appropriate glucose levels will improve Outcome: Progressing   Problem: Nutritional: Goal: Maintenance of adequate nutrition will improve Outcome: Progressing Goal: Progress toward achieving an optimal weight will improve Outcome: Progressing   Problem: Skin Integrity: Goal: Risk for impaired skin integrity will decrease Outcome: Progressing   Problem: Tissue Perfusion: Goal: Adequacy of tissue perfusion will improve Outcome: Progressing   Problem: Education: Goal: Knowledge of General Education information will improve Description: Including pain rating scale, medication(s)/side effects and non-pharmacologic comfort measures Outcome: Progressing   Problem: Health Behavior/Discharge Planning: Goal: Ability to manage health-related needs will improve Outcome: Progressing   Problem: Clinical Measurements: Goal: Ability to maintain clinical measurements within normal limits will improve Outcome: Progressing Goal: Will remain free from infection Outcome: Progressing Goal: Diagnostic test results will improve Outcome: Progressing Goal: Respiratory complications will improve Outcome: Progressing Goal: Cardiovascular complication will be avoided Outcome: Progressing   Problem: Activity: Goal: Risk for activity intolerance will decrease Outcome: Progressing   Problem: Nutrition: Goal: Adequate nutrition will be maintained Outcome: Progressing   Problem: Coping: Goal: Level of anxiety will decrease Outcome: Progressing   Problem: Elimination: Goal: Will not experience complications related to bowel motility Outcome: Progressing Goal: Will not experience complications related to urinary retention Outcome: Progressing   Problem: Pain Managment: Goal: General experience of comfort will improve and/or be  controlled Outcome: Progressing   Problem: Safety: Goal: Ability to remain free from injury will improve Outcome: Progressing   Problem: Skin Integrity: Goal: Risk for impaired skin integrity will decrease Outcome: Progressing   Problem: Education: Goal: Understanding of CV disease, CV risk reduction, and recovery process will improve Outcome: Progressing Goal: Individualized Educational Video(s) Outcome: Progressing   Problem: Activity: Goal: Ability to return to baseline activity level will improve Outcome: Progressing   Problem: Cardiovascular: Goal: Ability to achieve and maintain adequate cardiovascular perfusion will improve Outcome: Progressing Goal: Vascular access site(s) Level 0-1 will be maintained  Outcome: Progressing   Problem: Health Behavior/Discharge Planning: Goal: Ability to safely manage health-related needs after discharge will improve Outcome: Progressing

## 2024-10-05 NOTE — Plan of Care (Signed)
°  Problem: Education: Goal: Knowledge of disease or condition will improve Outcome: Progressing Goal: Knowledge of secondary prevention will improve (MUST DOCUMENT ALL) Outcome: Progressing Goal: Knowledge of patient specific risk factors will improve (DELETE if not current risk factor) Outcome: Progressing   Problem: Spontaneous Subarachnoid Hemorrhage Tissue Perfusion: Goal: Complications of Spontaneous Subarachnoid Hemorrhage will be minimized Outcome: Progressing   Problem: Health Behavior/Discharge Planning: Goal: Goals will be collaboratively established with patient/family Outcome: Progressing   Problem: Self-Care: Goal: Ability to participate in self-care as condition permits will improve Outcome: Progressing

## 2024-10-05 NOTE — Progress Notes (Signed)
 NAME:  Annessa Slyter, MRN:  [id number], DOB:  09/11/1971, LOS: 8 ADMISSION DATE:  09/27/2024, CONSULTATION DATE:  12/5  REFERRING MD: Pamella CHIEF COMPLAINT:  Severe Headache   History of Present Illness:  Patient is a 53 year old female with significant past medical history of  who [facility] ED after a sudden onset of a severe headache approximately around 0 530 on 12/5 due to [facility]. 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, 8-9 mm, paraphalamic segment was called by neurovascular 12/5   Pertinent  Medical History     sleep apnea, hypertension, obesity, prediabetes, depression, fibromyalgia, anemia, and osteoarthritis      Past Medical History:  Diagnosis Date   Anemia    Back pain    Depression    Fibromyalgia    Hypertension    Infertility, female    Joint pain    Osteoarthritis    Osteoarthritis    Prediabetes    Sleep apnea    SOBOE (shortness of breath on exertion)    Swelling of both lower extremities    Vitamin D  deficiency      Significant Hospital Events: Including procedures, antibiotic start and stop dates in addition to other pertinent events   12/5 PCCM admit- SAH due to Left ICA aneurysm, plan for coiling by NIR, eval for EVD 12/6 Extubated 12/7 art line x 2 then a 3rd overnight (prior ones malfunctioned) 12/8 significant headache 8/10 scale. Added gabapentin , Fioricet , PRN fentanyl . Also asking to try CPAP while in hospital. New SBP goal of < 200 per Dr. Lester 12/8 CT head with mild improvement in [facility]  Interim History / Subjective:  Persistent headache Complains of hip and back pain -Lidoderm  patches and Xanax  given yesterday provided mild relief   Objective    Blood pressure 112/87, pulse 81, temperature 98.9 F (37.2 C), temperature source Axillary, resp. rate 16, height 5' 7.99 (1.727 m), weight (!) 205.6 kg, last menstrual period 10/22/2020, SpO2 93%.        Intake/Output Summary (Last 24 hours) at  10/05/2024 1010 Last data filed at 10/05/2024 0500 Gross per 24 hour  Intake 500 ml  Output 850 ml  Net -350 ml   Filed Weights   09/27/24 1345 09/28/24 0315  Weight: (!) 200 kg (!) 205.6 kg   Physical Exam: Gen. morbidly obese, in some distress due to pain ENT -pupils 3 mm reactive, no pallor Neck: No JVD, no neck stiffness Lungs: decreased breath sounds bilateral, no accessory muscle use Cardiovascular: S1-S2 regular no peripheral edema Musculoskeletal: No deformities, no cyanosis or clubbing , no tremors Neuro -nonfocal, alert, interactive  Labs show sodium drifting down, decreased leukocytosis  Assessment and Plan   Subarachnoid Hemorrhage (H/H 3) due to Left ICA Aneurysm  - s/p balloon assisted coiling 12/5; tortuous irregular walled vessel noted Ongoing headache - 2/2 above HTN - off Cleviprex  12/10 OSA - 2/2 obesity.  Not using CPAP Hx Depression, Fibromyalgia on PTA Bupropion . Class 3 obesity Prediabetes - Hgb A1c 6.1.   Continue nimodipine  Vasospasm watch, TCD's per neurosurgery -MCA slight high but stable Reinforce nocturnal CPAP Mobilize as much as she will allow Fioricet  as needed for headache, Lidoderm  patches for hip and back pain -nonnarcotics should work better for this, her fibromyalgia makes it difficult to differentiate whether her headache is actually related to [facility] or part of her generalized complaints  Polyuria -improved, mild hyponatremia Monitor sodium , obtain urine sodium and osmolality, add salt tabs  Carmon Brigandi  MD. FCCP. [place] [facility] Pager : [phone]  If no response to pager , please call [phone] until 7 pm After 7:00 pm call Elink  [phone]     10/05/2024, 10:10 AM

## 2024-10-05 NOTE — Progress Notes (Signed)
 This RN assessed Pt and notice Pt had urinated on herself. Pt had been declining baths all day but this RN stated Pt needed bath and pericare to prevent skin breakdown and re-educated on the importance of using the CB as well as getting out of bed to use the bedside commode when she needs to urinate or have a BM.  This RN and NT cleaned Pt up and got Pt repositioned in bed, as this RN was walking out, Pt asked for a bedpan and purewick. This RN re-educated on the need to get out of bed and use bedside commode, Pt stated she will not get out of bed to use the bedside commode and urinated all over herself. Re-educated on importance of using bedside commode to prevent skin breakdown. Pt stated she wanted to speak to the Director of Nursing. RN told Pt she will pass it on but she will be back on Monday, I would let the charge RN know if she wanted to voice her concerns, Pt declined charge RN. [employer] notified.

## 2024-10-05 NOTE — Plan of Care (Signed)
 This shift pt expressing continuously her discomfort from previous chronic back and hip pain issues along with nearly constant headache. Pt has spoken with RN and MD about same, all attempts and medications are being given with no relief per the pt. Pt stating that the oxycodone  does not help, it only makes her sleepy then she wakes with the same excruciating pain as before. RN encouraged pt to sit in the chair, which she did for documented timeframe and after much education and convincing that increased movement will help with pain, pt did  ambulate short distance in the room then back to the bed. Pt moves/turns in the bed well on her own. Pt stated that at home she uses a heating pad to which she was given permission to use a home pad. Pt continuously moaning and requesting very frequent repositioning, requiring nursing to be in the room for extended periods of time.

## 2024-10-05 NOTE — Progress Notes (Addendum)
 While sitting up in the chair this AM, pt sister arrived with some rubbing alcohol to which pt had requested so it could be place on her aching areas. While in the room to give medications it was stated by the pt she had been asking to speak to the DON. RN asked who that was as not being familiar with that acronym. Pt sister responded with she doesn't know she's not from here, pt responded with don't you all have a DON?. RN while trying to think of word clarification of who/what DON is, deep breathing and eyes closed to clear mind easier to which pt sister immediately asked do you not want to be here?. RN tried to explain clarification was trying to be made. Also that RN and pt have had a good rapport with joking back and forth to which pt told her sister we do joke, but I did also tell her (sister) that you BANKER) abandoned me. RN tried to explain this is not a 1:1 care ratio, and that when stepping out there is communication that RN or someone will return along with that pt does have her call light with in reach if she needed something before a staff member were to return.  When transferring pt from the chair to the bed, pt walked to the main sink in the room then laid in the bed. Pt capable of walking on her own, but holding onto things stating I feel more comfortable holding something, offered a walker to the pt of which she only used to assist her in standing. When getting in the bed, pt initially sat on the edge well near the head of the bed. Multiple times RN asked pt if she was fully on the bed as it appeared she was not but pt assured RN she was on the bed and was not going to slide or fall off. At that moment in time pt niece walked in asking what is going on here, then proceeded to place herself infront of pt where RN was standing stating I'll do it myself (indicating getting pt legs in the bed). Previously pt had not needed assistance nor asked for assistance with getting legs in the bed.  While adjusting pt, her niece placed a pillow between pt legs to which pt stating having some relief and niece stated out loud I'm not even a nurse and I knew to do that. RN stayed in room to ensure pt safety but allowed pt niece to continue assisting pt since she indicated she would be doing it herself.  Later, Pt niece at the bedside stating that when she re-arrived, apparently pt had urinated on herself. Pt had not used call light, as she has used appropriately in the past and was within reach to notify staff she needed to use the bedside commode nor that she had soiled herself with urine and needed cleaned. Pt niece verbalizing her frustrations, stating she feels pt has been neglected. [facility] house supervisor did visit the pt in the room this afternoon to address any concerns at which time she denied any concerns stating she and I we get along, we're good (indicating the RN assigned to her today who was present in the room). Not long after pt used call light to then notify RN that where's that director, I'm ready to talk to him now.  Pt niece asking : Who can I complain to because she has been neglected all week. I know many nurses are just here for the  money but this is not patient care.

## 2024-10-06 LAB — BASIC METABOLIC PANEL WITH GFR
Anion gap: 13 (ref 5–15)
BUN: 8 mg/dL (ref 6–20)
CO2: 25 mmol/L (ref 22–32)
Calcium: 8.5 mg/dL — ABNORMAL LOW (ref 8.9–10.3)
Chloride: 100 mmol/L (ref 98–111)
Creatinine, Ser: 1.15 mg/dL — ABNORMAL HIGH (ref 0.44–1.00)
GFR, Estimated: 57 mL/min — ABNORMAL LOW (ref >60)
Glucose, Bld: 116 mg/dL — ABNORMAL HIGH (ref 70–99)
Potassium: 3.4 mmol/L — ABNORMAL LOW (ref 3.5–5.1)
Sodium: 138 mmol/L (ref 135–145)

## 2024-10-06 LAB — CBC
HCT: 37.2 % (ref 36.0–46.0)
Hemoglobin: 12 g/dL (ref 12.0–15.0)
MCH: 27.4 pg (ref 26.0–34.0)
MCHC: 32.3 g/dL (ref 30.0–36.0)
MCV: 84.9 fL (ref 80.0–100.0)
Platelets: 299 K/uL (ref 150–400)
RBC: 4.38 MIL/uL (ref 3.87–5.11)
RDW: 14.5 % (ref 11.5–15.5)
WBC: 12.9 K/uL — ABNORMAL HIGH (ref 4.0–10.5)
nRBC: 0 % (ref 0.0–0.2)

## 2024-10-06 LAB — SODIUM: Sodium: 143 mmol/L (ref 135–145)

## 2024-10-06 MED ORDER — OXYCODONE HCL 5 MG PO TABS: 5.0000 mg | ORAL_TABLET | ORAL | Status: DC | PRN

## 2024-10-06 MED ORDER — OXYCODONE HCL 5 MG PO TABS
10.0000 mg | ORAL_TABLET | ORAL | Status: DC | PRN
  Administered 2024-10-07 – 2024-10-12 (×12): 10 mg via ORAL
  Filled 2024-10-06 (×12): qty 2

## 2024-10-06 MED ORDER — POTASSIUM CHLORIDE CRYS ER 20 MEQ PO TBCR
40.0000 meq | EXTENDED_RELEASE_TABLET | Freq: Once | ORAL | Status: AC
  Administered 2024-10-06: 40 meq via ORAL
  Filled 2024-10-06: qty 2

## 2024-10-06 NOTE — Progress Notes (Signed)
°   10/06/24 0051  BiPAP/CPAP/SIPAP  BiPAP/CPAP/SIPAP Pt Type Adult  BiPAP/CPAP/SIPAP Resmed  Mask Type Full face mask  Mask Size Medium  Respiratory Rate 10 breaths/min  Patient Home Machine No  Patient Home Mask No  Patient Home Tubing No  Auto Titrate Yes  Minimum cmH2O 5 cmH2O  Maximum cmH2O 15 cmH2O  BiPAP/CPAP /SiPAP Vitals  Pulse Rate 86  Resp (!) 24  SpO2 94 %  Bilateral Breath Sounds Clear;Diminished  MEWS Score/Color  MEWS Score 1  MEWS Score Color Green

## 2024-10-06 NOTE — Progress Notes (Signed)
 NAME:  Ashlee Gordon, MRN:  [id number], DOB:  [birth year], LOS: 9 ADMISSION DATE:  09/27/2024, CONSULTATION DATE:  12/5  REFERRING MD: Pamella CHIEF COMPLAINT:  Severe Headache   History of Present Illness:  Patient is a 53 year old female with significant past medical history of  who [facility] after a sudden onset of a severe headache approximately around 0 530 on 12/5 due to [facility]. 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, 8-9 mm, paraphalamic segment was called by neurovascular 12/5   Pertinent  Medical History     sleep apnea, hypertension, obesity, prediabetes, depression, fibromyalgia, anemia, and osteoarthritis      Past Medical History:  Diagnosis Date   Anemia    Depression    Diabetes mellitus without complication (HCC)    Fibromyalgia    Hypertension    Infertility, female    Morbid obesity (HCC)    OSA (obstructive sleep apnea)    Osteoarthritis    SAH (subarachnoid hemorrhage) (HCC) 09/27/2024   SOBOE (shortness of breath on exertion)    Swelling of both lower extremities    Vitamin D  deficiency      Significant Hospital Events: Including procedures, antibiotic start and stop dates in addition to other pertinent events   12/5 PCCM admit- SAH due to Left ICA aneurysm, plan for coiling by NIR, eval for EVD 12/6 Extubated 12/7 art line x 2 then a 3rd overnight (prior ones malfunctioned) 12/8 significant headache 8/10 scale. Added gabapentin , Fioricet , PRN fentanyl . Also asking to try CPAP while in hospital. New SBP goal of < 200 per Dr. Lester 12/8 CT head with mild improvement in [facility] 12/12 persistent headache, hip and back pain -Lidoderm  patches, Xanax  added  Interim History / Subjective:   Complains of headache, slightly improved Used BiPAP for a little bit overnight  Objective    Blood pressure (!) 145/76, pulse 81, temperature 98.9 F (37.2 C), temperature source Axillary, resp. rate 18, height 5' 7.99 (1.727 m),  weight (!) 205.6 kg, last menstrual period 10/22/2020, SpO2 96%.        Intake/Output Summary (Last 24 hours) at 10/06/2024 1251 Last data filed at 10/06/2024 0600 Gross per 24 hour  Intake 960 ml  Output --  Net 960 ml   Filed Weights   09/27/24 1345 09/27/24 1630 09/28/24 0315  Weight: (!) 200 kg (!) 205.6 kg (!) 205.6 kg   Physical Exam: Morbidly obese all covered up in bed Somnolent but wakes up easily, follows command, good strength all 4 extremities S1-S2 distant  Labs show sodium improved to 138, mild hypokalemia  Assessment and Plan   Subarachnoid Hemorrhage (H/H 3) due to Left ICA Aneurysm  - s/p balloon assisted coiling 12/5; tortuous irregular walled vessel noted Ongoing headache - 2/2 above HTN - off Cleviprex  12/10 Hx Depression, Fibromyalgia on PTA Bupropion . Class 3 obesity Prediabetes - Hgb A1c 6.1.   Continue nimodipine  Vasospasm watch, TCD's per neurosurgery -MCA slight high but stable Mobilize as much as she will allow Pain appears better controlled with Fioricet , Lidoderm  patches, gabapentin  and fentanyl  for breakthrough.  Fibromyalgia context makes this difficult to interpret  Polyuria -improved, hyponatremia improved Monitor sodium , continue salt tabs  OSA - continue nocturnal CPAP  Harden Staff MD. FCCP. [place] [facility] Pager : [phone]  If no response to pager , please call [phone] until 7 pm After 7:00 pm call Elink  [phone]     10/06/2024, 12:51 PM

## 2024-10-06 NOTE — Progress Notes (Signed)
 eLink Physician-Brief Progress Note Patient Name: Ashlee Gordon DOB: 1971/09/03 MRN: [id number]   Date of Service  10/06/2024  HPI/Events of Note  Complaining of pain all over, wants more pain medicine with increased frequency.  Already on oxycodone /fentanyl  as needed.  Received Xanax , scheduled Tylenol , and Fioricet .  Attempted to talk to the patient but she continues to groan without communicating directly  eICU Interventions  Increase sliding scale oxycodone .  Nonpharmacological interventions favored-increased ambulation, change in position, stretching at bedside.   0130 - Add nystating powder for skin breakdown  Intervention Category Intermediate Interventions: Pain - evaluation and management  Bernhardt Riemenschneider 10/06/2024, 9:14 PM

## 2024-10-07 ENCOUNTER — Inpatient Hospital Stay (HOSPITAL_COMMUNITY)

## 2024-10-07 DIAGNOSIS — E66813 Obesity, class 3: Secondary | ICD-10-CM

## 2024-10-07 DIAGNOSIS — I10 Essential (primary) hypertension: Secondary | ICD-10-CM

## 2024-10-07 DIAGNOSIS — I671 Cerebral aneurysm, nonruptured: Secondary | ICD-10-CM

## 2024-10-07 LAB — BASIC METABOLIC PANEL WITH GFR
Anion gap: 9 (ref 5–15)
BUN: 10 mg/dL (ref 6–20)
CO2: 26 mmol/L (ref 22–32)
Calcium: 8.5 mg/dL — ABNORMAL LOW (ref 8.9–10.3)
Chloride: 104 mmol/L (ref 98–111)
Creatinine, Ser: 1.09 mg/dL — ABNORMAL HIGH (ref 0.44–1.00)
GFR, Estimated: >60 mL/min (ref >60)
Glucose, Bld: 103 mg/dL — ABNORMAL HIGH (ref 70–99)
Potassium: 3.7 mmol/L (ref 3.5–5.1)
Sodium: 139 mmol/L (ref 135–145)

## 2024-10-07 LAB — CBC
HCT: 37.8 % (ref 36.0–46.0)
Hemoglobin: 12 g/dL (ref 12.0–15.0)
MCH: 27 pg (ref 26.0–34.0)
MCHC: 31.7 g/dL (ref 30.0–36.0)
MCV: 84.9 fL (ref 80.0–100.0)
Platelets: 323 K/uL (ref 150–400)
RBC: 4.45 MIL/uL (ref 3.87–5.11)
RDW: 14.5 % (ref 11.5–15.5)
WBC: 10.7 K/uL — ABNORMAL HIGH (ref 4.0–10.5)
nRBC: 0 % (ref 0.0–0.2)

## 2024-10-07 LAB — PHOSPHORUS: Phosphorus: 3.6 mg/dL (ref 2.5–4.6)

## 2024-10-07 LAB — SODIUM: Sodium: 137 mmol/L (ref 135–145)

## 2024-10-07 LAB — MAGNESIUM: Magnesium: 2.1 mg/dL (ref 1.7–2.4)

## 2024-10-07 MED ORDER — POTASSIUM CHLORIDE CRYS ER 20 MEQ PO TBCR
40.0000 meq | EXTENDED_RELEASE_TABLET | Freq: Once | ORAL | Status: AC
  Administered 2024-10-07: 08:00:00 40 meq via ORAL
  Filled 2024-10-07: qty 2

## 2024-10-07 MED ORDER — NYSTATIN 100000 UNIT/GM EX POWD
Freq: Two times a day (BID) | CUTANEOUS | Status: DC
  Filled 2024-10-07 (×2): qty 15

## 2024-10-07 NOTE — Progress Notes (Signed)
 Assisted pt from bed to bedside commode and back to bed. It took me, primary nurse, and nurse tech 1hr and 45 mins to complete care. Pt received bath, refuses to wear gown because it is not big enough and we were not able to find another one. I called other Icu units and did not have big gown available. Pt refuses assistance for the most part. She is argumentative and hit me on the face with towel. She also throw a small towel towards my feet and said that we were not doing what she wanted. She was at the edge of the bed and almost fell. She disregards education and safety. Charge nurse aware.

## 2024-10-07 NOTE — Progress Notes (Signed)
 History  Patient is a 53 year old female with significant past medical history of sleep apnea, hypertension, obesity, prediabetes, depression, fibromyalgia, anemia, and osteoarthritis who [facility] ED after a sudden onset of a severe headache approximately around 0 530 on 12/5. Per report, severe headache occurred while her sister and boyfriend were arguing at the residence. Patient was immediately transported to CT scan-for stat head showing concerns of a acute subarachnoid hemorrhage, aneurysmal rupture, mild ventriculomegaly, no intraventricular hemorrhage and no midline shift. 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  Neurovascular was consulted and requested patient be transferred to [facility], ED for further neuro eval and and possible IR intervention. Patient was started on Cleviprex  due to hypertension. Upon arrival, patient was nauseous and also drowsy but not having no focal neuro deficits. PCCM was consulted for admission to 4N ICU after endovascular intervention- aneurysm coiling.   12/08 CT head revealed mild improvement of SAH primarily within the interhemispheric fissure. No convincing evidence of cerebral infarction.    12/15 No significant events. The patient has been neurologically recovering appropriately. She continues to have no deficits on exam. Nurses continue to express that she resists care.   Assessment/Plan: The patient demonstrates appropriate postoperative recovery with an intact neurological exam and no new deficits. Site is clean and dry, pain is managed with Tylenol , Fentanyl , and Oxy. There are no signs of infection or complication at this time. Because of the patients current intact exam, Dr.Heck agreeable to transfer this patient to a stepdown unit as long as she remains in the hospital for continued monitoring.    Continue neuro monitoring Step down  transfer.  Encourage mobility to help improve spasm and back pain.  Subjective: Patient reports back spasms and pain. She reports that this is her baseline and she has been receiving injections in her back through orthopedics. She reports that her head feels much better with only slight ache behind her eyes remaining.   Objective: Vital signs in last 24 hours: Temp:  [98.7 F (37.1 C)-99.4 F (37.4 C)] 98.8 F (37.1 C) (12/15 0755) Pulse Rate:  [75-101] 89 (12/15 0755) Resp:  [16-25] 23 (12/15 0755) BP: (87-168)/(54-115) 168/95 (12/15 0755) SpO2:  [92 %-100 %] 98 % (12/15 0755)  Intake/Output from previous day: 12/14 0701 - 12/15 0700 In: -  Out: 300 [Urine:300] Intake/Output this shift: No intake/output data recorded.  PHYSICAL EXAM  HENT:     Head: Normocephalic.     Nose: Nose normal.  Eyes:     Pupils: Pupils are equal, round, and reactive to light.  Cardiovascular:     Rate and Rhythm: Normal rate.  Pulmonary:     Effort: Pulmonary effort is normal.  Abdominal:     General: Abdomen is flat.  Musculoskeletal:     Cervical back: Normal range of motion.  Neurological:     Mental Status: Patient is alert and oriented.        Cranial Nerves: Cranial nerves 2-12 are intact.     Sensory: Sensation is intact.     Motor: Motor function is intact.    Coordination: Coordination is intact.  Skin:     Skin is intact   Lab Results: Recent Labs    10/06/24 0420 10/07/24 0529  WBC 12.9* 10.7*  HGB 12.0 12.0  HCT 37.2 37.8  PLT 299 323   BMET Recent Labs    10/06/24 0420 10/06/24  1805 10/07/24 0529  NA 138 143 139  K 3.4*  --  3.7  CL 100  --  104  CO2 25  --  26  GLUCOSE 116*  --  103*  BUN 8  --  10  CREATININE 1.15*  --  1.09*  CALCIUM 8.5*  --  8.5*    Studies/Results: No results found.     Jayson PARAS Teighan Aubert 10/07/2024, 8:25 AM

## 2024-10-07 NOTE — Progress Notes (Signed)
 Transcranial Doppler   Date POD PCO2 HCT BP   MCA ACA PCA OPHT SIPH VERT Basilar  12/5 GC         Right  Left   86  74   *  -33   47  *   23  13   19  23    -17  -25   -15        12/6 CK 1        Right  Left   54   55    -27   -37    24   28    14   24     *   21   *    *    *       12/8          Right  Left    67   80    -28   -23    -31   28    19   22     *   *    -38   -41    -41       12/9 rs            Right  Left    96   101    -101   -40    22   38    21   26    26    46    -29   -35    -53       12/12 JH    7        Right  Left    101   92    *   *    *   *    13   24    29    43    *   *    *        12/15   JH  10       Right  Left    101   93    -25   -16    *   29    16   15     34   30    -30   -43    *                 Right  Left                                                       MCA = Middle Cerebral Artery      OPHT = Opthalmic Artery     BASILAR = Basilar Artery   ACA = Anterior Cerebral Artery     SIPH = Carotid Siphon PCA = Posterior Cerebral Artery   VERT = Verterbral Artery                    Normal MCA = 62+\-12 ACA = 50+\-12 PCA = 42+\-23    [place] Ratio R: 2.97  L: 3.58    Results can be found under chart review under CV PROC. 10/07/2024 2:11 PM Petrina Melby RVT, RDMS

## 2024-10-07 NOTE — Progress Notes (Addendum)
 Occupational Therapy Treatment Patient Details Name: Dexter Sauser MRN: [id number] DOB: 18-Apr-1971 Today's Date: 10/07/2024   History of present illness [name] adm 09/27/24 with sharp HA and emesis at [facility], CT (+) SAH, transfer to [facility] . CT angiogram Large aneurysm; 12/5 intubated for coiling embolization; extubated 12/6. PMHx: HTN, morbid obesity, prediabetes, depression, fibromyalgia, OA   OT comments  Pt supine in bed and agreeable to OT/PT session with maximal encouragement.  She requires significantly increased time for task initiation.  Today reports back and L abdominal pain, but no headache.  She demonstrates poor safety with transfers and decreased problem solving but not formally assessed.  Pt needing assist for LB ADLs this date.  Pt ultimately not concerned about ADLs as she reports her family can assist at home. Encouraged continued OOB multiple times a day, pt with poor activity tolerance. Added HHOT services to dc plan to optimize independence, safety and return to PLOF.  Will follow.       If plan is discharge home, recommend the following:  Help with stairs or ramp for entrance;Assist for transportation;Assistance with cooking/housework;A lot of help with bathing/dressing/bathroom;Direct supervision/assist for financial management;Direct supervision/assist for medications management;A little help with walking and/or transfers   Equipment Recommendations  Other (comment);BSC/3in1 (bari BSC, RW)    Recommendations for Other Services      Precautions / Restrictions Precautions Precautions: Fall Recall of Precautions/Restrictions: Impaired Precaution/Restrictions Comments: SBP <170, watch SpO2 Restrictions Weight Bearing Restrictions Per Provider Order: No       Mobility Bed Mobility Overal bed mobility: Needs Assistance Bed Mobility: Supine to Sit     Supine to sit: Min assist     General bed mobility comments: cues for safety, HOB 20 degrees, use of rail  and HHA to elevate trunk to sitting, significantly increased time to initiate rise to sitting grossly 5 min    Transfers Overall transfer level: Needs assistance   Transfers: Sit to/from Stand Sit to Stand: Contact guard assist, +2 safety/equipment           General transfer comment: significantly increased time to initiate, poor safety and using RW under R hand sideways to power up     Balance Overall balance assessment: Needs assistance Sitting-balance support: No upper extremity supported, Feet supported Sitting balance-Leahy Scale: Fair     Standing balance support: Bilateral upper extremity supported, During functional activity, Single extremity supported Standing balance-Leahy Scale: Poor Standing balance comment: UE support during mobility, static standing leaning against surfaces                           ADL either performed or assessed with clinical judgement   ADL Overall ADL's : Needs assistance/impaired                     Lower Body Dressing: Moderate assistance;Sit to/from stand Lower Body Dressing Details (indicate cue type and reason): would need assist with socks today Toilet Transfer: Contact guard assist;+2 for safety/equipment Toilet Transfer Details (indicate cue type and reason): using RW, needing cueing for safety and RW mgmt         Functional mobility during ADLs: Contact guard assist;+2 for safety/equipment      Extremity/Trunk Assessment Upper Extremity Assessment Upper Extremity Assessment: Overall WFL for tasks assessed   Lower Extremity Assessment Lower Extremity Assessment: Defer to PT evaluation        Vision   Vision Assessment?: No apparent visual deficits;Wears glasses  for reading   Perception     Praxis     Communication Communication Communication: No apparent difficulties   Cognition Arousal: Alert Behavior During Therapy: Flat affect Cognition: Cognition impaired             OT -  Cognition Comments: not formally assesed today, pt continues to be self limiting and requires cueing for safety.  likley some baseline personality.                 Following commands: Impaired Following commands impaired: Follows one step commands with increased time, Follows one step commands inconsistently      Cueing   Cueing Techniques: Verbal cues, Tactile cues, Visual cues  Exercises      Shoulder Instructions       General Comments VSS on RA    Pertinent Vitals/ Pain       Pain Assessment Pain Assessment: 0-10 Pain Score: 6  Pain Location: left abdomen, back Pain Descriptors / Indicators: Aching, Guarding, Discomfort Pain Intervention(s): Limited activity within patient's tolerance, Monitored during session, Repositioned, Premedicated before session  Home Living                                          Prior Functioning/Environment              Frequency  Min 2X/week        Progress Toward Goals  OT Goals(current goals can now be found in the care plan section)  Progress towards OT goals: Progressing toward goals     Plan      Co-evaluation    PT/OT/SLP Co-Evaluation/Treatment: Yes Reason for Co-Treatment: For patient/therapist safety;To address functional/ADL transfers;Necessary to address cognition/behavior during functional activity PT goals addressed during session: Mobility/safety with mobility;Balance;Proper use of DME OT goals addressed during session: ADL's and self-care      AM-PAC OT 6 Clicks Daily Activity     Outcome Measure   Help from another person eating meals?: None Help from another person taking care of personal grooming?: A Little Help from another person toileting, which includes using toliet, bedpan, or urinal?: A Little Help from another person bathing (including washing, rinsing, drying)?: A Lot Help from another person to put on and taking off regular upper body clothing?: A Little Help from  another person to put on and taking off regular lower body clothing?: A Lot 6 Click Score: 17    End of Session    OT Visit Diagnosis: Other abnormalities of gait and mobility (R26.89);Pain Pain - Right/Left: Left (abd; back)   Activity Tolerance Patient tolerated treatment well   Patient Left with call bell/phone within reach;in chair;with chair alarm set   Nurse Communication Mobility status;Precautions        Time: 1013-1050 OT Time Calculation (min): 37 min  Charges: OT General Charges $OT Visit: 1 Visit OT Treatments $Self Care/Home Management : 8-22 mins  Etta NOVAK, OT Acute Rehabilitation Services Office [phone] Secure Chat Preferred    Etta GORMAN Hope 10/07/2024, 1:14 PM

## 2024-10-07 NOTE — Progress Notes (Signed)
 PT Cancellation Note  Patient Details Name: Ashlee Gordon MRN: [id number] DOB: 1971/06/09   Cancelled Treatment:    Reason Eval/Treat Not Completed: Pain limiting ability to participate (pt stating back pain 8/10 and yet has trouble maintaining attention and conversation due to lethargy at this time. PT denied attempting any mobility even with education that it may improve pain.)   Giang Hemme B Peniel Hass 10/07/2024, 8:43 AM Lenoard SQUIBB, PT Acute Rehabilitation Services Office: [phone]

## 2024-10-07 NOTE — Progress Notes (Addendum)
 1000- Pt reports she is very uncomfortable in the standard ICU bed. Educated that she was in a Bariatric bed that was larger that may have allowed her to reposition better but she verbalizes she doesn't like that bed and requests not to have it. Educated that there is one other bed we could try, the standard floor bed. Pt verbalized she would like to try it to see If it would help. Standard floor bed was ordered from facilities.   1200-During transfer from chair back to bed for TCD Mathea c/o pain in the LUQ of abd. Dr. Gatha was made aware of pain this morning and determined it did not need any imaging.  Jadda and niece at bedside requested that some sort of imaging be done. Dr. Gatha again made aware of pain via secure chat. Abd US  was ordered, plan made with US  tech to obtain this afternoon since pt has to be NPO for 4 hours.   1335- Pt c/o chest pain, no changes on ECG. Dr. Gatha made aware and EKG ordered. Prior to pain Pt was laying on R shoulder. Pain reportedly radiates from R shoulder down to mid R breast. When palpating Pt cried out in pain. Dr. Gatha aware. US  at bedside to completed abd US  at this time.   1400-EKG obtained, Pt would not hold still long enough to get a clear read for EKG, however, the EKG that was obtained was sent to Dr. Gatha, reported that there was nothing acute on EKG.   1600- Pt verbalized that she was trying to get out of bed to get washed up. Pt encouraged to call staff before mobilizing to prevent falls since she is attached to the monitor and is in unfamiliar settings. Pt verbalzied understanding. Pt encouraged to get OOB to [facility] to attempt to void. Pt transferred with 2 assist to [facility]. Floor bed was available at this time so asked patient to stay on [facility] while the beds were switched. Pt verbalized understanding. While this RN was switching beds patient transferred herself to chair and kicked BSC in the process spilling urine. Pt was again educated on  importance of waiting for staff members before moving to prevent injury to herself or others. Pt verbalized understanding.  Pt was washed up in the chair and transferred with 2 assist back to new bed. Pt oriented to new bed and lock was placed on high of bed articulation to prevent patient from raising bed for patient safety. Pt was upset that lock was placed on bed so she could not raise it as it helps her back Pt educated that the hight of the bed does not affect the bed and that only moving the knees/HOB will affect body position in bed. Pt verbalized that she doesn't care, insisting that rasing the entire bed up helps her back. Educated Pt that unfortunately for her safety the lock must remain on since she is such a high fall risk. Pt was upset but verbalized understanding.

## 2024-10-07 NOTE — Plan of Care (Signed)
°  Problem: Education: Goal: Knowledge of disease or condition will improve Outcome: Progressing Goal: Knowledge of secondary prevention will improve (MUST DOCUMENT ALL) Outcome: Progressing Goal: Knowledge of patient specific risk factors will improve (DELETE if not current risk factor) Outcome: Progressing   Problem: Spontaneous Subarachnoid Hemorrhage Tissue Perfusion: Goal: Complications of Spontaneous Subarachnoid Hemorrhage will be minimized Outcome: Progressing   Problem: Coping: Goal: Will verbalize positive feelings about self Outcome: Progressing Goal: Will identify appropriate support needs Outcome: Progressing   Problem: Self-Care: Goal: Ability to participate in self-care as condition permits will improve Outcome: Progressing Goal: Verbalization of feelings and concerns over difficulty with self-care will improve Outcome: Progressing Goal: Ability to communicate needs accurately will improve Outcome: Progressing   Problem: Nutrition: Goal: Risk of aspiration will decrease Outcome: Progressing Goal: Dietary intake will improve Outcome: Progressing   Problem: Education: Goal: Ability to describe self-care measures that may prevent or decrease complications (Diabetes Survival Skills Education) will improve Outcome: Progressing Goal: Individualized Educational Video(s) Outcome: Progressing   Problem: Coping: Goal: Ability to adjust to condition or change in health will improve Outcome: Progressing   Problem: Fluid Volume: Goal: Ability to maintain a balanced intake and output will improve Outcome: Progressing   Problem: Metabolic: Goal: Ability to maintain appropriate glucose levels will improve Outcome: Progressing   Problem: Skin Integrity: Goal: Risk for impaired skin integrity will decrease Outcome: Progressing   Problem: Education: Goal: Knowledge of General Education information will improve Description: Including pain rating scale,  medication(s)/side effects and non-pharmacologic comfort measures Outcome: Progressing   Problem: Clinical Measurements: Goal: Ability to maintain clinical measurements within normal limits will improve Outcome: Progressing Goal: Will remain free from infection Outcome: Progressing Goal: Diagnostic test results will improve Outcome: Progressing Goal: Respiratory complications will improve Outcome: Progressing Goal: Cardiovascular complication will be avoided Outcome: Progressing   Problem: Coping: Goal: Level of anxiety will decrease Outcome: Progressing   Problem: Pain Managment: Goal: General experience of comfort will improve and/or be controlled Outcome: Progressing   Problem: Skin Integrity: Goal: Risk for impaired skin integrity will decrease Outcome: Progressing   Problem: Cardiovascular: Goal: Ability to achieve and maintain adequate cardiovascular perfusion will improve Outcome: Progressing Goal: Vascular access site(s) Level 0-1 will be maintained Outcome: Progressing

## 2024-10-07 NOTE — Progress Notes (Signed)
 [facility] ADULT ICU REPLACEMENT PROTOCOL   The patient does apply for the [facility] Adult ICU Electrolyte Replacment Protocol based on the criteria listed below:   1.Exclusion criteria: TCTS, ECMO, Dialysis, and Myasthenia Gravis patients 2. Is GFR >/= 30 ml/min? Yes.    Patient's GFR today is >60 3. Is SCr </= 2? Yes.   Patient's SCr is 1.09 mg/dL 4. Did SCr increase >/= 0.5 in 24 hours? No. 5.Pt's weight >40kg  Yes.   6. Abnormal electrolyte(s): K  7. Electrolytes replaced per protocol 8.  Call MD STAT for K+ </= 2.5, Phos </= 1, or Mag </= 1 Physician:  Haze Hunter [name] 10/07/2024 6:35 AM

## 2024-10-07 NOTE — Progress Notes (Signed)
 Awake and alert Alert and oriented with normal speech expression, fluency and comprehension. Visual fields are full to confrontation.   Face is symmetric. Strength in the arms and legs is symmetric with no drift.  Sensation is normal and symmetric. No ataxia. No inattention.   TCDs reviewed up to 12/12 - no evidence of spasm in visualized vessels.  A: Day 10 post SAH with no evidence of spasm P: OK to transfer to step down.  Possible DC day 12 if no changes.   I will arrange office follow  up in a month.

## 2024-10-07 NOTE — Progress Notes (Signed)
 Physical Therapy Treatment Patient Details Name: Ashlee Gordon MRN: [id number] DOB: September 19, 1971 Today's Date: 10/07/2024   History of Present Illness [name] adm 09/27/24 with sharp HA and emesis at [facility], CT (+) SAH, transfer to [facility] . CT angiogram Large aneurysm; 12/5 intubated for coiling embolization; extubated 12/6. PMHx: HTN, morbid obesity, prediabetes, depression, fibromyalgia, OA    PT Comments  Pt pleasant and willing to mobilize but requires significantly increased time to initiate transfers and mobility progression. Pt stating left sided abdominal pain and back pain as greatest limiting factors, no HA this date. Pt has to ascend 5 steps in home and states she has hx of back pain. Pt able to walk 68' with assist of RW and therapists this date with pt up to chair end of session. Given slow progression and increased time to initiate recommend HHPT and RW for D/C. Will continue to follow with pt encouraged to be up to chair and walking more with assist.     If plan is discharge home, recommend the following: Assist for transportation;Help with stairs or ramp for entrance   Can travel by private vehicle        Equipment Recommendations  Rolling walker (2 wheels) (bari)    Recommendations for Other Services       Precautions / Restrictions Precautions Precautions: Fall     Mobility  Bed Mobility Overal bed mobility: Needs Assistance Bed Mobility: Supine to Sit     Supine to sit: Min assist     General bed mobility comments: cues for safety, HOB 20 degrees, use of rail and HHA to elevate trunk to sitting, significantly increased time to initiate rise to sitting grossly 5 min    Transfers Overall transfer level: Needs assistance   Transfers: Sit to/from Stand Sit to Stand: Contact guard assist, +2 safety/equipment           General transfer comment: significantly increased time to initiate grossly 10 min    Ambulation/Gait Ambulation/Gait assistance: Min  assist, +2 safety/equipment Gait Distance (Feet): 60 Feet Assistive device: Rolling walker (2 wheels), 1 person hand held assist Gait Pattern/deviations: Step-through pattern, Decreased stride length   Gait velocity interpretation: <1.8 ft/sec, indicate of risk for recurrent falls   General Gait Details: pt walked 4' from bed to sink with HHA and mod cues for RW use which she declined to follow. Pt then used RW to walk into hallway with +2 safety as pt running into objects x 2, ditching RW and needing hand held assist to stabilize and move. 1 standing rest during gait   Stairs             Wheelchair Mobility     Tilt Bed    Modified Rankin (Stroke Patients Only) Modified Rankin (Stroke Patients Only) Pre-Morbid Rankin Score: Slight disability Modified Rankin: Moderately severe disability     Balance Overall balance assessment: Needs assistance Sitting-balance support: No upper extremity supported, Feet supported Sitting balance-Leahy Scale: Fair     Standing balance support: Bilateral upper extremity supported, During functional activity, Single extremity supported Standing balance-Leahy Scale: Poor Standing balance comment: UE support during mobility, static standing leaning against surfaces                            Communication Communication Communication: No apparent difficulties  Cognition Arousal: Alert Behavior During Therapy: Flat affect   PT - Cognitive impairments: Safety/Judgement  PT - Cognition Comments: pt with increased time, poor initiation and limited reception to education or cues. Poor safety awareness of transfers and RW use, frequently ditching RW to hold other items in environment Following commands: Impaired Following commands impaired: Follows one step commands with increased time, Follows one step commands inconsistently    Cueing Cueing Techniques: Verbal cues, Tactile cues, Visual cues   Exercises      General Comments        Pertinent Vitals/Pain Pain Assessment Pain Assessment: 0-10 Pain Score: 6  Pain Location: left abdomen, back Pain Descriptors / Indicators: Aching, Guarding, Discomfort Pain Intervention(s): Limited activity within patient's tolerance, Monitored during session, Repositioned, Premedicated before session    Home Living                          Prior Function            PT Goals (current goals can now be found in the care plan section) Progress towards PT goals: Progressing toward goals    Frequency    Min 2X/week      PT Plan      Co-evaluation PT/OT/SLP Co-Evaluation/Treatment: Yes Reason for Co-Treatment: For patient/therapist safety;To address functional/ADL transfers;Necessary to address cognition/behavior during functional activity PT goals addressed during session: Mobility/safety with mobility;Balance;Proper use of DME        AM-PAC PT 6 Clicks Mobility   Outcome Measure  Help needed turning from your back to your side while in a flat bed without using bedrails?: A Little Help needed moving from lying on your back to sitting on the side of a flat bed without using bedrails?: A Little Help needed moving to and from a bed to a chair (including a wheelchair)?: A Little Help needed standing up from a chair using your arms (e.g., wheelchair or bedside chair)?: A Lot Help needed to walk in hospital room?: A Lot Help needed climbing 3-5 steps with a railing? : Total 6 Click Score: 14    End of Session   Activity Tolerance: Patient tolerated treatment well Patient left: in chair;with call bell/phone within reach;with chair alarm set Nurse Communication: Mobility status PT Visit Diagnosis: Difficulty in walking, not elsewhere classified (R26.2);Other symptoms and signs involving the nervous system (R29.898)     Time: 1012-1050 PT Time Calculation (min) (ACUTE ONLY): 38 min  Charges:    $Gait Training:  8-22 mins $Therapeutic Activity: 8-22 mins PT General Charges $$ ACUTE PT VISIT: 1 Visit                     Lenoard SQUIBB, PT Acute Rehabilitation Services Office: [phone]    Lenoard NOVAK Marisue Canion 10/07/2024, 11:55 AM

## 2024-10-07 NOTE — Progress Notes (Signed)
 Pt transferred to [address].  Pain medications administered prior to transport.  Pt A&O x 4.  3W RN at bedside.  Pts belongings at bedside included: heating pad, cell phone, pocket book, glasses, clothing, lotions, Christmas gnome, and candy.  Reviewed all belongings at bedside with patient after transfer.  Pt confirmed all belongings.  Family/friends not at bedside during transfer.

## 2024-10-07 NOTE — Progress Notes (Signed)
 Pt refuses to get up to bedside commode. She is putting the bed very high from the floor ans in chair position by herself. Pt was leaning forward and almost fell from bed. Pt educated that she cannot do that cause it is unsafe. Pt was arguing about positioning herself but agreed for us  to help her and put bed in low position for her safety. Charge nurse notified of patient behavior.

## 2024-10-07 NOTE — Progress Notes (Signed)
Pt. Decided not to wear cpap tonight.

## 2024-10-07 NOTE — Progress Notes (Addendum)
 NAME:  Ashlee Gordon, MRN:  [id number], DOB:  1971-10-17, LOS: 10 ADMISSION DATE:  09/27/2024, CONSULTATION DATE:  12/5  REFERRING MD: Pamella CHIEF COMPLAINT:  Severe Headache   History of Present Illness:  Patient is a 53 year old female with significant past medical history of  who presented to [facility] ED after a sudden onset of a severe headache approximately around 0 530 on 12/5 due to [facility]. 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, 8-9 mm, paraphalamic segment was called by neurovascular 12/5   Pertinent  Medical History     sleep apnea, hypertension, obesity, prediabetes, depression, fibromyalgia, anemia, and osteoarthritis      Past Medical History:  Diagnosis Date   Anemia    Depression    Diabetes mellitus without complication (HCC)    Fibromyalgia    Hypertension    Infertility, female    Morbid obesity (HCC)    OSA (obstructive sleep apnea)    Osteoarthritis    SAH (subarachnoid hemorrhage) (HCC) 09/27/2024   SOBOE (shortness of breath on exertion)    Swelling of both lower extremities    Vitamin D  deficiency      Significant Hospital Events: Including procedures, antibiotic start and stop dates in addition to other pertinent events   12/5 PCCM admit- SAH due to Left ICA aneurysm, plan for coiling by NIR, eval for EVD 12/6 Extubated 12/7 art line x 2 then a 3rd overnight (prior ones malfunctioned) 12/8 significant headache 8/10 scale. Added gabapentin , Fioricet , PRN fentanyl . Also asking to try CPAP while in hospital. New SBP goal of < 200 per Dr. Lester 12/8 CT head with mild improvement in [facility] 12/12 persistent headache, hip and back pain -Lidoderm  patches, Xanax  added 12/15 - Transferred out of ICU. NIH liberalized to q 4 hours.  Interim History / Subjective:  Patient is doing well.  Lying in bed.  Hemodynamically stable.  She complains of generalized bodyaches.  Denies headache.  She was concerned about hernia on the left side of her  abdomen.  On my examination there was no reducible mass.  No bruise.   Objective    Blood pressure (!) 168/95, pulse 80, temperature 98.8 F (37.1 C), temperature source Oral, resp. rate 18, height 5' 7.99 (1.727 m), weight (!) 205.6 kg, last menstrual period 10/22/2020, SpO2 100%.        Intake/Output Summary (Last 24 hours) at 10/07/2024 0935 Last data filed at 10/07/2024 0500 Gross per 24 hour  Intake --  Output 300 ml  Net -300 ml   Filed Weights   09/27/24 1345 09/27/24 1630 09/28/24 0315  Weight: (!) 200 kg (!) 205.6 kg (!) 205.6 kg   Physical Exam: Morbidly obese all covered up in bed Somnolent but wakes up easily, follows command, good strength all 4 extremities S1-S2 distant  Labs show sodium improved to 138, mild hypokalemia  Assessment and Plan   Subarachnoid Hemorrhage (H/H 3) due to Left ICA Aneurysm  - s/p balloon assisted coiling 12/5; tortuous irregular walled vessel noted Ongoing headache - 2/2 above improving. HTN - off Cleviprex  12/10 Hx Depression, Fibromyalgia on PTA Bupropion . Class 3 obesity Prediabetes - Hgb A1c 6.1. Hypertension   Continue nimodipine  Vasospasm watch, TCD's per neurosurgery -TCD from today's pending. Mobilize as much as she will allow Pain appears better controlled with Fioricet , Lidoderm  patches, gabapentin  and fentanyl  for breakthrough.  Fibromyalgia context makes this difficult to interpret Continue Coreg , hydralazine , losartan  and norvasc  along with nimodipine . Use prn labetalol  for SBP goal  of < 160. Can go up on Losartan  if needed.  Polyuria -improved, hyponatremia improved Monitor sodium , continue salt tabs  OSA - continue nocturnal CPAP  I discussed with neurosurgery okay to decrease NIH stroke scales to every 4 hours.  Okay to transfer out of ICU.  TRH to pick up from 10/08/2024.  Tamela Stakes, MD  Attending Physician, Critical Care Medicine [facility] See Amion for pager If no response  to pager, please call [phone] until 7pm After 7pm, Please call E-link [phone]     10/07/2024, 9:35 AM

## 2024-10-08 ENCOUNTER — Other Ambulatory Visit (HOSPITAL_COMMUNITY): Payer: Self-pay

## 2024-10-08 ENCOUNTER — Inpatient Hospital Stay (HOSPITAL_COMMUNITY)

## 2024-10-08 LAB — BASIC METABOLIC PANEL WITH GFR
Anion gap: 8 (ref 5–15)
BUN: 9 mg/dL (ref 6–20)
CO2: 28 mmol/L (ref 22–32)
Calcium: 8.6 mg/dL — ABNORMAL LOW (ref 8.9–10.3)
Chloride: 102 mmol/L (ref 98–111)
Creatinine, Ser: 0.97 mg/dL (ref 0.44–1.00)
GFR, Estimated: >60 mL/min (ref >60)
Glucose, Bld: 107 mg/dL — ABNORMAL HIGH (ref 70–99)
Potassium: 3.5 mmol/L (ref 3.5–5.1)
Sodium: 138 mmol/L (ref 135–145)

## 2024-10-08 LAB — CBC
HCT: 36.9 % (ref 36.0–46.0)
Hemoglobin: 11.8 g/dL — ABNORMAL LOW (ref 12.0–15.0)
MCH: 27.4 pg (ref 26.0–34.0)
MCHC: 32 g/dL (ref 30.0–36.0)
MCV: 85.6 fL (ref 80.0–100.0)
Platelets: 367 K/uL (ref 150–400)
RBC: 4.31 MIL/uL (ref 3.87–5.11)
RDW: 14.6 % (ref 11.5–15.5)
WBC: 9.6 K/uL (ref 4.0–10.5)
nRBC: 0 % (ref 0.0–0.2)

## 2024-10-08 LAB — HEPATIC FUNCTION PANEL
ALT: 34 U/L (ref 0–44)
AST: 32 U/L (ref 15–41)
Albumin: 3.4 g/dL — ABNORMAL LOW (ref 3.5–5.0)
Alkaline Phosphatase: 200 U/L — ABNORMAL HIGH (ref 38–126)
Bilirubin, Direct: 0.1 mg/dL (ref 0.0–0.2)
Indirect Bilirubin: 0.3 mg/dL (ref 0.3–0.9)
Total Bilirubin: 0.5 mg/dL (ref 0.0–1.2)
Total Protein: 8 g/dL (ref 6.5–8.1)

## 2024-10-08 LAB — SODIUM: Sodium: 139 mmol/L (ref 135–145)

## 2024-10-08 MED ORDER — SODIUM CHLORIDE 0.9 % IV SOLN: 12.5000 mg | Freq: Four times a day (QID) | INTRAVENOUS | Status: DC | PRN

## 2024-10-08 MED ORDER — PROMETHAZINE HCL 25 MG RE SUPP: 25.0000 mg | Freq: Four times a day (QID) | RECTAL | Status: DC | PRN

## 2024-10-08 MED ORDER — PROMETHAZINE HCL 12.5 MG PO TABS
25.0000 mg | ORAL_TABLET | Freq: Four times a day (QID) | ORAL | Status: DC | PRN
  Administered 2024-10-08 – 2024-10-10 (×3): 25 mg via ORAL
  Filled 2024-10-08 (×3): qty 2

## 2024-10-08 MED ORDER — IOHEXOL 350 MG/ML SOLN
75.0000 mL | Freq: Once | INTRAVENOUS | Status: AC | PRN
  Administered 2024-10-08: 19:00:00 75 mL via INTRAVENOUS

## 2024-10-08 NOTE — Consult Note (Signed)
 Consult Note  Ashlee Gordon [birth year]  [id number].    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 She is s/p coiling of aneurysm. She was transferred out of the ICU yesterday. Complained of some generalized pain yesterday and abdominal US  done which showed concern for gallstone in the neck of the gallbladder. General surgery consulted due to concern for cholecystitis. Today patient reports abdominal pain is present of the left upper abdomen. She reports that the pain is new and started about a week ago. Does not associate the pain with eating. Denies nausea and vomiting. Last PO intake was around 4:00PM today.   PMH otherwise significant for HTN, Prediabetes, Depression, Fibromyalgia and Morbid Obesity. No prior abdominal surgery. Not on blood thinners. Denies tobacco use. Endorses prior mariajuana use however denies current use. Previously used Intolerance listed to metformin .   ROS: Per HPI  Family History  Problem Relation Age of Onset   Hypertension Mother    Diabetes Mother    Heart disease Mother    Kidney disease Mother    Sleep apnea Mother    Obesity Mother    Kidney disease Father    Heart disease Father    Breast cancer Neg Hx     Past Medical History:  Diagnosis Date   Anemia    Depression    Diabetes mellitus without complication (HCC)    Fibromyalgia    Hypertension    Infertility, female    Morbid obesity (HCC)    OSA (obstructive sleep apnea)    Osteoarthritis    SAH (subarachnoid hemorrhage) (HCC) 09/27/2024   SOBOE (shortness of breath on exertion)    Swelling of both lower extremities    Vitamin D  deficiency     Past Surgical History:  Procedure Laterality Date   IR ANGIO INTRA EXTRACRAN SEL INTERNAL CAROTID UNI L MOD SED  09/27/2024   IR ANGIOGRAM FOLLOW  UP STUDY  09/27/2024   IR ANGIOGRAM FOLLOW UP STUDY  09/27/2024   IR ANGIOGRAM FOLLOW UP STUDY  09/27/2024   IR ANGIOGRAM FOLLOW UP STUDY  09/27/2024   IR ANGIOGRAM FOLLOW UP STUDY  09/27/2024   IR INJECT/THERA/INC NEEDLE/CATH/PLC EPI/LUMB/SAC W/IMG  08/05/2021   IR TRANSCATH/EMBOLIZ  09/27/2024   IR US  GUIDE VASC ACCESS RIGHT  09/27/2024   IR US  GUIDE VASC ACCESS RIGHT  09/27/2024   RADIOLOGY WITH ANESTHESIA N/A 09/27/2024   Procedure: RADIOLOGY WITH ANESTHESIA;  Surgeon: Radiologist, Medication, MD;  Location: [facility] OR;  Service: Radiology;  Laterality: N/A;    Social History:  reports that she has never smoked. She has never used smokeless tobacco. She reports current alcohol use. No history on file for drug use.  Allergies: Allergies[1]  Medications Prior to Admission  Medication Sig Dispense Refill   Cyanocobalamin (B-12 COMPLIANCE INJECTION) 1000 MCG/ML KIT Inject 1,000 mcg as directed See admin instructions. Only at doctor's appointments     HYDROcodone-acetaminophen  (NORCO) 10-325 MG tablet Take 1 tablet by mouth 3 (three) times daily as needed for severe pain (pain score 7-10) or moderate pain (pain score 4-6).     losartan  (COZAAR ) 50 MG tablet Take 50 mg by mouth daily.     buPROPion  (WELLBUTRIN  XL) 150 MG 24 hr tablet Take 1 tablet (150 mg total) by mouth daily. (Patient not taking: Reported on 10/04/2024) 90  tablet 0   Cholecalciferol (VITAMIN D3) 50 MCG (2000 UT) capsule Take 1 capsule (2,000 Units total) by mouth daily. (Patient not taking: Reported on 03/21/2024)     cyclobenzaprine  (FLEXERIL ) 10 MG tablet Take 1 tablet (10 mg total) by mouth 2 (two) times daily as needed for muscle spasms. (Patient not taking: Reported on 10/04/2024) 20 tablet 0   ergocalciferol  (VITAMIN D2) 1.25 MG (50000 UT) capsule Take 1 capsule (50,000 Units total) by mouth once a week. (Patient not taking: Reported on 10/04/2024) 12 capsule 0   Multiple Vitamin (MULTIVITAMIN ADULT PO) Take by mouth. (Patient not  taking: Reported on 10/04/2024)     nebivolol  (BYSTOLIC ) 2.5 MG tablet Take 1 tablet (2.5 mg total) by mouth daily. (Patient not taking: Reported on 10/04/2024) 90 tablet 0    Blood pressure (!) 109/57, pulse 91, temperature 98.1 F (36.7 C), temperature source Oral, resp. rate 17, height 5' 7.99 (1.727 m), weight (!) 205.6 kg, last menstrual period 10/22/2020, SpO2 93%. Physical Exam: Physical exam conducted in conjunction with Vertell Pringle, PA-C General: Pleasant obese female who is laying in bed in NAD. HEENT: Head is normocephalic, atraumatic.  Sclera are noninjected. Ears and nose without any masses or lesions.  Mouth is pink and moist. Heart: HR normal during encounter.  Lungs: Respiratory effort somewhat labored as patient just had treatment for her left hip pain. Abd: Soft, ND. Tenderness to palpation of RUQ and left upper quadrant with deep palpation. No frank peritonitis.  MS: Able to move all 4 extremities.  Skin: Warm and dry.  Psych: A&Ox3 with an appropriate affect.   Results for orders placed or performed during the hospital encounter of 09/27/24 (from the past 48 hours)  Sodium     Status: None   Collection Time: 10/06/24  6:05 PM  Result Value Ref Range   Sodium 143 135 - 145 mmol/L    Comment: Performed at [facility], [address], [place], [place] [address]  Basic metabolic panel     Status: Abnormal   Collection Time: 10/07/24  5:29 AM  Result Value Ref Range   Sodium 139 135 - 145 mmol/L   Potassium 3.7 3.5 - 5.1 mmol/L   Chloride 104 98 - 111 mmol/L   CO2 26 22 - 32 mmol/L   Glucose, Bld 103 (H) 70 - 99 mg/dL    Comment: Glucose reference range applies only to samples taken after fasting for at least 8 hours.   BUN 10 6 - 20 mg/dL   Creatinine, Ser 8.90 (H) 0.44 - 1.00 mg/dL   Calcium 8.5 (L) 8.9 - 10.3 mg/dL   GFR, Estimated >39 >39 mL/min    Comment: (NOTE) Calculated using the CKD-EPI Creatinine Equation (2021)    Anion gap 9 5 - 15     Comment: Performed at [facility], [address] [place], [place] [address]  CBC     Status: Abnormal   Collection Time: 10/07/24  5:29 AM  Result Value Ref Range   WBC 10.7 (H) 4.0 - 10.5 K/uL   RBC 4.45 3.87 - 5.11 MIL/uL   Hemoglobin 12.0 12.0 - 15.0 g/dL   HCT 62.1 63.9 - 53.9 %   MCV 84.9 80.0 - 100.0 fL   MCH 27.0 26.0 - 34.0 pg   MCHC 31.7 30.0 - 36.0 g/dL   RDW 85.4 88.4 - 84.4 %   Platelets 323 150 - 400 K/uL   nRBC 0.0 0.0 - 0.2 %  Comment: Performed at [facility], [address], [place], [place] [address]  Magnesium      Status: None   Collection Time: 10/07/24  5:29 AM  Result Value Ref Range   Magnesium  2.1 1.7 - 2.4 mg/dL    Comment: Performed at [facility], [address], [place], [place] [address]  Phosphorus     Status: None   Collection Time: 10/07/24  5:29 AM  Result Value Ref Range   Phosphorus 3.6 2.5 - 4.6 mg/dL    Comment: Performed at [facility], [address] [place], [place] [address]  Sodium     Status: None   Collection Time: 10/07/24  7:18 PM  Result Value Ref Range   Sodium 137 135 - 145 mmol/L    Comment: Performed at [facility], [address], [place], [place] [address]  Sodium     Status: None   Collection Time: 10/08/24  4:59 AM  Result Value Ref Range   Sodium 139 135 - 145 mmol/L    Comment: Performed at [facility], [address], [place], [place] [address]  Basic metabolic panel with GFR     Status: Abnormal   Collection Time: 10/08/24  7:46 AM  Result Value Ref Range   Sodium 138 135 - 145 mmol/L   Potassium 3.5 3.5 - 5.1 mmol/L   Chloride 102 98 - 111 mmol/L   CO2 28 22 - 32 mmol/L   Glucose, Bld 107 (H) 70 - 99 mg/dL    Comment: Glucose reference range applies only to samples taken after fasting for at least 8 hours.   BUN 9 6 - 20 mg/dL   Creatinine, Ser 9.02 0.44 - 1.00 mg/dL   Calcium 8.6 (L) 8.9 - 10.3 mg/dL   GFR, Estimated >39 >39 mL/min    Comment: (NOTE) Calculated  using the CKD-EPI Creatinine Equation (2021)    Anion gap 8 5 - 15    Comment: Performed at [facility], [address] [place], [place] [address]  CBC     Status: Abnormal   Collection Time: 10/08/24  7:46 AM  Result Value Ref Range   WBC 9.6 4.0 - 10.5 K/uL   RBC 4.31 3.87 - 5.11 MIL/uL   Hemoglobin 11.8 (L) 12.0 - 15.0 g/dL   HCT 63.0 63.9 - 53.9 %   MCV 85.6 80.0 - 100.0 fL   MCH 27.4 26.0 - 34.0 pg   MCHC 32.0 30.0 - 36.0 g/dL   RDW 85.3 88.4 - 84.4 %   Platelets 367 150 - 400 K/uL   nRBC 0.0 0.0 - 0.2 %    Comment: Performed at [facility], [address], [place], [place] [address]   VAS US  TRANSCRANIAL DOPPLER Result Date: 10/08/2024  Transcranial Doppler Patient Name:  MAKAELYN APONTE  Date of Exam:   10/07/2024 Medical Rec #: [id number]      Accession #:    [id number] Date of Birth: 06-Aug-1971     Patient Gender: F Patient Age:   30 years Exam Location:  [facility] Procedure:      VAS US  TRANSCRANIAL DOPPLER Referring Phys: FONDA SHARPS --------------------------------------------------------------------------------  Indications: Subarachnoid hemorrhage. 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  Limitations: patient movement and talking Comparison Study: Previous exam was on 10/04/2024 Performing Technologist: Ezzie Potters RVT, RDMS  Examination Guidelines: A complete evaluation includes B-mode imaging, spectral Doppler,  color Doppler, and power Doppler as needed of all accessible portions of each vessel. Bilateral testing is considered an integral part of a complete examination. Limited examinations for reoccurring indications may be performed as noted.  +----------+---------------+----------+-----------+------------------+ RIGHT TCD Right VM (cm/s)Depth (cm)Pulsatility     Comment       +----------+---------------+----------+-----------+------------------+ MCA             101         4.80      1.22                        +----------+---------------+----------+-----------+------------------+ ACA             -25                   1.25                       +----------+---------------+----------+-----------+------------------+ Term ICA        71                    1.14                       +----------+---------------+----------+-----------+------------------+ PCA P1                                        unable to insonate +----------+---------------+----------+-----------+------------------+ Opthalmic       16                    1.45                       +----------+---------------+----------+-----------+------------------+ ICA siphon      34                    1.21                       +----------+---------------+----------+-----------+------------------+ Vertebral       -30                   1.08                       +----------+---------------+----------+-----------+------------------+ Distal ICA      34                    1.07                       +----------+---------------+----------+-----------+------------------+  +----------+--------------+----------+-----------+-------+ LEFT TCD  Left VM (cm/s)Depth (cm)PulsatilityComment +----------+--------------+----------+-----------+-------+ MCA             93         5.40      1.08            +----------+--------------+----------+-----------+-------+ ACA            -16                   0.90            +----------+--------------+----------+-----------+-------+ Term ICA        68                   0.89            +----------+--------------+----------+-----------+-------+  PCA P1          29                   0.82            +----------+--------------+----------+-----------+-------+ Opthalmic       15                   1.65            +----------+--------------+----------+-----------+-------+ ICA siphon      30                   1.28             +----------+--------------+----------+-----------+-------+ Vertebral      -43                   0.95            +----------+--------------+----------+-----------+-------+ Distal ICA      26                   1.15            +----------+--------------+----------+-----------+-------+  +------------+-------+------------------+             VM cm/s     Comment       +------------+-------+------------------+ Prox Basilar       unable to insonate +------------+-------+------------------+ Dist Basilar       unable to insonate +------------+-------+------------------+ +----------------------+----+ Right Lindegaard Ratio2.97 +----------------------+----+ +---------------------+----+ Left Lindegaard Ratio3.58 +---------------------+----+  Summary:  Elevated right middle cerebral artery mean flow velocities suggest mild vasospasm and elevate dleft MCA mean flow velocities suggest borderline mild vasospasm. basilar artery not insonated due to technical difficuluties from suboptimal suboccipital window. *See table(s) above for TCD measurements and observations.  Diagnosing physician: Eather Popp MD Electronically signed by Eather Popp MD on 10/08/2024 at 11:26:48 AM.    Final    US  Abdomen Complete Result Date: 10/07/2024 EXAM: COMPLETE ABDOMINAL ULTRASOUND TECHNIQUE: Real-time ultrasonography of the abdomen was performed. COMPARISON: CT abdomen and pelvis 08/09/2022. CLINICAL HISTORY: Abdominal pain. FINDINGS: LIVER: The liver demonstrates normal echogenicity. No intrahepatic biliary ductal dilatation. No mass. BILIARY SYSTEM: Cholelithiasis with a 2.8 cm stone in the gallbladder neck. No gallbladder wall thickening or edema. Murphy sign is positive. Common bile duct diameter measures 3 mm, which is normal. KIDNEYS: The right kidney measures 11 cm in length, and the left kidney measures 10.5 cm in length. Normal cortical echogenicity. No hydronephrosis. No calculus. No mass. PANCREAS: The  pancreas is not well visualized due to body habitus, but it appears grossly unremarkable. SPLEEN: The spleen is not well visualized due to body habitus, but it appears grossly unremarkable. Spleen is within normal limits in size. VESSELS: The aorta is not well visualized due to body habitus, but it appears grossly unremarkable. Visualized portion of the inferior vena cava is normal. OTHER: No ascites. IMPRESSION: 1. Cholelithiasis with a 2.8 cm stone in the gallbladder neck and a positive sonographic Murphy sign, without gallbladder wall thickening or edema. Appearance suggests acute cholecystitis in the appropriate clinical setting. Electronically signed by: Elsie Gravely MD 10/07/2024 02:27 PM EST RP Workstation: [id number]      Assessment/Plan Abdominal pain Probable acute cholecystitis  - RUQ US  with 2.8 cm stone in the gallbladder neck with positive Murphy sign, no gallbladder wall thickening or edema - No leukocytosis currently, afebrile and HD stable. - LFTs not checked. These are pending. - On exam, patient is tender to  deep palpation of ruq and luq. However, not convinced that this is directly correlated with her gallbladder. - Discussed labs, imaging, history and findings with patient. Recommend getting a HIDA scan to further evaluate. Explained to patient that she will need to be NPO for 6 hours and to refrain from having IV narcotic medication. Patient agrees with plan and seems to understand. All questions were answered. - Will continue to follow.   FEN: NPO at midnight; IVF per primary VTE: Heparin  injections ID: None recommended at this time.   - per TRH -  Recent SAH due to L ICA aneurysm HTN Prediabetes Depression Fibromyalgia Morbid Obesity   I reviewed hospitalist notes, nursing notes, consulting provider notes, Consultant Neurosurgery notes, hospitalist notes, last 24 h vitals and pain scores, last 48 h intake and output, last 24 h labs and trends, and last 24 h  imaging results.  This care required high  level of medical decision making.   Marjorie Favre, [facility] 10/08/2024, 3:45 PM Please see Amion for pager number during day hours 7:00am-4:30pm      [1]  Allergies Allergen Reactions   Metformin  And Related Nausea Only

## 2024-10-08 NOTE — Plan of Care (Signed)
 Problem: Education: Goal: Knowledge of disease or condition will improve Outcome: Progressing Goal: Knowledge of secondary prevention will improve (MUST DOCUMENT ALL) Outcome: Progressing Goal: Knowledge of patient specific risk factors will improve (DELETE if not current risk factor) Outcome: Progressing   Problem: Spontaneous Subarachnoid Hemorrhage Tissue Perfusion: Goal: Complications of Spontaneous Subarachnoid Hemorrhage will be minimized Outcome: Progressing   Problem: Coping: Goal: Will verbalize positive feelings about self Outcome: Progressing Goal: Will identify appropriate support needs Outcome: Progressing   Problem: Health Behavior/Discharge Planning: Goal: Ability to manage health-related needs will improve Outcome: Progressing Goal: Goals will be collaboratively established with patient/family Outcome: Progressing   Problem: Self-Care: Goal: Ability to participate in self-care as condition permits will improve Outcome: Progressing Goal: Verbalization of feelings and concerns over difficulty with self-care will improve Outcome: Progressing Goal: Ability to communicate needs accurately will improve Outcome: Progressing   Problem: Nutrition: Goal: Risk of aspiration will decrease Outcome: Progressing Goal: Dietary intake will improve Outcome: Progressing   Problem: Education: Goal: Ability to describe self-care measures that may prevent or decrease complications (Diabetes Survival Skills Education) will improve Outcome: Progressing Goal: Individualized Educational Video(s) Outcome: Progressing   Problem: Coping: Goal: Ability to adjust to condition or change in health will improve Outcome: Progressing   Problem: Fluid Volume: Goal: Ability to maintain a balanced intake and output will improve Outcome: Progressing   Problem: Health Behavior/Discharge Planning: Goal: Ability to identify and utilize available resources and services will  improve Outcome: Progressing Goal: Ability to manage health-related needs will improve Outcome: Progressing   Problem: Metabolic: Goal: Ability to maintain appropriate glucose levels will improve Outcome: Progressing   Problem: Nutritional: Goal: Maintenance of adequate nutrition will improve Outcome: Progressing Goal: Progress toward achieving an optimal weight will improve Outcome: Progressing   Problem: Skin Integrity: Goal: Risk for impaired skin integrity will decrease Outcome: Progressing   Problem: Tissue Perfusion: Goal: Adequacy of tissue perfusion will improve Outcome: Progressing   Problem: Education: Goal: Knowledge of General Education information will improve Description: Including pain rating scale, medication(s)/side effects and non-pharmacologic comfort measures Outcome: Progressing   Problem: Health Behavior/Discharge Planning: Goal: Ability to manage health-related needs will improve Outcome: Progressing   Problem: Clinical Measurements: Goal: Ability to maintain clinical measurements within normal limits will improve Outcome: Progressing Goal: Will remain free from infection Outcome: Progressing Goal: Diagnostic test results will improve Outcome: Progressing Goal: Respiratory complications will improve Outcome: Progressing Goal: Cardiovascular complication will be avoided Outcome: Progressing   Problem: Activity: Goal: Risk for activity intolerance will decrease Outcome: Progressing   Problem: Nutrition: Goal: Adequate nutrition will be maintained Outcome: Progressing   Problem: Coping: Goal: Level of anxiety will decrease Outcome: Progressing   Problem: Elimination: Goal: Will not experience complications related to bowel motility Outcome: Progressing Goal: Will not experience complications related to urinary retention Outcome: Progressing   Problem: Pain Managment: Goal: General experience of comfort will improve and/or be  controlled Outcome: Progressing   Problem: Safety: Goal: Ability to remain free from injury will improve Outcome: Progressing   Problem: Skin Integrity: Goal: Risk for impaired skin integrity will decrease Outcome: Progressing   Problem: Education: Goal: Understanding of CV disease, CV risk reduction, and recovery process will improve Outcome: Progressing Goal: Individualized Educational Video(s) Outcome: Progressing   Problem: Activity: Goal: Ability to return to baseline activity level will improve Outcome: Progressing   Problem: Cardiovascular: Goal: Ability to achieve and maintain adequate cardiovascular perfusion will improve Outcome: Progressing Goal: Vascular access site(s) Level 0-1 will be maintained  Outcome: Progressing   Problem: Health Behavior/Discharge Planning: Goal: Ability to safely manage health-related needs after discharge will improve Outcome: Progressing

## 2024-10-08 NOTE — Plan of Care (Signed)
  Problem: Education: Goal: Knowledge of disease or condition will improve Outcome: Not Progressing   Problem: Health Behavior/Discharge Planning: Goal: Ability to manage health-related needs will improve Outcome: Not Progressing   Problem: Self-Care: Goal: Ability to participate in self-care as condition permits will improve Outcome: Not Progressing   

## 2024-10-08 NOTE — Progress Notes (Signed)
 CM asked pharmacy to check cost of nimotop  for home. The response from her insurance is:  Prior authorization required with step therapy showing she has tried and failed 2 of the insurance preferred meds: amlodipine , nifedipine, nifedipine ER tablet, Norliqva  Solution   MD updated.

## 2024-10-08 NOTE — Progress Notes (Signed)
 Spoke to NM regarding patients scan tomorrow. Pt will be NPO at MN with NO OPIOIDS AFTER MIDNIGHT as well. Will pass along in report

## 2024-10-08 NOTE — TOC Progression Note (Signed)
 Transition of Care [facility]) - Progression Note    Patient Details  Name: Ashlee Gordon MRN: [id number] Date of Birth: Mar 16, 1971  [facility] CM/SW Contact  Andrez JULIANNA George, RN Phone Number: 10/08/2024, 1:45 PM  Clinical Narrative:     Current recommendations are for [facility] services. No HH has accepted. Pt is willing to do outpatient therapy if the PT is Aquatic. CM has sent the referral to [facility] for Aquatic therapy. OT referral sent to [facility].  Bariatric walker and BSC ordered through [employer] and will be delivered to the room. CM will send in forms to [employer] for aides in the home. [employer] will contact the patient on whether this is approved.  Transportation information through her medicaid added to her AVS.   IP Care management following.  Expected Discharge Plan: OP Rehab Barriers to Discharge: Continued Medical Work up               Expected Discharge Plan and Services   Discharge Planning Services: CM Consult Post Acute Care Choice: Durable Medical Equipment Living arrangements for the past 2 months: Mobile Home                                       Social Drivers of Health (SDOH) Interventions SDOH Screenings   Depression (PHQ2-9): Medium Risk (11/22/2022)  Social Connections: Unknown (07/12/2022)   Received from [facility]  Tobacco Use: Low Risk (09/27/2024)    Readmission Risk Interventions     No data to display

## 2024-10-08 NOTE — Progress Notes (Signed)
 PROGRESS NOTE    Ashlee Gordon  FMW:[id number] DOB: 1971/05/11 DOA: 09/27/2024 PCP: [facility]  Chief Complaint  Patient presents with   Headache   Emesis    Brief Narrative:    53 year old female with significant past medical history of  who [facility] to [facility] ED after Ashlee Gordon sudden onset of Ashlee Gordon severe headache approximately around 0530 on 12/5 due to [facility].  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 Added gabapentin , Fioricet , PRN fentanyl . Also asking to try CPAP while in hospital. New SBP goal of < 200 per Dr. Lester 12/8 CT head with mild improvement in [facility] 12/12 persistent headache, hip and back pain -Lidoderm  patches, Xanax  added 12/15 - Transferred out of ICU. NIH liberalized to q 4 hours. 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  CT 12/6 with extensive SAH.   12/8 CT with mild interval improvement of SAH.  TCD 12/15 with elevated R middle cerebral artery mean flow velocities suggesting mild vasospasm and elveated L MCA mean flow velocities suggest borderline mild vasospasm.   12/15 neuro IR note, possible d/c day 12 if no changes  Hypertension Hydralazine , coreg , amlodipine , losartan , nimodipine  Goal < 160  Abdominal Pain C/o generalized pain RUQ US  shows possible cholecystitis Will c/s general surgery  Depression  Fibromyalgia Bupropion     Prediabetes Noted  Obesity, Class III Body mass index is 68.94 kg/m.    DVT prophylaxis: heparin  Code Status: full Family Communication: none Disposition:   Status is: Inpatient Remains inpatient appropriate because: need for continued inpatient care   Consultants:  PCCM Neuro IR  Procedures:   Balloon assisted coil embolization of ruptured left internal carotid artery aneurysm from femoral access, placement of right radial Juliany Daughety line with ultrasound guidance.  Antimicrobials:  Anti-infectives (From admission, onward)    None       Subjective: C/o abdominal pain  Objective: Vitals:   10/07/24 1946 10/08/24 0012 10/08/24 0441 10/08/24 1136  BP: (!) 162/77 (!) 162/102 (!) 143/78 (!) 155/97  Pulse: 86 92 80 94  Resp: 17 19 18 19   Temp: 98.7 F (37.1 C) 98.6 F (37 C) 98.7 F (37.1 C) 99.3 F (37.4 C)  TempSrc: Oral Oral Oral Oral  SpO2: 98% 100% 95% 95%  Weight:      Height:        Intake/Output Summary (Last 24 hours) at 10/08/2024 1510 Last data filed at 10/08/2024 9366 Gross per 24 hour  Intake --  Output 400 ml  Net -400 ml   Filed Weights   09/27/24 1345 09/27/24 1630 09/28/24 0315  Weight: (!) 200 kg (!) 205.6 kg (!) 205.6 kg    Examination:  General exam: morbidly obese, appears sedated (related to pain meds) Respiratory system: unlabored Cardiovascular system: S1 & S2 heard, RRR. No JVD, murmurs, rubs, gallops or clicks. No pedal edema. Gastrointestinal system: c/o pain all over, seems more impressive than exam Central  nervous system: Alert, but seems sedated (due to recent pain meds?). Moves all extremities with equal strength. Extremities: no LEE   Data Reviewed: I have personally reviewed following labs and imaging studies  CBC: Recent Labs  Lab 10/04/24 0422 10/05/24 0645 10/06/24 0420 10/07/24 0529 10/08/24 0746  WBC 13.1* 12.4* 12.9* 10.7* 9.6  HGB 11.5* 11.7* 12.0 12.0 11.8*  HCT 36.2 36.6 37.2 37.8 36.9  MCV 85.6  86.3 84.9 84.9 85.6  PLT 263 251 299 323 367    Basic Metabolic Panel: Recent Labs  Lab 10/02/24 0346 10/02/24 1804 10/03/24 0200 10/03/24 1921 10/04/24 0422 10/04/24 1856 10/05/24 0645 10/05/24 1904 10/06/24 0420 10/06/24 1805 10/07/24 0529 10/07/24 1918 10/08/24 0459 10/08/24 0746  NA 138   < > 138   < > 137   < > 136   < > 138 143 139 137 139 138  K 4.6  --  3.6  --  3.6  --  3.6  --  3.4*  --  3.7  --   --  3.5  CL 105  --  105  --  102  --  103  --  100  --  104  --   --  102  CO2 26  --  27  --  25  --  26  --  25  --  26  --   --  28  GLUCOSE 110*  --  128*  --  132*  --  117*  --  116*  --  103*  --   --  107*  BUN 12  --  12  --  10  --  7  --  8  --  10  --   --  9  CREATININE 1.05*  --  1.02*  --  1.06*  --  0.97  --  1.15*  --  1.09*  --   --  0.97  CALCIUM 7.9*  --  7.9*  --  8.1*  --  8.3*  --  8.5*  --  8.5*  --   --  8.6*  MG 2.2  --  2.0  --  2.1  --   --   --   --   --  2.1  --   --   --   PHOS 3.5  --  4.0  --  3.2  --   --   --   --   --  3.6  --   --   --    < > = values in this interval not displayed.    GFR: Estimated Creatinine Clearance: 129.2 mL/min (by C-G formula based on SCr of 0.97 mg/dL).  Liver Function Tests: No results for input(s): AST, ALT, ALKPHOS, BILITOT, PROT, ALBUMIN in the last 168 hours.  CBG: Recent Labs  Lab 10/01/24 1623 10/01/24 1925  GLUCAP 116* 177*     No results found for this or any previous visit (from the past 240 hours).       Radiology Studies: VAS US  TRANSCRANIAL DOPPLER Result Date: 10/08/2024  Transcranial Doppler Patient Name:  Ashlee Gordon  Date of Exam:   10/07/2024 Medical Rec #: [id number]      Accession #:    [id number] Date of Birth: [birth year]     Patient Gender: F Patient Age:   33 years Exam Location:  [facility] Procedure:      VAS US  TRANSCRANIAL DOPPLER Referring Phys: Ashlee Gordon --------------------------------------------------------------------------------   Indications: Subarachnoid  hemorrhage. 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  Limitations: patient movement and talking Comparison Study: Previous exam was on 10/04/2024 Performing Technologist: Ashlee Gordon RVT, RDMS  Examination Guidelines: Ashlee Gordon complete evaluation includes B-mode imaging, spectral Doppler, color Doppler, and power Doppler as needed of all accessible portions of each vessel. Bilateral testing is considered an integral part of Ashlee Gordon complete examination. Limited examinations for reoccurring indications may be performed as noted.  +----------+---------------+----------+-----------+------------------+ RIGHT TCD Right VM (cm/s)Depth (cm)Pulsatility     Comment       +----------+---------------+----------+-----------+------------------+ MCA             101         4.80      1.22                       +----------+---------------+----------+-----------+------------------+ ACA             -25                   1.25                       +----------+---------------+----------+-----------+------------------+ Term ICA        71                    1.14                       +----------+---------------+----------+-----------+------------------+ PCA P1                                        unable to insonate +----------+---------------+----------+-----------+------------------+ Opthalmic       16                    1.45                       +----------+---------------+----------+-----------+------------------+ ICA siphon      34                    1.21                       +----------+---------------+----------+-----------+------------------+ Vertebral       -30                   1.08                       +----------+---------------+----------+-----------+------------------+ Distal ICA      34                    1.07                       +----------+---------------+----------+-----------+------------------+   +----------+--------------+----------+-----------+-------+ LEFT TCD  Left VM (cm/s)Depth (cm)PulsatilityComment +----------+--------------+----------+-----------+-------+ MCA             93         5.40      1.08            +----------+--------------+----------+-----------+-------+ ACA            -16                   0.90            +----------+--------------+----------+-----------+-------+  Term ICA        68                   0.89            +----------+--------------+----------+-----------+-------+ PCA P1          29                   0.82            +----------+--------------+----------+-----------+-------+ Opthalmic       15                   1.65            +----------+--------------+----------+-----------+-------+ ICA siphon      30                   1.28            +----------+--------------+----------+-----------+-------+ Vertebral      -43                   0.95            +----------+--------------+----------+-----------+-------+ Distal ICA      26                   1.15            +----------+--------------+----------+-----------+-------+  +------------+-------+------------------+             VM cm/s     Comment       +------------+-------+------------------+ Prox Basilar       unable to insonate +------------+-------+------------------+ Dist Basilar       unable to insonate +------------+-------+------------------+ +----------------------+----+ Right Lindegaard Ratio2.97 +----------------------+----+ +---------------------+----+ Left Lindegaard Ratio3.58 +---------------------+----+  Summary:  Elevated right middle cerebral artery mean flow velocities suggest mild vasospasm and elevate dleft MCA mean flow velocities suggest borderline mild vasospasm. basilar artery not insonated due to technical difficuluties from suboptimal suboccipital window. *See table(s) above for TCD measurements and observations.  Diagnosing physician: Eather Popp MD Electronically signed by Eather Popp MD on 10/08/2024 at 11:26:48 AM.    Final    US  Abdomen Complete Result Date: 10/07/2024 EXAM: COMPLETE ABDOMINAL ULTRASOUND TECHNIQUE: Real-time ultrasonography of the abdomen was performed. COMPARISON: CT abdomen and pelvis 08/09/2022. CLINICAL HISTORY: Abdominal pain. FINDINGS: LIVER: The liver demonstrates normal echogenicity. No intrahepatic biliary ductal dilatation. No mass. BILIARY SYSTEM: Cholelithiasis with Ashlee Gordon 2.8 cm stone in the gallbladder neck. No gallbladder wall thickening or edema. Murphy sign is positive. Common bile duct diameter measures 3 mm, which is normal. KIDNEYS: The right kidney measures 11 cm in length, and the left kidney measures 10.5 cm in length. Normal cortical echogenicity. No hydronephrosis. No calculus. No mass. PANCREAS: The pancreas is not well visualized due to body habitus, but it appears grossly unremarkable. SPLEEN: The spleen is not well visualized due to body habitus, but it appears grossly unremarkable. Spleen is within normal limits in size. VESSELS: The aorta is not well visualized due to body habitus, but it appears grossly unremarkable. Visualized portion of the inferior vena cava is normal. OTHER: No ascites. IMPRESSION: 1. Cholelithiasis with Ashlee Gordon 2.8 cm stone in the gallbladder neck and Ashlee Gordon positive sonographic Murphy sign, without gallbladder wall thickening or edema. Appearance suggests acute cholecystitis in the appropriate clinical setting. Electronically signed by: Ashlee Gravely MD 10/07/2024 02:27 PM EST RP Workstation: [id number]        Scheduled Meds:  amLODipine   10 mg Oral  Daily   aspirin   81 mg Oral Daily   buPROPion   150 mg Oral Daily   carvedilol   25 mg Oral BID WC   Chlorhexidine  Gluconate Cloth  6 each Topical Daily   feeding supplement (GLUCERNA SHAKE)  237 mL Oral TID BM   gabapentin   300 mg Oral TID   heparin  injection (subcutaneous)  5,000 Units Subcutaneous Q8H   hydrALAZINE   100 mg  Oral Q8H   lidocaine   2 patch Transdermal Daily   losartan   50 mg Oral Daily   multivitamin with minerals  1 tablet Oral Daily   niMODipine   60 mg Oral Q4H   Or   niMODipine   60 mg Per Tube Q4H   nystatin    Topical BID   pantoprazole   40 mg Oral Daily   polyethylene glycol  17 g Oral Daily   senna-docusate  1 tablet Oral BID   Continuous Infusions:  promethazine  (PHENERGAN ) injection (IM or IVPB)       LOS: 11 days    Time spent: over 30 min    Meliton Monte, MD [facility]   To contact the attending provider between 7A-7P or the covering provider during after hours 7P-7A, please log into the web site [email or web address] and access using universal [place] password for that web site. If you do not have the password, please call the hospital operator.  10/08/2024, 3:10 PM

## 2024-10-09 ENCOUNTER — Inpatient Hospital Stay (HOSPITAL_COMMUNITY)

## 2024-10-09 DIAGNOSIS — I609 Nontraumatic subarachnoid hemorrhage, unspecified: Secondary | ICD-10-CM | POA: Diagnosis not present

## 2024-10-09 LAB — COMPREHENSIVE METABOLIC PANEL WITH GFR
ALT: 33 U/L (ref 0–44)
AST: 33 U/L (ref 15–41)
Albumin: 3.3 g/dL — ABNORMAL LOW (ref 3.5–5.0)
Alkaline Phosphatase: 187 U/L — ABNORMAL HIGH (ref 38–126)
Anion gap: 14 (ref 5–15)
BUN: 11 mg/dL (ref 6–20)
CO2: 21 mmol/L — ABNORMAL LOW (ref 22–32)
Calcium: 9.3 mg/dL (ref 8.9–10.3)
Chloride: 102 mmol/L (ref 98–111)
Creatinine, Ser: 1 mg/dL (ref 0.44–1.00)
GFR, Estimated: >60 mL/min (ref >60)
Glucose, Bld: 103 mg/dL — ABNORMAL HIGH (ref 70–99)
Potassium: 4.3 mmol/L (ref 3.5–5.1)
Sodium: 137 mmol/L (ref 135–145)
Total Bilirubin: 0.5 mg/dL (ref 0.0–1.2)
Total Protein: 7.6 g/dL (ref 6.5–8.1)

## 2024-10-09 LAB — CBC WITH DIFFERENTIAL/PLATELET
Abs Immature Granulocytes: 0.04 K/uL (ref 0.00–0.07)
Basophils Absolute: 0 K/uL (ref 0.0–0.1)
Basophils Relative: 0 %
Eosinophils Absolute: 0.3 K/uL (ref 0.0–0.5)
Eosinophils Relative: 3 %
HCT: 39.8 % (ref 36.0–46.0)
Hemoglobin: 12.7 g/dL (ref 12.0–15.0)
Immature Granulocytes: 0 %
Lymphocytes Relative: 25 %
Lymphs Abs: 2.2 K/uL (ref 0.7–4.0)
MCH: 27.3 pg (ref 26.0–34.0)
MCHC: 31.9 g/dL (ref 30.0–36.0)
MCV: 85.4 fL (ref 80.0–100.0)
Monocytes Absolute: 0.5 K/uL (ref 0.1–1.0)
Monocytes Relative: 5 %
Neutro Abs: 6 K/uL (ref 1.7–7.7)
Neutrophils Relative %: 67 %
Platelets: 388 K/uL (ref 150–400)
RBC: 4.66 MIL/uL (ref 3.87–5.11)
RDW: 14.7 % (ref 11.5–15.5)
WBC: 9.1 K/uL (ref 4.0–10.5)
nRBC: 0 % (ref 0.0–0.2)

## 2024-10-09 LAB — PHOSPHORUS: Phosphorus: 4 mg/dL (ref 2.5–4.6)

## 2024-10-09 LAB — MAGNESIUM: Magnesium: 2.2 mg/dL (ref 1.7–2.4)

## 2024-10-09 MED ORDER — GABAPENTIN 400 MG PO CAPS
400.0000 mg | ORAL_CAPSULE | Freq: Three times a day (TID) | ORAL | Status: DC
  Administered 2024-10-09 – 2024-10-10 (×4): 400 mg via ORAL
  Filled 2024-10-09 (×3): qty 1

## 2024-10-09 MED ORDER — TECHNETIUM TC 99M MEBROFENIN IV KIT
5.0000 | PACK | Freq: Once | INTRAVENOUS | Status: AC | PRN
  Administered 2024-10-09: 09:00:00 5.49 via INTRAVENOUS

## 2024-10-09 MED ORDER — DULOXETINE HCL 30 MG PO CPEP
30.0000 mg | ORAL_CAPSULE | Freq: Every day | ORAL | Status: DC
  Administered 2024-10-09 – 2024-10-12 (×4): 30 mg via ORAL
  Filled 2024-10-09 (×4): qty 1

## 2024-10-09 MED ORDER — CYCLOBENZAPRINE HCL 10 MG PO TABS
5.0000 mg | ORAL_TABLET | Freq: Three times a day (TID) | ORAL | Status: DC | PRN
  Administered 2024-10-09 – 2024-10-12 (×5): 5 mg via ORAL
  Filled 2024-10-09 (×5): qty 1

## 2024-10-09 NOTE — Progress Notes (Signed)
 PT Cancellation Note  Patient Details Name: Rumaisa Schnetzer MRN: [id number] DOB: Dec 23, 1970   Cancelled Treatment:    Reason Eval/Treat Not Completed: Patient at procedure or test/unavailable (Pt off floor in Nuclear Medicine. Will follow-up for PT treament as schedule permits.)  Randall SAUNDERS, PT, DPT Acute Rehabilitation Services Office: [phone] Secure Chat Preferred  Delon CHRISTELLA Callander 10/09/2024, 8:52 AM

## 2024-10-09 NOTE — Progress Notes (Signed)
 PT Cancellation Note  Patient Details Name: Ashlee Gordon MRN: [id number] DOB: [birth year]   Cancelled Treatment:    Reason Eval/Treat Not Completed: Pain limiting ability to participate;Patient declined, no reason specified (Pt greeted supine in bed, introduced self and goal of PT treatment. Coordinated with RN so pt was premedicated (Gabapentin  at 1203 and Oxycodon at 1226) prior to session. Pt declined any mobility or exercises in bed reporting 7/10 pain in her back and right hip. She denied abdominal pain, but didn't want to move and potentially worsen things. Educated pt that she was in a good pain window where the medication would be working, but she continued to decline mobilizing. Encouraged ankle pumps, quad sets, and glute sets. RN notified.) Will follow-up for PT treatment as schedule permits.   Randall SAUNDERS, PT, DPT Acute Rehabilitation Services Office: [phone] Secure Chat Preferred  Delon CHRISTELLA Callander 10/09/2024, 1:46 PM

## 2024-10-09 NOTE — Plan of Care (Signed)
   Problem: Coping: Goal: Ability to adjust to condition or change in health will improve Outcome: Progressing

## 2024-10-09 NOTE — Progress Notes (Signed)
 History  Patient is a 53 year old female with significant past medical history of sleep apnea, hypertension, obesity, prediabetes, depression, fibromyalgia, anemia, and osteoarthritis who [facility] ED after a sudden onset of a severe headache approximately around 0 530 on 12/5. Per report, severe headache occurred while her sister and boyfriend were arguing at the residence. Patient was immediately transported to CT scan-for stat head showing concerns of a acute subarachnoid hemorrhage, aneurysmal rupture, mild ventriculomegaly, no intraventricular hemorrhage and no midline shift. 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  Neurovascular was consulted and requested patient be transferred to [facility], ED for further neuro eval and and possible IR intervention. Patient was started on Cleviprex  due to hypertension. Upon arrival, patient was nauseous and also drowsy but not having no focal neuro deficits. PCCM was consulted for admission to 4N ICU after endovascular intervention- aneurysm coiling.   12/08 CT head revealed mild improvement of SAH primarily within the interhemispheric fissure. No convincing evidence of cerebral infarction.    12/15 Pt transfer out of the ICU. NIH Q4 hours   12/16 TRH assumed care.    Assessment/Plan: The patient demonstrates appropriate postoperative SAH recovery with an intact neurological exam and no new deficits. TRH has assumed care of the patient at this time. Workup is being conducted for potential Cholecystitis due to RUQ pain. [facility] has been contacted   No new neurosurgical plan at this time   Subjective: Patient reports drastically improved headache pain. She barely notices it now. However, she does report pain in her generalized stomach (esp the RUQ), legs, arms, and back of in which TRH are following.    Objective: Vital signs in last 24 hours: Temp:  [98.1 F (36.7 C)-98.8 F (37.1 C)] 98.8 F (37.1 C) (12/17 0305) Pulse Rate:  [88-91] 91 (12/17 0305) Resp:  [17-18] 18 (12/17 0305) BP: (109-147)/(57-87) 147/86 (12/17 0305) SpO2:  [93 %-98 %] 98 % (12/17 0305)  Intake/Output from previous day: No intake/output data recorded. Intake/Output this shift: No intake/output data recorded.  PHYSICAL EXAM  HENT:     Head: Normocephalic.     Nose: Nose normal.  Eyes:     Pupils: Pupils are equal, round, and reactive to light.  Cardiovascular:     Rate and Rhythm: Normal rate.  Pulmonary:     Effort: Pulmonary effort is normal.  Abdominal:     General: Abdomen is flat.  Pain to light palpation in the RUQ Musculoskeletal:     Cervical back: Normal range of motion.  Neurological:     Mental Status: Patient is alert and oriented.        Cranial Nerves: Cranial nerves 2-12 are intact.     Sensory: Sensation is intact.     Motor: Motor function is intact.    Coordination: Coordination is intact.  Skin:     Skin is intact    Lab Results: Recent Labs    10/08/24 0746 10/09/24 0253  WBC 9.6 9.1  HGB 11.8* 12.7  HCT 36.9 39.8  PLT 367 388   BMET Recent Labs    10/08/24 0746 10/09/24 0253  NA 138 137  K 3.5 4.3  CL 102 102  CO2 28 21*  GLUCOSE 107* 103*  BUN 9 11  CREATININE 0.97 1.00  CALCIUM 8.6* 9.3    Studies/Results: CT ABDOMEN PELVIS W CONTRAST Result Date: 10/08/2024 CLINICAL DATA:  Abdominal pain. EXAM: CT ABDOMEN AND PELVIS WITH CONTRAST TECHNIQUE: Multidetector CT imaging of the abdomen and pelvis was performed using the standard protocol following bolus administration of intravenous contrast. RADIATION DOSE REDUCTION: This exam was performed according to the departmental dose-optimization program which includes automated exposure control, adjustment of the mA and/or kV according to patient size and/or use of iterative reconstruction technique. CONTRAST:   75mL OMNIPAQUE  IOHEXOL  350 MG/ML SOLN COMPARISON:  CT abdomen pelvis dated 08/09/2022. FINDINGS: Lower chest: The visualized lung bases are clear. No intra-abdominal free air or free fluid. Hepatobiliary: The liver is unremarkable. No biliary dilatation. Gallstone. No pericholecystic fluid or evidence of acute cholecystitis by CT. Pancreas: Unremarkable. No pancreatic ductal dilatation or surrounding inflammatory changes. Spleen: Normal in size without focal abnormality. Adrenals/Urinary Tract: The adrenal glands unremarkable. There is mild left hydronephrosis. The right kidney is unremarkable. The visualized ureters and urinary bladder appear unremarkable. Stomach/Bowel: Postsurgical changes of gastric sleeve. There is no bowel obstruction or active inflammation. The appendix is normal. Vascular/Lymphatic: The abdominal aorta and IVC unremarkable. No portal venous gas. There is no adenopathy. Reproductive: The uterus appears mildly enlarged. No suspicious adnexal masses. Other: None Musculoskeletal: Degenerative changes of the spine. No acute osseous pathology. IMPRESSION: 1. Mild left hydronephrosis. 2. Cholelithiasis. 3. No bowel obstruction. Normal appendix. 4. Enlarged uterus. Electronically Signed   By: Vanetta Chou M.D.   On: 10/08/2024 20:14   VAS US  TRANSCRANIAL DOPPLER Result Date: 10/08/2024  Transcranial Doppler Patient Name:  Ashlee Gordon  Date of Exam:   10/07/2024 Medical Rec #: [id number]      Accession #:    [id number] Date of Birth: 10/23/1971     Patient Gender: F Patient Age:   70 years Exam Location:  [facility] Procedure:      VAS US  TRANSCRANIAL DOPPLER Referring Phys: FONDA SHARPS --------------------------------------------------------------------------------  Indications: Subarachnoid hemorrhage. 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  Limitations: patient movement and talking Comparison Study: Previous exam was on  10/04/2024 Performing Technologist: Ezzie Potters RVT, RDMS  Examination Guidelines: A complete evaluation includes B-mode imaging, spectral Doppler, color Doppler, and power Doppler as needed of all accessible portions of each vessel. Bilateral testing is considered an integral part of a complete examination. Limited examinations for reoccurring indications may be performed as noted.  +----------+---------------+----------+-----------+------------------+ RIGHT TCD Right VM (cm/s)Depth (cm)Pulsatility     Comment       +----------+---------------+----------+-----------+------------------+ MCA             101         4.80      1.22                       +----------+---------------+----------+-----------+------------------+ ACA             -25                   1.25                       +----------+---------------+----------+-----------+------------------+ Term ICA        71                    1.14                       +----------+---------------+----------+-----------+------------------+ PCA P1  unable to insonate +----------+---------------+----------+-----------+------------------+ Opthalmic       16                    1.45                       +----------+---------------+----------+-----------+------------------+ ICA siphon      34                    1.21                       +----------+---------------+----------+-----------+------------------+ Vertebral       -30                   1.08                       +----------+---------------+----------+-----------+------------------+ Distal ICA      34                    1.07                       +----------+---------------+----------+-----------+------------------+  +----------+--------------+----------+-----------+-------+ LEFT TCD  Left VM (cm/s)Depth (cm)PulsatilityComment +----------+--------------+----------+-----------+-------+ MCA             93         5.40       1.08            +----------+--------------+----------+-----------+-------+ ACA            -16                   0.90            +----------+--------------+----------+-----------+-------+ Term ICA        68                   0.89            +----------+--------------+----------+-----------+-------+ PCA P1          29                   0.82            +----------+--------------+----------+-----------+-------+ Opthalmic       15                   1.65            +----------+--------------+----------+-----------+-------+ ICA siphon      30                   1.28            +----------+--------------+----------+-----------+-------+ Vertebral      -43                   0.95            +----------+--------------+----------+-----------+-------+ Distal ICA      26                   1.15            +----------+--------------+----------+-----------+-------+  +------------+-------+------------------+             VM cm/s     Comment       +------------+-------+------------------+ Prox Basilar       unable to insonate +------------+-------+------------------+ Dist Basilar       unable to insonate +------------+-------+------------------+ +----------------------+----+ Right Lindegaard Ratio2.97 +----------------------+----+ +---------------------+----+  Left Lindegaard Ratio3.58 +---------------------+----+  Summary:  Elevated right middle cerebral artery mean flow velocities suggest mild vasospasm and elevate dleft MCA mean flow velocities suggest borderline mild vasospasm. basilar artery not insonated due to technical difficuluties from suboptimal suboccipital window. *See table(s) above for TCD measurements and observations.  Diagnosing physician: Eather Popp MD Electronically signed by Eather Popp MD on 10/08/2024 at 11:26:48 AM.    Final    US  Abdomen Complete Result Date: 10/07/2024 EXAM: COMPLETE ABDOMINAL ULTRASOUND TECHNIQUE: Real-time ultrasonography  of the abdomen was performed. COMPARISON: CT abdomen and pelvis 08/09/2022. CLINICAL HISTORY: Abdominal pain. FINDINGS: LIVER: The liver demonstrates normal echogenicity. No intrahepatic biliary ductal dilatation. No mass. BILIARY SYSTEM: Cholelithiasis with a 2.8 cm stone in the gallbladder neck. No gallbladder wall thickening or edema. Murphy sign is positive. Common bile duct diameter measures 3 mm, which is normal. KIDNEYS: The right kidney measures 11 cm in length, and the left kidney measures 10.5 cm in length. Normal cortical echogenicity. No hydronephrosis. No calculus. No mass. PANCREAS: The pancreas is not well visualized due to body habitus, but it appears grossly unremarkable. SPLEEN: The spleen is not well visualized due to body habitus, but it appears grossly unremarkable. Spleen is within normal limits in size. VESSELS: The aorta is not well visualized due to body habitus, but it appears grossly unremarkable. Visualized portion of the inferior vena cava is normal. OTHER: No ascites. IMPRESSION: 1. Cholelithiasis with a 2.8 cm stone in the gallbladder neck and a positive sonographic Murphy sign, without gallbladder wall thickening or edema. Appearance suggests acute cholecystitis in the appropriate clinical setting. Electronically signed by: Elsie Gravely MD 10/07/2024 02:27 PM EST RP Workstation: [id number]     Jayson PARAS Codi Kertz 10/09/2024, 11:59 AM

## 2024-10-09 NOTE — Progress Notes (Signed)
 New event at 0730 CCMD called and reported patient had drop beats,  heart rate dropped to the 30's non sustaining,  a possible second degree heart block.

## 2024-10-09 NOTE — Progress Notes (Addendum)
 PROGRESS NOTE    Ashlee Gordon  FMW:[id number] DOB: [birth year] DOA: 09/27/2024 PCP: [facility]  Chief Complaint  Patient presents with   Headache   Emesis    Brief Narrative:    53 year old female with significant past medical history of  who [facility] ED after Countess Biebel sudden onset of Adreena Willits severe headache approximately around 0530 on 12/5 due to [facility].  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 Added gabapentin , Fioricet , PRN fentanyl . Also asking to try CPAP while in hospital. New SBP goal of < 200 per Dr. Lester 12/8 CT head with mild improvement in [facility] 12/12 persistent headache, hip and back pain -Lidoderm  patches, Xanax  added 12/15 - Transferred out of ICU. NIH liberalized to q 4 hours. 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  CT 12/6 with extensive SAH.   12/8 CT with mild interval improvement of SAH.  TCD 12/15 with elevated R middle cerebral artery mean flow velocities suggesting mild vasospasm and elveated L MCA mean flow velocities suggest borderline mild vasospasm.   12/15 neuro IR note, possible d/c day 12 if no changes - no new nsgy recs today  Hypertension Hydralazine , coreg , amlodipine , losartan , nimodipine  Goal < 160  Abdominal Pain C/o generalized pain RUQ US  shows possible cholecystitis Pending HIDA scan CT shows mild L hydro, not clear  significance, will discuss with urology Will c/s general surgery  Depression  Fibromyalgia Bupropion   Increase gabapentin  - uptitrate as tolerated Cymbalta  maybe good option to try, but this will take several weeks Trial flexeril    Prediabetes Noted  Obesity, Class III Body mass index is 68.94 kg/m.    DVT prophylaxis: heparin  Code Status: full Family Communication: none Disposition:   Status is: Inpatient Remains inpatient appropriate because: need for continued inpatient care   Consultants:  PCCM Neuro IR  Procedures:   Balloon assisted coil embolization of ruptured left internal carotid artery aneurysm from femoral access, placement of right radial Deshawn Skelley line with ultrasound guidance.  Antimicrobials:  Anti-infectives (From admission, onward)    None       Subjective: C/o pain everywhere  Objective: Vitals:   10/08/24 1136 10/08/24 1545 10/09/24 0022 10/09/24 0305  BP: (!) 155/97 (!) 109/57 136/87 (!) 147/86  Pulse: 94 91 88 91  Resp: 19 17  18   Temp: 99.3 F (37.4 C) 98.1 F (36.7 C) 98.2 F (36.8 C) 98.8 F (37.1 C)  TempSrc: Oral Oral Oral Oral  SpO2: 95% 93% 93% 98%  Weight:      Height:       No intake or output data in the 24 hours ending 10/09/24 1405  Filed Weights   09/27/24 1345 09/27/24 1630 09/28/24 0315  Weight: (!) 200 kg (!) 205.6 kg (!) 205.6 kg    Examination:  General: appears uncomfortable - c/o pain all over Cardiovascular: RRR Lungs: Clear to auscultation bilaterally with good air movement. No  rales, rhonchi or wheezes. Abdomen: pain out of proportion to abdominal exam Neurological: Alert and oriented 3. Moves all extremities 4 with equal strength. Cranial nerves II through XII grossly intact. Extremities: No clubbing or cyanosis. No edema.    Data Reviewed: I have personally reviewed following labs and imaging studies  CBC: Recent Labs  Lab 10/05/24 0645 10/06/24 0420 10/07/24 0529 10/08/24 0746  10/09/24 0253  WBC 12.4* 12.9* 10.7* 9.6 9.1  NEUTROABS  --   --   --   --  6.0  HGB 11.7* 12.0 12.0 11.8* 12.7  HCT 36.6 37.2 37.8 36.9 39.8  MCV 86.3 84.9 84.9 85.6 85.4  PLT 251 299 323 367 388    Basic Metabolic Panel: Recent Labs  Lab 10/03/24 0200 10/03/24 1921 10/04/24 0422 10/04/24 1856 10/05/24 0645 10/05/24 1904 10/06/24 0420 10/06/24 1805 10/07/24 0529 10/07/24 1918 10/08/24 0459 10/08/24 0746 10/09/24 0253  NA 138   < > 137   < > 136   < > 138   < > 139 137 139 138 137  K 3.6  --  3.6  --  3.6  --  3.4*  --  3.7  --   --  3.5 4.3  CL 105  --  102  --  103  --  100  --  104  --   --  102 102  CO2 27  --  25  --  26  --  25  --  26  --   --  28 21*  GLUCOSE 128*  --  132*  --  117*  --  116*  --  103*  --   --  107* 103*  BUN 12  --  10  --  7  --  8  --  10  --   --  9 11  CREATININE 1.02*  --  1.06*  --  0.97  --  1.15*  --  1.09*  --   --  0.97 1.00  CALCIUM 7.9*  --  8.1*  --  8.3*  --  8.5*  --  8.5*  --   --  8.6* 9.3  MG 2.0  --  2.1  --   --   --   --   --  2.1  --   --   --  2.2  PHOS 4.0  --  3.2  --   --   --   --   --  3.6  --   --   --  4.0   < > = values in this interval not displayed.    GFR: Estimated Creatinine Clearance: 125.3 mL/min (by C-G formula based on SCr of 1 mg/dL).  Liver Function Tests: Recent Labs  Lab 10/08/24 2142 10/09/24 0253  AST 32 33  ALT 34 33  ALKPHOS 200* 187*  BILITOT 0.5 0.5  PROT 8.0 7.6  ALBUMIN 3.4* 3.3*    CBG: No results for input(s): GLUCAP in the last 168 hours.    No results found for this or any previous visit (from the past 240 hours).       Radiology Studies: CT ABDOMEN PELVIS W CONTRAST Result Date: 10/08/2024 CLINICAL DATA:  Abdominal pain. EXAM: CT ABDOMEN AND PELVIS WITH CONTRAST TECHNIQUE: Multidetector CT imaging of the abdomen and pelvis was performed using the standard protocol following bolus administration of intravenous contrast. RADIATION DOSE REDUCTION: This exam was  performed according to the departmental dose-optimization program which includes automated exposure  control, adjustment of the mA and/or kV according to patient size and/or use of iterative reconstruction technique. CONTRAST:  75mL OMNIPAQUE  IOHEXOL  350 MG/ML SOLN COMPARISON:  CT abdomen pelvis dated 08/09/2022. FINDINGS: Lower chest: The visualized lung bases are clear. No intra-abdominal free air or free fluid. Hepatobiliary: The liver is unremarkable. No biliary dilatation. Gallstone. No pericholecystic fluid or evidence of acute cholecystitis by CT. Pancreas: Unremarkable. No pancreatic ductal dilatation or surrounding inflammatory changes. Spleen: Normal in size without focal abnormality. Adrenals/Urinary Tract: The adrenal glands unremarkable. There is mild left hydronephrosis. The right kidney is unremarkable. The visualized ureters and urinary bladder appear unremarkable. Stomach/Bowel: Postsurgical changes of gastric sleeve. There is no bowel obstruction or active inflammation. The appendix is normal. Vascular/Lymphatic: The abdominal aorta and IVC unremarkable. No portal venous gas. There is no adenopathy. Reproductive: The uterus appears mildly enlarged. No suspicious adnexal masses. Other: None Musculoskeletal: Degenerative changes of the spine. No acute osseous pathology. IMPRESSION: 1. Mild left hydronephrosis. 2. Cholelithiasis. 3. No bowel obstruction. Normal appendix. 4. Enlarged uterus. Electronically Signed   By: Vanetta Chou M.D.   On: 10/08/2024 20:14   US  Abdomen Complete Result Date: 10/07/2024 EXAM: COMPLETE ABDOMINAL ULTRASOUND TECHNIQUE: Real-time ultrasonography of the abdomen was performed. COMPARISON: CT abdomen and pelvis 08/09/2022. CLINICAL HISTORY: Abdominal pain. FINDINGS: LIVER: The liver demonstrates normal echogenicity. No intrahepatic biliary ductal dilatation. No mass. BILIARY SYSTEM: Cholelithiasis with Shenice Dolder 2.8 cm stone in the gallbladder neck. No gallbladder wall  thickening or edema. Murphy sign is positive. Common bile duct diameter measures 3 mm, which is normal. KIDNEYS: The right kidney measures 11 cm in length, and the left kidney measures 10.5 cm in length. Normal cortical echogenicity. No hydronephrosis. No calculus. No mass. PANCREAS: The pancreas is not well visualized due to body habitus, but it appears grossly unremarkable. SPLEEN: The spleen is not well visualized due to body habitus, but it appears grossly unremarkable. Spleen is within normal limits in size. VESSELS: The aorta is not well visualized due to body habitus, but it appears grossly unremarkable. Visualized portion of the inferior vena cava is normal. OTHER: No ascites. IMPRESSION: 1. Cholelithiasis with Jerlyn Pain 2.8 cm stone in the gallbladder neck and Diago Haik positive sonographic Murphy sign, without gallbladder wall thickening or edema. Appearance suggests acute cholecystitis in the appropriate clinical setting. Electronically signed by: Elsie Gravely MD 10/07/2024 02:27 PM EST RP Workstation: [id number]        Scheduled Meds:  amLODipine   10 mg Oral Daily   aspirin   81 mg Oral Daily   buPROPion   150 mg Oral Daily   carvedilol   25 mg Oral BID WC   Chlorhexidine  Gluconate Cloth  6 each Topical Daily   DULoxetine   30 mg Oral Daily   feeding supplement (GLUCERNA SHAKE)  237 mL Oral TID BM   gabapentin   300 mg Oral TID   heparin  injection (subcutaneous)  5,000 Units Subcutaneous Q8H   hydrALAZINE   100 mg Oral Q8H   lidocaine   2 patch Transdermal Daily   losartan   50 mg Oral Daily   multivitamin with minerals  1 tablet Oral Daily   niMODipine   60 mg Oral Q4H   Or   niMODipine   60 mg Per Tube Q4H   nystatin    Topical BID   pantoprazole   40 mg Oral Daily   polyethylene glycol  17 g Oral Daily   senna-docusate  1 tablet Oral BID   Continuous Infusions:  promethazine  (PHENERGAN ) injection (IM or IVPB)  LOS: 12 days    Time spent: over 30 min    Meliton Monte, MD [facility]   To contact the attending provider between 7A-7P or the covering provider during after hours 7P-7A, please log into the web site [email or web address] and access using universal [place] password for that web site. If you do not have the password, please call the hospital operator.  10/09/2024, 2:05 PM

## 2024-10-09 NOTE — Progress Notes (Signed)
° °  Brief Progress Note   _____________________________________________________________________________________________________________  Patient Name: Ashlee Gordon Patient DOB: Aug 08, 1971 Date: @TODAY @   Pt is going home with outpatient Pt at [facility] and OR referral sent to [facility].  Bariatric walker and BSC thru [employer] witll be delivered to room  _____________________________________________________________________________________________________________  [facility] RN Expeditor Ronal DELENA Bald Please contact us  directly via secure chat (search for [facility]) or by calling us  at [phone] [facility]).

## 2024-10-09 NOTE — Progress Notes (Signed)
 [facility] Progress Note  12 Days Post-Op  Subjective: CC:  Complaining of pain all over. Asking to drink something.  Afebrile, hemodynamically stable WBC WNL Objective: Vital signs in last 24 hours: Temp:  [98.1 F (36.7 C)-99.3 F (37.4 C)] 98.8 F (37.1 C) (12/17 0305) Pulse Rate:  [88-94] 91 (12/17 0305) Resp:  [17-19] 18 (12/17 0305) BP: (109-155)/(57-97) 147/86 (12/17 0305) SpO2:  [93 %-98 %] 98 % (12/17 0305) Last BM Date : 10/05/24  Intake/Output from previous day: No intake/output data recorded. Intake/Output this shift: No intake/output data recorded.  PE: Gen:  Alert, NAD, cooperative Card:  Regular rate and rhythm Pulm:  Normal effort Abd: Soft, obese, nontender in RUQ  Skin: warm and dry Psych: A&Ox3   Lab Results:  Recent Labs    10/08/24 0746 10/09/24 0253  WBC 9.6 9.1  HGB 11.8* 12.7  HCT 36.9 39.8  PLT 367 388   BMET Recent Labs    10/08/24 0746 10/09/24 0253  NA 138 137  K 3.5 4.3  CL 102 102  CO2 28 21*  GLUCOSE 107* 103*  BUN 9 11  CREATININE 0.97 1.00  CALCIUM 8.6* 9.3   PT/INR No results for input(s): LABPROT, INR in the last 72 hours. CMP     Component Value Date/Time   NA 137 10/09/2024 0253   NA 146 07/31/2024 0000   K 4.3 10/09/2024 0253   CL 102 10/09/2024 0253   CO2 21 (L) 10/09/2024 0253   GLUCOSE 103 (H) 10/09/2024 0253   BUN 11 10/09/2024 0253   BUN 15 07/31/2024 0000   CREATININE 1.00 10/09/2024 0253   CALCIUM 9.3 10/09/2024 0253   PROT 7.6 10/09/2024 0253   PROT 7.5 11/22/2022 1016   ALBUMIN 3.3 (L) 10/09/2024 0253   ALBUMIN 4.0 11/22/2022 1016   AST 33 10/09/2024 0253   ALT 33 10/09/2024 0253   ALKPHOS 187 (H) 10/09/2024 0253   BILITOT 0.5 10/09/2024 0253   BILITOT 0.2 11/22/2022 1016   GFRNONAA >60 10/09/2024 0253   Lipase  No results found for: LIPASE     Studies/Results: CT ABDOMEN PELVIS W CONTRAST Result Date: 10/08/2024 CLINICAL DATA:  Abdominal pain. EXAM: CT  ABDOMEN AND PELVIS WITH CONTRAST TECHNIQUE: Multidetector CT imaging of the abdomen and pelvis was performed using the standard protocol following bolus administration of intravenous contrast. RADIATION DOSE REDUCTION: This exam was performed according to the departmental dose-optimization program which includes automated exposure control, adjustment of the mA and/or kV according to patient size and/or use of iterative reconstruction technique. CONTRAST:  75mL OMNIPAQUE  IOHEXOL  350 MG/ML SOLN COMPARISON:  CT abdomen pelvis dated 08/09/2022. FINDINGS: Lower chest: The visualized lung bases are clear. No intra-abdominal free air or free fluid. Hepatobiliary: The liver is unremarkable. No biliary dilatation. Gallstone. No pericholecystic fluid or evidence of acute cholecystitis by CT. Pancreas: Unremarkable. No pancreatic ductal dilatation or surrounding inflammatory changes. Spleen: Normal in size without focal abnormality. Adrenals/Urinary Tract: The adrenal glands unremarkable. There is mild left hydronephrosis. The right kidney is unremarkable. The visualized ureters and urinary bladder appear unremarkable. Stomach/Bowel: Postsurgical changes of gastric sleeve. There is no bowel obstruction or active inflammation. The appendix is normal. Vascular/Lymphatic: The abdominal aorta and IVC unremarkable. No portal venous gas. There is no adenopathy. Reproductive: The uterus appears mildly enlarged. No suspicious adnexal masses. Other: None Musculoskeletal: Degenerative changes of the spine. No acute osseous pathology. IMPRESSION: 1. Mild left hydronephrosis. 2. Cholelithiasis. 3. No bowel obstruction. Normal appendix. 4. Enlarged uterus.  Electronically Signed   By: Vanetta Chou M.D.   On: 10/08/2024 20:14   VAS US  TRANSCRANIAL DOPPLER Result Date: 10/08/2024  Transcranial Doppler Patient Name:  ORLANDA FRANKUM  Date of Exam:   10/07/2024 Medical Rec #: [id number]      Accession #:    [id number] Date of Birth:  07-19-1971     Patient Gender: F Patient Age:   53 years Exam Location:  [facility] Procedure:      VAS US  TRANSCRANIAL DOPPLER Referring Phys: FONDA SHARPS --------------------------------------------------------------------------------  Indications: Subarachnoid hemorrhage. 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  Limitations: patient movement and talking Comparison Study: Previous exam was on 10/04/2024 Performing Technologist: Ezzie Potters RVT, RDMS  Examination Guidelines: A complete evaluation includes B-mode imaging, spectral Doppler, color Doppler, and power Doppler as needed of all accessible portions of each vessel. Bilateral testing is considered an integral part of a complete examination. Limited examinations for reoccurring indications may be performed as noted.  +----------+---------------+----------+-----------+------------------+ RIGHT TCD Right VM (cm/s)Depth (cm)Pulsatility     Comment       +----------+---------------+----------+-----------+------------------+ MCA             101         4.80      1.22                       +----------+---------------+----------+-----------+------------------+ ACA             -25                   1.25                       +----------+---------------+----------+-----------+------------------+ Term ICA        71                    1.14                       +----------+---------------+----------+-----------+------------------+ PCA P1                                        unable to insonate +----------+---------------+----------+-----------+------------------+ Opthalmic       16                    1.45                       +----------+---------------+----------+-----------+------------------+ ICA siphon      34                    1.21                       +----------+---------------+----------+-----------+------------------+ Vertebral       -30                    1.08                       +----------+---------------+----------+-----------+------------------+ Distal ICA      34                    1.07                       +----------+---------------+----------+-----------+------------------+  +----------+--------------+----------+-----------+-------+  LEFT TCD  Left VM (cm/s)Depth (cm)PulsatilityComment +----------+--------------+----------+-----------+-------+ MCA             93         5.40      1.08            +----------+--------------+----------+-----------+-------+ ACA            -16                   0.90            +----------+--------------+----------+-----------+-------+ Term ICA        68                   0.89            +----------+--------------+----------+-----------+-------+ PCA P1          29                   0.82            +----------+--------------+----------+-----------+-------+ Opthalmic       15                   1.65            +----------+--------------+----------+-----------+-------+ ICA siphon      30                   1.28            +----------+--------------+----------+-----------+-------+ Vertebral      -43                   0.95            +----------+--------------+----------+-----------+-------+ Distal ICA      26                   1.15            +----------+--------------+----------+-----------+-------+  +------------+-------+------------------+             VM cm/s     Comment       +------------+-------+------------------+ Prox Basilar       unable to insonate +------------+-------+------------------+ Dist Basilar       unable to insonate +------------+-------+------------------+ +----------------------+----+ Right Lindegaard Ratio2.97 +----------------------+----+ +---------------------+----+ Left Lindegaard Ratio3.58 +---------------------+----+  Summary:  Elevated right middle cerebral artery mean flow velocities suggest mild vasospasm and elevate  dleft MCA mean flow velocities suggest borderline mild vasospasm. basilar artery not insonated due to technical difficuluties from suboptimal suboccipital window. *See table(s) above for TCD measurements and observations.  Diagnosing physician: Eather Popp MD Electronically signed by Eather Popp MD on 10/08/2024 at 11:26:48 AM.    Final    US  Abdomen Complete Result Date: 10/07/2024 EXAM: COMPLETE ABDOMINAL ULTRASOUND TECHNIQUE: Real-time ultrasonography of the abdomen was performed. COMPARISON: CT abdomen and pelvis 08/09/2022. CLINICAL HISTORY: Abdominal pain. FINDINGS: LIVER: The liver demonstrates normal echogenicity. No intrahepatic biliary ductal dilatation. No mass. BILIARY SYSTEM: Cholelithiasis with a 2.8 cm stone in the gallbladder neck. No gallbladder wall thickening or edema. Murphy sign is positive. Common bile duct diameter measures 3 mm, which is normal. KIDNEYS: The right kidney measures 11 cm in length, and the left kidney measures 10.5 cm in length. Normal cortical echogenicity. No hydronephrosis. No calculus. No mass. PANCREAS: The pancreas is not well visualized due to body habitus, but it appears grossly unremarkable. SPLEEN: The spleen is not well visualized due to body habitus, but it appears grossly unremarkable. Spleen is within  normal limits in size. VESSELS: The aorta is not well visualized due to body habitus, but it appears grossly unremarkable. Visualized portion of the inferior vena cava is normal. OTHER: No ascites. IMPRESSION: 1. Cholelithiasis with a 2.8 cm stone in the gallbladder neck and a positive sonographic Murphy sign, without gallbladder wall thickening or edema. Appearance suggests acute cholecystitis in the appropriate clinical setting. Electronically signed by: Elsie Gravely MD 10/07/2024 02:27 PM EST RP Workstation: [id number]    Anti-infectives: Anti-infectives (From admission, onward)    None        Assessment/Plan  Abdominal pain Possible acute  cholecystitis  - RUQ US  with 2.8 cm stone in the gallbladder neck with positive Murphy sign, no gallbladder wall thickening or edema - No leukocytosis currently, afebrile and HD stable. - LFTs overall unremarkable as above, mildly elevated alk phos which is not typical for cholecystitis  - On exam, patient is tender across entire upper abdomen, improved today compared to yesterday - HIDA scan this AM, just spoke to nuc med and they are on the way to get her and take her for the study - Will continue to follow.    FEN: NPO  VTE: Heparin  injections ID: None recommended at this time.    - per TRH -  Recent SAH due to L ICA aneurysm HTN Prediabetes Depression Fibromyalgia Morbid Obesity    LOS: 12 days   I reviewed nursing notes, hospitalist notes, last 24 h vitals and pain scores, last 48 h intake and output, last 24 h labs and trends, and last 24 h imaging results.  This care required moderate level of medical decision making.   Almarie Pringle, PA-C [facility] Please see Amion for pager number during day hours 7:00am-4:30pm

## 2024-10-10 DIAGNOSIS — I609 Nontraumatic subarachnoid hemorrhage, unspecified: Secondary | ICD-10-CM | POA: Diagnosis not present

## 2024-10-10 LAB — COMPREHENSIVE METABOLIC PANEL WITH GFR
ALT: 52 U/L — ABNORMAL HIGH (ref 0–44)
AST: 43 U/L — ABNORMAL HIGH (ref 15–41)
Albumin: 3.9 g/dL (ref 3.5–5.0)
Alkaline Phosphatase: 216 U/L — ABNORMAL HIGH (ref 38–126)
Anion gap: 13 (ref 5–15)
BUN: 17 mg/dL (ref 6–20)
CO2: 23 mmol/L (ref 22–32)
Calcium: 9.9 mg/dL (ref 8.9–10.3)
Chloride: 101 mmol/L (ref 98–111)
Creatinine, Ser: 1.33 mg/dL — ABNORMAL HIGH (ref 0.44–1.00)
GFR, Estimated: 48 mL/min — ABNORMAL LOW (ref >60)
Glucose, Bld: 126 mg/dL — ABNORMAL HIGH (ref 70–99)
Potassium: 4.3 mmol/L (ref 3.5–5.1)
Sodium: 137 mmol/L (ref 135–145)
Total Bilirubin: 0.4 mg/dL (ref 0.0–1.2)
Total Protein: 8.3 g/dL — ABNORMAL HIGH (ref 6.5–8.1)

## 2024-10-10 LAB — CBC WITH DIFFERENTIAL/PLATELET
Abs Immature Granulocytes: 0.05 K/uL (ref 0.00–0.07)
Basophils Absolute: 0.1 K/uL (ref 0.0–0.1)
Basophils Relative: 1 %
Eosinophils Absolute: 0.1 K/uL (ref 0.0–0.5)
Eosinophils Relative: 1 %
HCT: 40.1 % (ref 36.0–46.0)
Hemoglobin: 12.8 g/dL (ref 12.0–15.0)
Immature Granulocytes: 1 %
Lymphocytes Relative: 25 %
Lymphs Abs: 2.4 K/uL (ref 0.7–4.0)
MCH: 27.5 pg (ref 26.0–34.0)
MCHC: 31.9 g/dL (ref 30.0–36.0)
MCV: 86.2 fL (ref 80.0–100.0)
Monocytes Absolute: 0.5 K/uL (ref 0.1–1.0)
Monocytes Relative: 5 %
Neutro Abs: 6.8 K/uL (ref 1.7–7.7)
Neutrophils Relative %: 67 %
Platelets: 480 K/uL — ABNORMAL HIGH (ref 150–400)
RBC: 4.65 MIL/uL (ref 3.87–5.11)
RDW: 14.7 % (ref 11.5–15.5)
WBC: 9.9 K/uL (ref 4.0–10.5)
nRBC: 0 % (ref 0.0–0.2)

## 2024-10-10 LAB — PHOSPHORUS: Phosphorus: 4.4 mg/dL (ref 2.5–4.6)

## 2024-10-10 LAB — MAGNESIUM: Magnesium: 2.4 mg/dL (ref 1.7–2.4)

## 2024-10-10 MED ORDER — PROCHLORPERAZINE EDISYLATE 10 MG/2ML IJ SOLN
10.0000 mg | Freq: Once | INTRAMUSCULAR | Status: AC
  Administered 2024-10-10: 17:00:00 10 mg via INTRAVENOUS
  Filled 2024-10-10: qty 2

## 2024-10-10 MED ORDER — DIPHENHYDRAMINE HCL 25 MG PO CAPS
25.0000 mg | ORAL_CAPSULE | Freq: Once | ORAL | Status: AC
  Administered 2024-10-10: 17:00:00 25 mg via ORAL
  Filled 2024-10-10: qty 1

## 2024-10-10 MED ORDER — LACTATED RINGERS IV BOLUS
250.0000 mL | Freq: Once | INTRAVENOUS | Status: AC
  Administered 2024-10-10: 17:00:00 250 mL via INTRAVENOUS

## 2024-10-10 MED ORDER — CARVEDILOL 12.5 MG PO TABS
12.5000 mg | ORAL_TABLET | Freq: Two times a day (BID) | ORAL | Status: DC
  Administered 2024-10-10 – 2024-10-12 (×4): 12.5 mg via ORAL
  Filled 2024-10-10 (×4): qty 1

## 2024-10-10 MED ORDER — GABAPENTIN 300 MG PO CAPS
600.0000 mg | ORAL_CAPSULE | Freq: Three times a day (TID) | ORAL | Status: DC
  Administered 2024-10-10 – 2024-10-12 (×6): 600 mg via ORAL
  Filled 2024-10-10 (×6): qty 2

## 2024-10-10 NOTE — Progress Notes (Signed)
 Nutrition Follow-up  DOCUMENTATION CODES:  Morbid obesity  INTERVENTION:  Continue current diet as ordered Encourage PO intake MVI with minerals daily Discontinue glucerna as pt is refusing   NUTRITION DIAGNOSIS:  Increased nutrient needs related to acute illness (subarachnoid hemorrhage) as evidenced by estimated needs. - remains applicable  GOAL:  Patient will meet greater than or equal to 90% of their needs - progressing  MONITOR:  PO intake, Supplement acceptance, Labs, Weight trends  REASON FOR ASSESSMENT:  Consult Diet education, Assessment of nutrition requirement/status  ASSESSMENT:  Patient presented with severe headache and was found to have subarachnoid hemorrhage 2/2 L ICA aneurysm s/p coiling 12/5. PMH significant for prediabetes, HTN, Vit D deficiency, fibromyalgia, osteoarthritis, and sleep apnea.  12/5 - presented to ED, coiling with balloon assistance for aneurysm, admitted to ICU, left intubated after procedure 12/6 - extubated 12/8 - SLP BSE, regular diet 12/15 - Transferred to 3W 12/16 - HIDA scan  Pt working with PT/OT at the time of assessment. Reviewed intake since last assessment and does appear to be improving. TOC working on getting services set up for outpatient therapies. Will continue to monitor intake and add supplements as appropriate.   Admit weight: 200 kg  Current weight: 205.6 kg    Average Meal Intake: 12/9-12/15: 68% intake x 7 recorded meals  Nutritionally Relevant Medications: Scheduled Meds:  GLUCERNA SHAKE  237 mL Oral TID BM   multivitamin with minerals  1 tablet Oral Daily   niMODipine   60 mg Oral Q4H   pantoprazole   40 mg Oral Daily   polyethylene glycol  17 g Oral Daily   senna-docusate  1 tablet Oral BID   Continuous Infusions:  promethazine  (PHENERGAN ) injection (IM or IVPB)     PRN Meds: ondansetron , promethazine   Labs Reviewed: Creatinine 1.33 HgbA1c 6.1%  NUTRITION - FOCUSED PHYSICAL EXAM: Flowsheet Row  Most Recent Value  Orbital Region No depletion  Upper Arm Region No depletion  Thoracic and Lumbar Region No depletion  Buccal Region No depletion  Temple Region No depletion  Clavicle Bone Region No depletion  Clavicle and Acromion Bone Region Unable to assess  [due to body habitus]  Scapular Bone Region Unable to assess  [due to body habitus]  Dorsal Hand No depletion  Patellar Region Unable to assess  [due to body habitus]  Anterior Thigh Region Unable to assess  [due to body habitus]  Posterior Calf Region Unable to assess  [due to body habitus]  Edema (RD Assessment) Mild  [non-pitting BLE and generalized]  Hair Reviewed  Eyes Reviewed  Mouth Reviewed  Skin Reviewed  Nails Reviewed    Diet Order:   Diet Order             Diet Heart Room service appropriate? Yes; Fluid consistency: Thin  Diet effective now                   EDUCATION NEEDS:  Education needs have been addressed  Skin:  Skin Assessment: Reviewed RN Assessment  Last BM:  12/18 - type 6  Height:  Ht Readings from Last 1 Encounters:  09/27/24 5' 7.99 (1.727 m)    Weight:  Wt Readings from Last 1 Encounters:  09/28/24 (!) 205.6 kg    Ideal Body Weight:  64 kg  BMI:  Body mass index is 68.94 kg/m.  Estimated Nutritional Needs:  Kcal:  1800-2000 kcal/d Protein:  100-115 g/d Fluid:  2L/d    Ashlee Gordon, RD, LDN, CNSC Registered Dietitian II  Please reach out via secure chat

## 2024-10-10 NOTE — Progress Notes (Signed)
 Pt is alert and fully oriented x 4. NSR on the monitor, stable neuro signs, stable hemodynamically, afebrile, on room air, normal respiratory effort. She refuses CPAP tonight. She has complaints of generalized pain and headache. Oxycodone  PRN was give. She is able to rest and sleep well after pain med given. No acute distress noted. She is ambulates well to the bathroom and has a shower this am. Plan of care is reviewed. Pt has been progressing. We will continue to monitor.   10/10/24 0302  Mobility (See group info for [facility] equipment and weight capacities recommendations)  HOB Elevated/Bed Position Self regulated  Activity Ambulated with assistance  Range of Motion/Exercises Active Assistive;All extremities  Level of Assistance Contact guard assist, steadying assist  Assistive Device Front wheel walker  Distance Ambulated (ft) 30 ft  Activity Response Tolerated well  Transport method Ambulatory    Wendi Dash, RN

## 2024-10-10 NOTE — Progress Notes (Addendum)
 HIDA scan negative for cholecystitis. Symptoms inconsistent with symptomatic cholelithiasis (occasional LUQ and upper abdominal pain, not exacerbated by PO intake, not associated with vomiting). CT A/P 12/16 negative for other acute intra-abdominal pathology.   CCS will sign off, call as needed.  Almarie Pringle, PA-C [facility] Please see Amion for pager number during day hours 7:00am-4:30pm

## 2024-10-10 NOTE — Progress Notes (Signed)
 PROGRESS NOTE    Ashlee Gordon  FMW:[id number] DOB: 1971-04-22 DOA: 09/27/2024 PCP: [facility]  Chief Complaint  Patient presents with   Headache   Emesis    Brief Narrative:    53 year old female with significant past medical history of  who presented to [facility] ED after Ashlee Gordon sudden onset of Ashlee Gordon severe headache approximately around 0530 on 12/5 due to [facility].  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 Added gabapentin , Fioricet , PRN fentanyl . Also asking to try CPAP while in hospital. New SBP goal of < 200 per Dr. Lester 12/8 CT head with mild improvement in [facility] 12/12 persistent headache, hip and back pain -Lidoderm  patches, Xanax  added 12/15 - Transferred out of ICU. NIH liberalized to q 4 hours. 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  CT 12/6 with extensive SAH.   12/8 CT with mild interval improvement of SAH.  TCD 12/15 with elevated R middle cerebral artery mean flow velocities suggesting mild vasospasm and elveated L MCA mean flow velocities suggest borderline mild vasospasm.   12/15 neuro IR note, possible d/c day 12 if no changes - no new nsgy recs today  Hypertension Hydralazine , coreg  (dose decreased), amlodipine , losartan , nimodipine  Goal < 160  Bradycardia  Decrease coreg , add holding parameters  Headache Trial HA cocktail   Abdominal Pain C/o  generalized pain RUQ US  shows possible cholecystitis HIDA scan without evidence cholecystitis  CT shows mild L hydro, not clear significance, discussed with urology, low suspicion clinically significant Will c/s general surgery  Depression  Fibromyalgia Chronic Pain Bupropion   Increase gabapentin  - uptitrate as tolerated Cymbalta  maybe good option to try, but this will take several weeks Trial flexeril   Suspect her generalized pain is chronic, we discussed we're unlikely to completely control her pain in the hospital  Bilateral LE Pain Suspect this represents sciatica based on her description Will uptitrate gabapentin    Prediabetes Noted  Obesity, Class III Body mass index is 64.91 kg/m.    DVT prophylaxis: heparin  Code Status: full Family Communication: none Disposition:   Status is: Inpatient Remains inpatient appropriate because: need for continued inpatient care   Consultants:  PCCM Neuro IR  Procedures:   Balloon assisted coil embolization of ruptured left internal carotid artery aneurysm from femoral access, placement of right radial Ashlee Gordon line with ultrasound guidance.  Antimicrobials:  Anti-infectives (From admission, onward)    None       Subjective: C/o frontal HA Also pain that sounds like sciatica  Objective: Vitals:   10/10/24 0733 10/10/24 1125 10/10/24 1203 10/10/24 1528  BP: 125/60 133/86  125/66  Pulse: 78 70  75  Resp: 16 18  16   Temp: 97.9 F (36.6 C) 98.6 F (37 C)  98.9 F (37.2 C)  TempSrc: Oral   Oral  SpO2: 96% 99%  99%  Weight:   (!) 193.6 kg   Height:  Intake/Output Summary (Last 24 hours) at 10/10/2024 1706 Last data filed at 10/09/2024 2200 Gross per 24 hour  Intake 250 ml  Output --  Net 250 ml    Filed Weights   09/27/24 1630 09/28/24 0315 10/10/24 1203  Weight: (!) 205.6 kg (!) 205.6 kg (!) 193.6 kg    Examination:  General: No acute distress. Cardiovascular: RRR Lungs: unlabored Abdomen:  diffusely TTP Neurological: Alert and oriented 3. Moves all extremities 4 with equal strength. Cranial nerves II through XII grossly intact. Extremities: No clubbing or cyanosis. No edema.   Data Reviewed: I have personally reviewed following labs and imaging studies  CBC: Recent Labs  Lab 10/06/24 0420 10/07/24 0529 10/08/24 0746 10/09/24 0253 10/10/24 0333  WBC 12.9* 10.7* 9.6 9.1 9.9  NEUTROABS  --   --   --  6.0 6.8  HGB 12.0 12.0 11.8* 12.7 12.8  HCT 37.2 37.8 36.9 39.8 40.1  MCV 84.9 84.9 85.6 85.4 86.2  PLT 299 323 367 388 480*    Basic Metabolic Panel: Recent Labs  Lab 10/04/24 0422 10/04/24 1856 10/06/24 0420 10/06/24 1805 10/07/24 0529 10/07/24 1918 10/08/24 0459 10/08/24 0746 10/09/24 0253 10/10/24 0333  NA 137   < > 138   < > 139 137 139 138 137 137  K 3.6   < > 3.4*  --  3.7  --   --  3.5 4.3 4.3  CL 102   < > 100  --  104  --   --  102 102 101  CO2 25   < > 25  --  26  --   --  28 21* 23  GLUCOSE 132*   < > 116*  --  103*  --   --  107* 103* 126*  BUN 10   < > 8  --  10  --   --  9 11 17   CREATININE 1.06*   < > 1.15*  --  1.09*  --   --  0.97 1.00 1.33*  CALCIUM 8.1*   < > 8.5*  --  8.5*  --   --  8.6* 9.3 9.9  MG 2.1  --   --   --  2.1  --   --   --  2.2 2.4  PHOS 3.2  --   --   --  3.6  --   --   --  4.0 4.4   < > = values in this interval not displayed.    GFR: Estimated Creatinine Clearance: 90.5 mL/min (Ashlee Gordon) (by C-G formula based on SCr of 1.33 mg/dL (H)).  Liver Function Tests: Recent Labs  Lab 10/08/24 2142 10/09/24 0253 10/10/24 0333  AST 32 33 43*  ALT 34 33 52*  ALKPHOS 200* 187* 216*  BILITOT 0.5 0.5 0.4  PROT 8.0 7.6 8.3*  ALBUMIN 3.4* 3.3* 3.9    CBG: No results for input(s): GLUCAP in the last 168 hours.    No results found for this or any previous visit (from the past 240 hours).       Radiology Studies: NM Hepatobiliary Liver Func Result Date: 10/09/2024 CLINICAL DATA:  Cholelithiasis. Right upper quadrant  abdominal pain. EXAM: NUCLEAR MEDICINE HEPATOBILIARY IMAGING TECHNIQUE: Sequential images of the abdomen were obtained out to 60 minutes following intravenous administration of radiopharmaceutical. RADIOPHARMACEUTICALS:  5.49 mCi Tc-72m  Choletec  IV COMPARISON:  CT October 08, 2024 FINDINGS: Prompt uptake and biliary excretion of activity by the liver is seen. Gallbladder activity is visualized,  consistent with patency of cystic duct. Biliary activity passes into small bowel, consistent with patent common bile duct. IMPRESSION: No scintigraphic evidence of acute cholecystitis. Electronically Signed   By: Reyes Holder M.D.   On: 10/09/2024 17:45   CT ABDOMEN PELVIS W CONTRAST Result Date: 10/08/2024 CLINICAL DATA:  Abdominal pain. EXAM: CT ABDOMEN AND PELVIS WITH CONTRAST TECHNIQUE: Multidetector CT imaging of the abdomen and pelvis was performed using the standard protocol following bolus administration of intravenous contrast. RADIATION DOSE REDUCTION: This exam was performed according to the departmental dose-optimization program which includes automated exposure control, adjustment of the mA and/or kV according to patient size and/or use of iterative reconstruction technique. CONTRAST:  75mL OMNIPAQUE  IOHEXOL  350 MG/ML SOLN COMPARISON:  CT abdomen pelvis dated 08/09/2022. FINDINGS: Lower chest: The visualized lung bases are clear. No intra-abdominal free air or free fluid. Hepatobiliary: The liver is unremarkable. No biliary dilatation. Gallstone. No pericholecystic fluid or evidence of acute cholecystitis by CT. Pancreas: Unremarkable. No pancreatic ductal dilatation or surrounding inflammatory changes. Spleen: Normal in size without focal abnormality. Adrenals/Urinary Tract: The adrenal glands unremarkable. There is mild left hydronephrosis. The right kidney is unremarkable. The visualized ureters and urinary bladder appear unremarkable. Stomach/Bowel: Postsurgical changes of gastric sleeve. There is no  bowel obstruction or active inflammation. The appendix is normal. Vascular/Lymphatic: The abdominal aorta and IVC unremarkable. No portal venous gas. There is no adenopathy. Reproductive: The uterus appears mildly enlarged. No suspicious adnexal masses. Other: None Musculoskeletal: Degenerative changes of the spine. No acute osseous pathology. IMPRESSION: 1. Mild left hydronephrosis. 2. Cholelithiasis. 3. No bowel obstruction. Normal appendix. 4. Enlarged uterus. Electronically Signed   By: Vanetta Chou M.D.   On: 10/08/2024 20:14        Scheduled Meds:  amLODipine   10 mg Oral Daily   aspirin   81 mg Oral Daily   buPROPion   150 mg Oral Daily   carvedilol   12.5 mg Oral BID WC   Chlorhexidine  Gluconate Cloth  6 each Topical Daily   prochlorperazine   10 mg Intravenous Once   And   diphenhydrAMINE   25 mg Oral Once   DULoxetine   30 mg Oral Daily   gabapentin   400 mg Oral TID   heparin  injection (subcutaneous)  5,000 Units Subcutaneous Q8H   hydrALAZINE   100 mg Oral Q8H   lidocaine   2 patch Transdermal Daily   losartan   50 mg Oral Daily   multivitamin with minerals  1 tablet Oral Daily   niMODipine   60 mg Oral Q4H   Or   niMODipine   60 mg Per Tube Q4H   nystatin    Topical BID   pantoprazole   40 mg Oral Daily   polyethylene glycol  17 g Oral Daily   senna-docusate  1 tablet Oral BID   Continuous Infusions:  lactated ringers      promethazine  (PHENERGAN ) injection (IM or IVPB)       LOS: 13 days    Time spent: over 30 min    Meliton Monte, MD [facility]   To contact the attending provider between 7A-7P or the covering provider during after hours 7P-7A, please log into the web site [email or web address] and access using universal [place] password for that web site. If you do not have the password, please call the hospital operator.  10/10/2024, 5:06 PM

## 2024-10-10 NOTE — Progress Notes (Signed)
 Occupational Therapy Treatment Patient Details Name: Ashlee Gordon MRN: [id number] DOB: 22-Jul-1971 Today's Date: 10/10/2024   History of present illness [name] adm 09/27/24 with sharp HA and emesis at [facility], CT (+) SAH, transfer to [facility] . CT angiogram Large aneurysm; 12/5 intubated for coiling embolization; extubated 12/6. PMHx: HTN, morbid obesity, prediabetes, depression, fibromyalgia, OA   OT comments  Pt is making steady progress towards their acute OT goals. Pt seen with PT for safety. Overall pt was able to complete transfers, stair negotiation and functional mobility with a RW with generalized superivsion A, cues and +2 for a chair follow. When walking in the hall, pt noted to have a L drift. She toileted and completed hygiene with generalized supervision with cues for safety and problem solving. Pt had a labile affect throughout session. Significantly increased time needed for all tasks, sitting rest breaks required and maximal verbal encouragement. OT to continue to follow acutely to facilitate progress towards established goals. Pt will continue to benefit from [facility]       If plan is discharge home, recommend the following:  Help with stairs or ramp for entrance;Assist for transportation;Assistance with cooking/housework;A lot of help with bathing/dressing/bathroom;Direct supervision/assist for financial management;Direct supervision/assist for medications management;A little help with walking and/or transfers   Equipment Recommendations  Other (comment);BSC/3in1 bari      Precautions / Restrictions Precautions Precautions: Fall Recall of Precautions/Restrictions: Impaired Precaution/Restrictions Comments: SBP <170, watch SpO2 Restrictions Weight Bearing Restrictions Per Provider Order: No       Mobility Bed Mobility               General bed mobility comments: OOB on arrival    Transfers Overall transfer level: Needs assistance Equipment used: Rolling walker  (2 wheels) Transfers: Sit to/from Stand Sit to Stand: Supervision, +2 safety/equipment           General transfer comment: +2 for safety, pt completed several STS this session, no physical assist needed. increased time and encouragement needed. Pt counts herself down from 10 with each transfer     Balance Overall balance assessment: Needs assistance Sitting-balance support: Feet supported Sitting balance-Leahy Scale: Fair     Standing balance support: During functional activity, No upper extremity supported Standing balance-Leahy Scale: Fair Standing balance comment: statically                           ADL either performed or assessed with clinical judgement   ADL Overall ADL's : Needs assistance/impaired                       Lower Body Dressing Details (indicate cue type and reason): encouraged LB dressing, pt declined. Pt states i can do it and if I cant ill have help Toilet Transfer: Supervision/safety;Ambulation;Rolling walker (2 wheels);BSC/3in1 Toilet Transfer Details (indicate cue type and reason): BSC over toilet Toileting- Clothing Manipulation and Hygiene: Supervision/safety;Sit to/from stand Toileting - Clothing Manipulation Details (indicate cue type and reason): peri care in standing     Functional mobility during ADLs: Supervision/safety;Rolling walker (2 wheels);+2 for safety/equipment General ADL Comments: +2 chair follow - pt required maximal encouragement and significantly increased time to complete all tasks. Overall no physical assist required for toielting, tranfsers, walking or stair negotiation    Extremity/Trunk Assessment Upper Extremity Assessment Upper Extremity Assessment: Generalized weakness   Lower Extremity Assessment Lower Extremity Assessment: Defer to PT evaluation        Vision  Vision Assessment?: No apparent visual deficits;Wears glasses for reading   Perception Perception Perception: Not tested    Praxis Praxis Praxis: Not tested   Communication Communication Communication: No apparent difficulties Factors Affecting Communication:  (pt soft spoken and mumbling her words at times)   Cognition Arousal: Alert Behavior During Therapy: Flat affect Cognition: No family/caregiver present to determine baseline             OT - Cognition Comments: pt was pleasant, alert and conversing with a visitor on arrival. During session pt was very flat, offering very little verbalizations, quick to frustration, did not follow all safety cues and was self limiting                 Following commands: Impaired Following commands impaired: Follows one step commands inconsistently      Cueing   Cueing Techniques: Verbal cues, Tactile cues, Visual cues        General Comments VSS on RA, HR 80s-102    Pertinent Vitals/ Pain       Pain Assessment Pain Assessment: Faces Faces Pain Scale: Hurts a little bit Pain Location: hips Pain Descriptors / Indicators: Aching, Guarding, Discomfort Pain Intervention(s): Monitored during session   Frequency  Min 2X/week        Progress Toward Goals  OT Goals(current goals can now be found in the care plan section)  Progress towards OT goals: Progressing toward goals     Plan      Co-evaluation    PT/OT/SLP Co-Evaluation/Treatment: Yes Reason for Co-Treatment: For patient/therapist safety;To address functional/ADL transfers;Necessary to address cognition/behavior during functional activity   OT goals addressed during session: ADL's and self-care      AM-PAC OT 6 Clicks Daily Activity     Outcome Measure   Help from another person eating meals?: None Help from another person taking care of personal grooming?: A Little Help from another person toileting, which includes using toliet, bedpan, or urinal?: A Little Help from another person bathing (including washing, rinsing, drying)?: A Little Help from another person to put on  and taking off regular upper body clothing?: A Little Help from another person to put on and taking off regular lower body clothing?: A Little 6 Click Score: 19    End of Session Equipment Utilized During Treatment: Rolling walker (2 wheels)  OT Visit Diagnosis: Other abnormalities of gait and mobility (R26.89);Muscle weakness (generalized) (M62.81)   Activity Tolerance Patient tolerated treatment well   Patient Left in chair;with call bell/phone within reach   Nurse Communication Mobility status        Time: 8977-8880 OT Time Calculation (min): 57 min  Charges: OT General Charges $OT Visit: 1 Visit OT Treatments $Self Care/Home Management : 8-22 mins $Therapeutic Activity: 8-22 mins  Lucie Kendall, OTR/L Acute Rehabilitation Services Office [phone] Secure Chat Communication Preferred   Lucie JONETTA Kendall 10/10/2024, 11:54 AM

## 2024-10-10 NOTE — Progress Notes (Signed)
 Ccmd reported that patient heart rate drop to in the 40's with second degree heart block,  lasted for 15 seconds.

## 2024-10-10 NOTE — Progress Notes (Signed)
°   10/10/24 2304  BiPAP/CPAP/SIPAP  BiPAP/CPAP/SIPAP Pt Type Adult  Reason BIPAP/CPAP not in use Non-compliant

## 2024-10-10 NOTE — Progress Notes (Signed)
 Physical Therapy Treatment Patient Details Name: Ashlee Gordon MRN: [id number] DOB: January 10, 1971 Today's Date: 10/10/2024   History of Present Illness [name] adm 09/27/24 with sharp HA and emesis at [facility], [place] (+) SAH, transfer to [facility] . CT angiogram Large aneurysm; 12/5 intubated for coiling embolization; extubated 12/6. PMHx: HTN, morbid obesity, prediabetes, depression, fibromyalgia, OA    PT Comments  Pt greeted seated in recliner chair, pleasant and agreeable to therapy session. She is making steady progress towards her acute PT goals demonstrated by advanced functional mobility with decreased physical assistance. Pt was supervision for transfers using RW and CGA x2 for safety with gait and stairs. She took significantly increased time to complete all mobility and required max encouragement to participate in treatment. Pt ambulated ~59ft using RW with a step-through gait pattern. She was unsteady in the hallway primarily drifting to the left. Educated pt on stair training in accordance with her home set-up. Discussed how family should be positioned to support her. She ascended/descended 5 steps twice using a step-to pattern and bilateral handrails. RN requested a standing weight on the pt. She completed 3 step-ups to scale, but a weight was unable to be obtained. Will continue to follow acutely and advance appropriately.     If plan is discharge home, recommend the following: Assist for transportation;Help with stairs or ramp for entrance   Can travel by private vehicle        Equipment Recommendations  Rolling walker (2 wheels) Cipriano)    Recommendations for Other Services       Precautions / Restrictions Precautions Precautions: Fall Recall of Precautions/Restrictions: Impaired Precaution/Restrictions Comments: SBP <170 Restrictions Weight Bearing Restrictions Per Provider Order: No     Mobility  Bed Mobility               General bed mobility comments: Not assessed.  Pt greeted seated in recliner chair at begining of session and returned there at end of session.    Transfers Overall transfer level: Needs assistance Equipment used: Rolling walker (2 wheels) Transfers: Sit to/from Stand Sit to Stand: Supervision, +2 safety/equipment           General transfer comment: Pt completed sit<>stand  multiple times. She transferred to/from commode in bathroom. Pt pulls her trunk upright by using arm rests. She utilized momentum to power up, counting down by 10 and rocking. No physical assist needed. Supervision +2 for safety. Fair eccentric control.    Ambulation/Gait Ambulation/Gait assistance: Contact guard assist, +2 safety/equipment (chair follow) Gait Distance (Feet): 70 Feet (1x10, seated rest on commode, 1x6, seated rest on recliner chair, 1x70) Assistive device: Rolling walker (2 wheels), 1 person hand held assist Gait Pattern/deviations: Step-through pattern, Decreased stride length, Wide base of support, Drifts right/left Gait velocity: decreased Gait velocity interpretation: <1.8 ft/sec, indicate of risk for recurrent falls   General Gait Details: Pt took slow steps. She maintained upright posture, good proximity to RW, and demonstrated enough foot clearence. Pt veered R/L in the hallway, not ambulating on a straight path. Cues for improved technique and safety awareness. Pt taking seated rest break. She declined to attempt walking back to the room following stairs.   Stairs Stairs: Yes Stairs assistance: Contact guard assist, +2 safety/equipment Stair Management: Two rails, Forwards, Step to pattern Number of Stairs: 5 (x2) General stair comments: Educated pt to ascend with stronger leg (LLE) and descend with weaker leg (RLE). Pt took each step one at a time with heavy reliance on railings. She navigated  3 short ~4 steps and 2 standard height ~6 steps twice. Additionally, RN came requesting pt to get on the upright scale to be weighed. She  completed 3 step-ups ~1.5 to get on the scale with BUE support.   Wheelchair Mobility     Tilt Bed    Modified Rankin (Stroke Patients Only)       Balance Overall balance assessment: Needs assistance Sitting-balance support: Feet supported Sitting balance-Leahy Scale: Fair     Standing balance support: During functional activity, No upper extremity supported Standing balance-Leahy Scale: Fair Standing balance comment: statically                            Communication Communication Communication: No apparent difficulties Factors Affecting Communication: Reduced clarity of speech (pt soft spoken and mumbling her words at times)  Cognition Arousal: Alert Behavior During Therapy: Flat affect   PT - Cognitive impairments: Awareness, Attention, Initiation, Safety/Judgement                       PT - Cognition Comments: Pt with decreased insight into current condition. She required max encouragement to participate in therapy session. Pt took prolonged increased time for all mobility, poor initation and decreased safety awareness. Following commands: Impaired Following commands impaired: Follows one step commands inconsistently    Cueing Cueing Techniques: Verbal cues, Tactile cues, Visual cues  Exercises      General Comments General comments (skin integrity, edema, etc.): VSS on RA, HR 80s-102      Pertinent Vitals/Pain Pain Assessment Pain Assessment: Faces Faces Pain Scale: Hurts a little bit Pain Location: Hips Pain Descriptors / Indicators: Aching, Guarding, Discomfort Pain Intervention(s): Monitored during session, Repositioned    Home Living                          Prior Function            PT Goals (current goals can now be found in the care plan section) Acute Rehab PT Goals Patient Stated Goal: Return Home Progress towards PT goals: Progressing toward goals    Frequency    Min 2X/week      PT Plan       Co-evaluation PT/OT/SLP Co-Evaluation/Treatment: Yes Reason for Co-Treatment: For patient/therapist safety;To address functional/ADL transfers;Necessary to address cognition/behavior during functional activity PT goals addressed during session: Mobility/safety with mobility;Balance;Proper use of DME OT goals addressed during session: ADL's and self-care      AM-PAC PT 6 Clicks Mobility   Outcome Measure  Help needed turning from your back to your side while in a flat bed without using bedrails?: A Little Help needed moving from lying on your back to sitting on the side of a flat bed without using bedrails?: A Little Help needed moving to and from a bed to a chair (including a wheelchair)?: A Little Help needed standing up from a chair using your arms (e.g., wheelchair or bedside chair)?: A Lot Help needed to walk in hospital room?: A Lot Help needed climbing 3-5 steps with a railing? : A Lot 6 Click Score: 15    End of Session Equipment Utilized During Treatment: Other (comment) (Pt's body habitus doesn't accomodate gait belt. She declined using a sheet to be wrapped around her for support.) Activity Tolerance: Patient tolerated treatment well Patient left: in chair;with call bell/phone within reach Nurse Communication: Mobility status;Other (comment) (lack of chair alarm  in room) PT Visit Diagnosis: Difficulty in walking, not elsewhere classified (R26.2);Other symptoms and signs involving the nervous system (R29.898)     Time: 8977-8880 PT Time Calculation (min) (ACUTE ONLY): 57 min  Charges:    $Gait Training: 23-37 mins PT General Charges $$ ACUTE PT VISIT: 1 Visit                     Randall SAUNDERS, PT, DPT Acute Rehabilitation Services Office: [phone] Secure Chat Preferred  Delon CHRISTELLA Callander 10/10/2024, 12:59 PM

## 2024-10-11 ENCOUNTER — Other Ambulatory Visit (HOSPITAL_COMMUNITY): Payer: Self-pay

## 2024-10-11 DIAGNOSIS — I609 Nontraumatic subarachnoid hemorrhage, unspecified: Secondary | ICD-10-CM | POA: Diagnosis not present

## 2024-10-11 LAB — COMPREHENSIVE METABOLIC PANEL WITH GFR
ALT: 48 U/L — ABNORMAL HIGH (ref 0–44)
AST: 43 U/L — ABNORMAL HIGH (ref 15–41)
Albumin: 3.1 g/dL — ABNORMAL LOW (ref 3.5–5.0)
Alkaline Phosphatase: 174 U/L — ABNORMAL HIGH (ref 38–126)
Anion gap: 13 (ref 5–15)
BUN: 16 mg/dL (ref 6–20)
CO2: 19 mmol/L — ABNORMAL LOW (ref 22–32)
Calcium: 9.4 mg/dL (ref 8.9–10.3)
Chloride: 103 mmol/L (ref 98–111)
Creatinine, Ser: 1 mg/dL (ref 0.44–1.00)
GFR, Estimated: >60 mL/min
Glucose, Bld: 110 mg/dL — ABNORMAL HIGH (ref 70–99)
Potassium: 4.6 mmol/L (ref 3.5–5.1)
Sodium: 135 mmol/L (ref 135–145)
Total Bilirubin: 0.5 mg/dL (ref 0.0–1.2)
Total Protein: 7.4 g/dL (ref 6.5–8.1)

## 2024-10-11 LAB — URINALYSIS, W/ REFLEX TO CULTURE (INFECTION SUSPECTED)
Bilirubin Urine: NEGATIVE
Glucose, UA: NEGATIVE mg/dL
Hgb urine dipstick: NEGATIVE
Ketones, ur: NEGATIVE mg/dL
Leukocytes,Ua: NEGATIVE
Nitrite: NEGATIVE
Protein, ur: NEGATIVE mg/dL
Specific Gravity, Urine: 1.02 (ref 1.005–1.030)
pH: 6 (ref 5.0–8.0)

## 2024-10-11 LAB — CBC WITH DIFFERENTIAL/PLATELET
Abs Immature Granulocytes: 0.04 K/uL (ref 0.00–0.07)
Basophils Absolute: 0 K/uL (ref 0.0–0.1)
Basophils Relative: 1 %
Eosinophils Absolute: 0.3 K/uL (ref 0.0–0.5)
Eosinophils Relative: 4 %
HCT: 39.6 % (ref 36.0–46.0)
Hemoglobin: 12.6 g/dL (ref 12.0–15.0)
Immature Granulocytes: 1 %
Lymphocytes Relative: 25 %
Lymphs Abs: 1.8 K/uL (ref 0.7–4.0)
MCH: 27.8 pg (ref 26.0–34.0)
MCHC: 31.8 g/dL (ref 30.0–36.0)
MCV: 87.4 fL (ref 80.0–100.0)
Monocytes Absolute: 0.5 K/uL (ref 0.1–1.0)
Monocytes Relative: 7 %
Neutro Abs: 4.6 K/uL (ref 1.7–7.7)
Neutrophils Relative %: 62 %
Platelets: 406 K/uL — ABNORMAL HIGH (ref 150–400)
RBC: 4.53 MIL/uL (ref 3.87–5.11)
RDW: 15 % (ref 11.5–15.5)
WBC: 7.2 K/uL (ref 4.0–10.5)
nRBC: 0 % (ref 0.0–0.2)

## 2024-10-11 LAB — PHOSPHORUS: Phosphorus: 4.1 mg/dL (ref 2.5–4.6)

## 2024-10-11 LAB — MAGNESIUM: Magnesium: 2.2 mg/dL (ref 1.7–2.4)

## 2024-10-11 MED ORDER — DIPHENHYDRAMINE HCL 25 MG PO CAPS
25.0000 mg | ORAL_CAPSULE | Freq: Once | ORAL | Status: AC
  Administered 2024-10-11: 25 mg via ORAL
  Filled 2024-10-11: qty 1

## 2024-10-11 MED ORDER — PROCHLORPERAZINE EDISYLATE 10 MG/2ML IJ SOLN
10.0000 mg | Freq: Once | INTRAMUSCULAR | Status: AC
  Administered 2024-10-11: 10 mg via INTRAVENOUS
  Filled 2024-10-11: qty 2

## 2024-10-11 MED ORDER — BUTALBITAL-APAP-CAFFEINE 50-325-40 MG PO TABS
1.0000 | ORAL_TABLET | Freq: Two times a day (BID) | ORAL | Status: DC | PRN
  Administered 2024-10-12: 1 via ORAL
  Filled 2024-10-11: qty 1

## 2024-10-11 MED ORDER — SODIUM CHLORIDE 0.9 % IV BOLUS
250.0000 mL | Freq: Once | INTRAVENOUS | Status: AC
  Administered 2024-10-11: 250 mL via INTRAVENOUS

## 2024-10-11 NOTE — TOC Progression Note (Signed)
 Transition of Care [facility]) - Progression Note    Patient Details  Name: Kc Sedlak MRN: [id number] Date of Birth: Apr 30, 1971  [facility] CM/SW Contact  Andrez JULIANNA George, RN Phone Number: 10/11/2024, 12:59 PM  Clinical Narrative:     IP Care management continues to follow for further d/c needs.   Expected Discharge Plan: OP Rehab Barriers to Discharge: Continued Medical Work up               Expected Discharge Plan and Services   Discharge Planning Services: CM Consult Post Acute Care Choice: Durable Medical Equipment Living arrangements for the past 2 months: Mobile Home                                       Social Drivers of Health (SDOH) Interventions SDOH Screenings   Depression (PHQ2-9): Medium Risk (11/22/2022)  Social Connections: Unknown (07/12/2022)   Received from [facility]  Tobacco Use: Low Risk (09/27/2024)    Readmission Risk Interventions     No data to display

## 2024-10-11 NOTE — Progress Notes (Signed)
 " PROGRESS NOTE    Ashlee Gordon  FMW:[id number] DOB: 1971/03/12 DOA: 09/27/2024 PCP: [facility]  Chief Complaint  Patient presents with   Headache   Emesis    Brief Narrative:    53 year old female with significant past medical history of  who presented to [facility] ED after Ynez Eugenio sudden onset of Hasini Peachey severe headache approximately around 0530 on 12/5 due to [facility].  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 Added gabapentin , Fioricet , PRN fentanyl . Also asking to try CPAP while in hospital. New SBP goal of < 200 per Dr. Lester 12/8 CT head with mild improvement in [facility] 12/12 persistent headache, hip and back pain -Lidoderm  patches, Xanax  added 12/15 - Transferred out of ICU. NIH liberalized to q 4 hours. 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  CT 12/6 with extensive SAH.   12/8 CT with mild interval improvement of SAH.  TCD 12/15 with elevated R middle cerebral artery mean flow velocities suggesting mild vasospasm and elveated L MCA mean flow velocities suggest borderline mild vasospasm.   12/15 neuro IR note, possible d/c day 12 if no changes - no new nsgy recs today she  Headache Trial HA cocktail - per discussion with nsgy, HA can last up to Reuben Knoblock year  Hypertension Hydralazine , coreg  (dose decreased), amlodipine , losartan , nimodipine  (will discontinue, this  requires Caressa Scearce prior auth - discussed with nsgy) Goal < 160  Bradycardia  Decrease coreg , add holding parameters  Abdominal Pain C/o generalized pain RUQ US  shows possible cholecystitis HIDA scan without evidence cholecystitis  CT shows mild L hydro, not clear significance, discussed with urology, low suspicion clinically significant Will c/s general surgery  Depression  Fibromyalgia Chronic Pain Bupropion   Increase gabapentin  - uptitrate as tolerated Cymbalta  maybe good option to try, but this will take several weeks Trial flexeril   Suspect her generalized pain is chronic, we discussed we're unlikely to completely control her pain in the hospital  Bilateral LE Pain Suspect this represents sciatica based on her description Will uptitrate gabapentin    Prediabetes Noted  Obesity, Class III Body mass index is 64.91 kg/m.    DVT prophylaxis: heparin  Code Status: full Family Communication: none Disposition:   Status is: Inpatient Remains inpatient appropriate because: need for continued inpatient care   Consultants:  PCCM Neuro IR  Procedures:   Balloon assisted coil embolization of ruptured left internal carotid artery aneurysm from femoral access, placement of right radial Daysha Ashmore line with ultrasound guidance.  Antimicrobials:  Anti-infectives (From admission, onward)    None       Subjective:  Continued HA Also back pain, sciatica  Objective: Vitals:   10/10/24 2344 10/11/24 0414 10/11/24 0708 10/11/24 1152  BP: 110/60 120/62 139/73 111/72  Pulse: 77 68 78 74  Resp:  16 18 18   Temp: 98.1 F (36.7 C) 98.2 F (36.8 C) 98.1 F (36.7 C) 97.9 F (36.6 C)  TempSrc: Oral Oral Oral Oral  SpO2: 100% 98% 98% 100%  Weight:      Height:       No intake or output data in the 24 hours ending 10/11/24 1524   Filed Weights   09/27/24 1630 09/28/24 0315 10/10/24 1203  Weight: (!) 205.6 kg (!) 205.6 kg (!) 193.6 kg    Examination:  General: No acute  distress. Cardiovascular: RRR Lungs: unlabored Abdomen: Soft, nontender, nondistended  Neurological: Alert and oriented 3. Moves all extremities 4 with equal strength. Cranial nerves II through XII grossly intact. Extremities: No clubbing or cyanosis. No edema.   Data Reviewed: I have personally reviewed following labs and imaging studies  CBC: Recent Labs  Lab 10/07/24 0529 10/08/24 0746 10/09/24 0253 10/10/24 0333 10/11/24 0607  WBC 10.7* 9.6 9.1 9.9 7.2  NEUTROABS  --   --  6.0 6.8 4.6  HGB 12.0 11.8* 12.7 12.8 12.6  HCT 37.8 36.9 39.8 40.1 39.6  MCV 84.9 85.6 85.4 86.2 87.4  PLT 323 367 388 480* 406*    Basic Metabolic Panel: Recent Labs  Lab 10/07/24 0529 10/07/24 1918 10/08/24 0459 10/08/24 0746 10/09/24 0253 10/10/24 0333 10/11/24 0607  NA 139   < > 139 138 137 137 135  K 3.7  --   --  3.5 4.3 4.3 4.6  CL 104  --   --  102 102 101 103  CO2 26  --   --  28 21* 23 19*  GLUCOSE 103*  --   --  107* 103* 126* 110*  BUN 10  --   --  9 11 17 16   CREATININE 1.09*  --   --  0.97 1.00 1.33* 1.00  CALCIUM 8.5*  --   --  8.6* 9.3 9.9 9.4  MG 2.1  --   --   --  2.2 2.4 2.2  PHOS 3.6  --   --   --  4.0 4.4 4.1   < > = values in this interval not displayed.    GFR: Estimated Creatinine Clearance: 120.3 mL/min (by C-G formula based on SCr of 1 mg/dL).  Liver Function Tests: Recent Labs  Lab 10/08/24 2142 10/09/24 0253 10/10/24 0333 10/11/24 0607  AST 32 33 43* 43*  ALT 34 33 52* 48*  ALKPHOS 200* 187* 216* 174*  BILITOT 0.5 0.5 0.4 0.5  PROT 8.0 7.6 8.3* 7.4  ALBUMIN 3.4* 3.3* 3.9 3.1*    CBG: No results for input(s): GLUCAP in the last 168 hours.    No results found for this or any previous visit (from the past 240 hours).       Radiology Studies: No results found.       Scheduled Meds:  amLODipine   10 mg Oral Daily   aspirin   81 mg Oral Daily   buPROPion   150 mg Oral Daily   carvedilol   12.5 mg Oral BID WC   Chlorhexidine   Gluconate Cloth  6 each Topical Daily   prochlorperazine   10 mg Intravenous Once   And   diphenhydrAMINE   25 mg Oral Once   DULoxetine   30 mg Oral Daily   gabapentin   600 mg Oral TID   heparin  injection (subcutaneous)  5,000 Units Subcutaneous Q8H   hydrALAZINE   100 mg Oral Q8H   lidocaine   2 patch Transdermal Daily   losartan   50 mg Oral Daily   multivitamin with minerals  1 tablet Oral Daily   niMODipine   60 mg Oral Q4H  Or   niMODipine   60 mg Per Tube Q4H   nystatin    Topical BID   pantoprazole   40 mg Oral Daily   polyethylene glycol  17 g Oral Daily   senna-docusate  1 tablet Oral BID   Continuous Infusions:  promethazine  (PHENERGAN ) injection (IM or IVPB)     sodium chloride        LOS: 14 days    Time spent: over 30 min    Meliton Monte, MD [facility]   To contact the attending provider between 7A-7P or the covering provider during after hours 7P-7A, please log into the web site [email or web address] and access using universal [place] password for that web site. If you do not have the password, please call the hospital operator.  10/11/2024, 3:24 PM    "

## 2024-10-11 NOTE — Progress Notes (Signed)
 Physical Therapy Treatment Patient Details Name: Ashlee Gordon MRN: [id number] DOB: 29-Jul-1971 Today's Date: 10/11/2024   History of Present Illness [name] adm 09/27/24 with sharp HA and emesis at [facility], [place] (+) SAH, transfer to [facility] . CT angiogram Large aneurysm; 12/5 intubated for coiling embolization; extubated 12/6. PMHx: HTN, morbid obesity, prediabetes, depression, fibromyalgia, OA.    PT Comments  Pt is making steady progress towards her acute PT goals demonstrated by advanced functional mobility with decreased physical assistance. She increased gait distance ambulating ~53ft twice using RW. She demonstrated a reciprocal gait pattern with improved stability and increased safety awareness. No drifting, veering, or LOB noted. Pt took 2 seated rest breaks before/after stairs. She ascended/descended a total of 6 standard height steps using a step-to pattern with BUE support. Pt will benefit from acute skilled PT to increase her independence and safety with mobility to allow discharge home with HHPT.     If plan is discharge home, recommend the following: Assist for transportation;Help with stairs or ramp for entrance   Can travel by private vehicle        Equipment Recommendations  Rolling walker (2 wheels) Cipriano)    Recommendations for Other Services       Precautions / Restrictions Precautions Precautions: Fall Recall of Precautions/Restrictions: Intact Precaution/Restrictions Comments: SBP <170 Restrictions Weight Bearing Restrictions Per Provider Order: No     Mobility  Bed Mobility Overal bed mobility: Needs Assistance Bed Mobility: Supine to Sit, Sit to Supine     Supine to sit: Contact guard, HOB elevated, Used rails Sit to supine: Supervision   General bed mobility comments: Pt sat up on L side of the bed with increased time. She brought BLE towards EOB. Pt counted down from 10 before moving. HOB slightly raised. Pt pulled on rail and PT to elevate trunk. She  scooted fwd til feet flat on floor. Bed height raised per pt's request. Returning to bed flat, pt swung BLE back in and repositioned herself.    Transfers Overall transfer level: Needs assistance Equipment used: Rolling walker (2 wheels) Transfers: Sit to/from Stand Sit to Stand: Supervision, From elevated surface           General transfer comment: Pt stood from raised bed height and standard chair. She varied hand positioning between one up one down, BUE on knees, or BUE on RW. Pt powered up on her own without physical assist. Close supervision for safety. Pt relied on momentum, rocking fwd/bkwd, and counting down from 10. Good eccentric control, reaching back for bed/chair.    Ambulation/Gait Ambulation/Gait assistance: Supervision Gait Distance (Feet): 80 Feet (x2) Assistive device: Rolling walker (2 wheels) Gait Pattern/deviations: Step-through pattern, Decreased stride length, Wide base of support Gait velocity: reduced Gait velocity interpretation: 1.31 - 2.62 ft/sec, indicative of limited community ambulator   General Gait Details: Pt ambulated with slow steady steps. She maintained upright posture with good proximity to RW. Pt navigated room/hallway well maintaining a straight trajectory, without sway or LOB.   Stairs Stairs: Yes Stairs assistance: Supervision Stair Management: Two rails, Forwards, Step to pattern Number of Stairs: 2 (x3) General stair comments: Pt completed standard height ~6 steps a total of 6 times in a row. She maintained BUE support on railing. Pt ascended with LLE and descended with RLE one step at a time. Discussed how her family should be positioned to support her at home.   Wheelchair Mobility     Tilt Bed    Modified Rankin (Stroke Patients  Only)       Balance Overall balance assessment: Mild deficits observed, not formally tested                                          Communication  Communication Communication: No apparent difficulties  Cognition Arousal: Alert Behavior During Therapy: WFL for tasks assessed/performed   PT - Cognitive impairments: No apparent impairments                         Following commands: Intact      Cueing Cueing Techniques: Verbal cues  Exercises      General Comments General comments (skin integrity, edema, etc.): HR 94-105bpm with activity      Pertinent Vitals/Pain Pain Assessment Pain Assessment: Faces Faces Pain Scale: Hurts little more Pain Location: L Hip / Sciatica Pain Descriptors / Indicators: Discomfort, Pins and needles, Shooting Pain Intervention(s): Monitored during session, Repositioned    Home Living                          Prior Function            PT Goals (current goals can now be found in the care plan section) Acute Rehab PT Goals Patient Stated Goal: Return Home PT Goal Formulation: With patient Time For Goal Achievement: 10/25/24 Potential to Achieve Goals: Good Progress towards PT goals: Progressing toward goals;Goals updated    Frequency    Min 2X/week      PT Plan      Co-evaluation              AM-PAC PT 6 Clicks Mobility   Outcome Measure  Help needed turning from your back to your side while in a flat bed without using bedrails?: A Little Help needed moving from lying on your back to sitting on the side of a flat bed without using bedrails?: A Little Help needed moving to and from a bed to a chair (including a wheelchair)?: A Little Help needed standing up from a chair using your arms (e.g., wheelchair or bedside chair)?: A Little Help needed to walk in hospital room?: A Little Help needed climbing 3-5 steps with a railing? : A Little 6 Click Score: 18    End of Session   Activity Tolerance: Patient tolerated treatment well Patient left: in bed;with call bell/phone within reach;with bed alarm set Nurse Communication: Mobility status PT Visit  Diagnosis: Difficulty in walking, not elsewhere classified (R26.2);Other symptoms and signs involving the nervous system (R29.898)     Time: 8662-8592 PT Time Calculation (min) (ACUTE ONLY): 30 min  Charges:    $Gait Training: 23-37 mins PT General Charges $$ ACUTE PT VISIT: 1 Visit                     Randall SAUNDERS, PT, DPT Acute Rehabilitation Services Office: [phone] Secure Chat Preferred  Delon CHRISTELLA Callander 10/11/2024, 2:24 PM

## 2024-10-12 ENCOUNTER — Other Ambulatory Visit (HOSPITAL_COMMUNITY): Payer: Self-pay

## 2024-10-12 LAB — COMPREHENSIVE METABOLIC PANEL WITH GFR
ALT: 56 U/L — ABNORMAL HIGH (ref 0–44)
AST: 34 U/L (ref 15–41)
Albumin: 3.5 g/dL (ref 3.5–5.0)
Alkaline Phosphatase: 163 U/L — ABNORMAL HIGH (ref 38–126)
Anion gap: 11 (ref 5–15)
BUN: 13 mg/dL (ref 6–20)
CO2: 24 mmol/L (ref 22–32)
Calcium: 9.2 mg/dL (ref 8.9–10.3)
Chloride: 104 mmol/L (ref 98–111)
Creatinine, Ser: 0.95 mg/dL (ref 0.44–1.00)
GFR, Estimated: >60 mL/min
Glucose, Bld: 101 mg/dL — ABNORMAL HIGH (ref 70–99)
Potassium: 4.3 mmol/L (ref 3.5–5.1)
Sodium: 139 mmol/L (ref 135–145)
Total Bilirubin: 0.4 mg/dL (ref 0.0–1.2)
Total Protein: 7.5 g/dL (ref 6.5–8.1)

## 2024-10-12 LAB — CBC WITH DIFFERENTIAL/PLATELET
Abs Immature Granulocytes: 0.04 K/uL (ref 0.00–0.07)
Basophils Absolute: 0.1 K/uL (ref 0.0–0.1)
Basophils Relative: 1 %
Eosinophils Absolute: 0.2 K/uL (ref 0.0–0.5)
Eosinophils Relative: 3 %
HCT: 38.6 % (ref 36.0–46.0)
Hemoglobin: 12.4 g/dL (ref 12.0–15.0)
Immature Granulocytes: 1 %
Lymphocytes Relative: 28 %
Lymphs Abs: 1.8 K/uL (ref 0.7–4.0)
MCH: 27.7 pg (ref 26.0–34.0)
MCHC: 32.1 g/dL (ref 30.0–36.0)
MCV: 86.2 fL (ref 80.0–100.0)
Monocytes Absolute: 0.5 K/uL (ref 0.1–1.0)
Monocytes Relative: 7 %
Neutro Abs: 4.1 K/uL (ref 1.7–7.7)
Neutrophils Relative %: 60 %
Platelets: 452 K/uL — ABNORMAL HIGH (ref 150–400)
RBC: 4.48 MIL/uL (ref 3.87–5.11)
RDW: 14.9 % (ref 11.5–15.5)
WBC: 6.7 K/uL (ref 4.0–10.5)
nRBC: 0 % (ref 0.0–0.2)

## 2024-10-12 LAB — PHOSPHORUS: Phosphorus: 3.9 mg/dL (ref 2.5–4.6)

## 2024-10-12 LAB — MAGNESIUM: Magnesium: 2.1 mg/dL (ref 1.7–2.4)

## 2024-10-12 MED ORDER — ASPIRIN 81 MG PO CHEW
81.0000 mg | CHEWABLE_TABLET | Freq: Every day | ORAL | 0 refills | Status: DC
  Filled 2024-10-12: qty 90, 90d supply, fill #0

## 2024-10-12 MED ORDER — BUTALBITAL-APAP-CAFFEINE 50-325-40 MG PO TABS
1.0000 | ORAL_TABLET | Freq: Two times a day (BID) | ORAL | 0 refills | Status: AC | PRN
  Filled 2024-10-12: qty 20, 10d supply, fill #0

## 2024-10-12 MED ORDER — CARVEDILOL 12.5 MG PO TABS
12.5000 mg | ORAL_TABLET | Freq: Two times a day (BID) | ORAL | 0 refills | Status: AC
  Filled 2024-10-12: qty 180, 90d supply, fill #0

## 2024-10-12 MED ORDER — CYCLOBENZAPRINE HCL 5 MG PO TABS
2.5000 mg | ORAL_TABLET | Freq: Three times a day (TID) | ORAL | 0 refills | Status: AC | PRN
  Filled 2024-10-12: qty 30, 10d supply, fill #0

## 2024-10-12 MED ORDER — GABAPENTIN 300 MG PO CAPS
600.0000 mg | ORAL_CAPSULE | Freq: Three times a day (TID) | ORAL | 0 refills | Status: AC
  Filled 2024-10-12: qty 540, 90d supply, fill #0

## 2024-10-12 MED ORDER — HYDRALAZINE HCL 100 MG PO TABS
100.0000 mg | ORAL_TABLET | Freq: Three times a day (TID) | ORAL | 0 refills | Status: AC
  Filled 2024-10-12: qty 270, 90d supply, fill #0

## 2024-10-12 MED ORDER — PANTOPRAZOLE SODIUM 40 MG PO TBEC
40.0000 mg | DELAYED_RELEASE_TABLET | Freq: Every day | ORAL | 0 refills | Status: AC
  Filled 2024-10-12: qty 30, 30d supply, fill #0

## 2024-10-12 MED ORDER — DULOXETINE HCL 30 MG PO CPEP
30.0000 mg | ORAL_CAPSULE | Freq: Every day | ORAL | 0 refills | Status: AC
  Filled 2024-10-12: qty 30, 30d supply, fill #0

## 2024-10-12 MED ORDER — AMLODIPINE BESYLATE 10 MG PO TABS
10.0000 mg | ORAL_TABLET | Freq: Every day | ORAL | 0 refills | Status: AC
  Filled 2024-10-12: qty 90, 90d supply, fill #0

## 2024-10-12 NOTE — Discharge Summary (Signed)
 Physician Discharge Summary  Ashlee Gordon FMW:[id number] DOB: [birth year] DOA: 09/27/2024  PCP: [facility]  Admit date: 09/27/2024 Discharge date: 10/12/2024  Time spent: 40 minutes  Recommendations for Outpatient Follow-up:  Follow outpatient CBC/CMP  Follow with neuro IR outpatient Follow blood pressure outpatient - adjust meds prn  Follow chronic pain outpatient - adjust meds as needed (uptitrate cymbalta , gabapentin  as needed/tolerated) Mild hydro on L, low suspicion clinically significant, follow outpatient Follow headache, adjust regimen as needed Encourage weight loss Mild LFT elevation - follow outpatient   Discharge Diagnoses:  Principal Problem:   SAH (subarachnoid hemorrhage) (HCC)   Discharge Condition: stable  Diet recommendation: heart healthy  Filed Weights   09/27/24 1630 09/28/24 0315 10/10/24 1203  Weight: (!) 205.6 kg (!) 205.6 kg (!) 193.6 kg    History of present illness:   53 year old female with significant past medical history of  who presented [facility] ED after Ashlee Gordon sudden onset of Ashlee Gordon severe headache approximately around 0530 on 12/5 due to Ashlee Gordon.   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 Added gabapentin , Fioricet , PRN fentanyl . Also asking to try CPAP while in Gordon. New SBP goal of < 200 per Dr. Lester 12/8 CT head with mild improvement in [facility] 12/12 persistent headache, hip and back pain -Lidoderm  patches, Xanax  added 12/15 - Transferred out of ICU. NIH liberalized to q 4 hours. 12/16 TRH assumed care Stable for discharge 12/20    Gordon Course:  Assessment and Plan:  Subarachnoid Hemorrhage due to L ICA Aneurysm CT  12/5 with aneurysmal rupture pattern acute SAH CTA with large and mildly irregular supraclinoid L ICA aneurysm Now s/p coiling of an aneurysm arising from the supraclinoid segment of the L ICA.  CT 12/6 with extensive SAH.   12/8 CT with mild interval improvement of SAH.  TCD 12/15 with elevated R middle cerebral artery mean flow velocities suggesting mild vasospasm and elveated L MCA mean flow velocities suggest borderline mild vasospasm.   12/15 neuro IR note, possible d/c day 12 if no changes - no new nsgy recs today She's stable for discharge today, notified nsgy of plan for discharge   Headache per discussion with nsgy, HA can last up to Ellamarie Naeve year Fioricet  prn   Hypertension Hydralazine , coreg  (dose decreased), amlodipine , losartan , nimodipine  (will discontinue, this requires Ardys Hataway prior auth - discussed with nsgy) Goal < 160   Bradycardia  Improved with lower dose coreg    Abdominal Pain C/o generalized pain RUQ US  shows possible cholecystitis HIDA scan without evidence cholecystitis - CCS signed off CT shows mild L hydro, not clear significance, discussed with urology, low suspicion clinically significant - outpatient follow up Will c/s general surgery   Depression  Fibromyalgia Chronic Pain Bupropion   Increase gabapentin  - uptitrate as tolerated Cymbalta  maybe good option to try, but this will take several weeks Suspect her generalized pain is chronic, we discussed we're unlikely to completely control her pain in the Gordon - discharging with cymbalta , increased gabapentin , flexeril  prn   Bilateral LE Pain Suspect this represents sciatica based on her description Gabapentin , follow outpatient    Prediabetes Noted   Obesity, Class III Body mass index is 64.91 kg/m.  Procedures: Balloon assisted coil embolization of ruptured left internal carotid artery aneurysm from femoral access, placement of right radial Travis Mastel line with ultrasound guidance.   Echo MPRESSIONS      1. Technically difficult; apical images poor. Elevated mean gradient (13  mmHg) across aortic valve likely related to hyperdynamic LV function;  visually aortic valve opens well.   2. Left ventricular ejection fraction, by estimation, is >75%. The left  ventricle has hyperdynamic function. The left ventricle has no regional  wall motion abnormalities. There is moderate left ventricular hypertrophy.  Left ventricular diastolic  parameters are indeterminate.   3. Right ventricular systolic function was not well visualized. The right  ventricular size is not well visualized.   4. The mitral valve is normal in structure. Trivial mitral valve  regurgitation. No evidence of mitral stenosis.   5. The aortic valve is tricuspid. Aortic valve regurgitation is not  visualized. No aortic stenosis is present.   6. The inferior vena cava is normal in size with greater than 50%  respiratory variability, suggesting right atrial pressure of 3 mmHg.   Comparison(s): No prior Echocardiogram.   Consultations: Nsgy Surgery PCCM  Discharge Exam: Vitals:   10/12/24 0359 10/12/24 0913  BP: (!) 149/88 (!) 149/77  Pulse: 81 69  Resp: 18 17  Temp: 98.6 F (37 C) 98.1 F (36.7 C)  SpO2: 97% 94%   Feels comfortable going home  General: No acute distress. obese Cardiovascular: RRR Lungs: unlabored Abdomen: no abd discomfort today Neurological: Alert and oriented 3. Moves all extremities 4 with equal strength. Cranial nerves II through XII grossly intact. Extremities: No clubbing or cyanosis. No edema.   Discharge Instructions   Discharge Instructions     Ambulatory referral to Occupational Therapy   Complete by: As directed    Ambulatory referral to Physical Therapy   Complete by: As directed    Aquatic therapy   Call MD for:  difficulty breathing, headache or visual disturbances   Complete by: As directed    Call MD for:  extreme fatigue   Complete by: As directed    Call MD  for:  hives   Complete by: As directed    Call MD for:  persistant dizziness or light-headedness   Complete by: As directed    Call MD for:  persistant nausea and vomiting   Complete by: As directed    Call MD for:  redness, tenderness, or signs of infection (pain, swelling, redness, odor or green/yellow discharge around incision site)   Complete by: As directed    Call MD for:  severe uncontrolled pain   Complete by: As directed    Call MD for:  temperature >100.4   Complete by: As directed    Discharge instructions   Complete by: As directed    You were seen for Kadience Macchi brain bleed (subarachnoid hemorrhage).   This was in the setting of an aneurysm of the left internal carotid artery.  This has been treated with coiling.    We've started you on several new blood pressure medicines.  Follow with your outpatient provider for refills.   Your headaches may last up to Mila Pair year.  You can try tylenol  for these.  If this doesn't work, try fioricet  and/or caffeine .  I would try to avoid using fioricet  more than twice Kimiyo Carmicheal week as frequent use of fioricet  can lead to rebound headaches.  Your HIDA scan did not show evidence of cholecystitis (gallbladder inflammation).  Your CT scan showed  mild hydro (enlargement of the left kidney).  This can be followed outpatient, but was not concerning when we discussed the case with urology.    For your fibromyalgia and chronic pain, we started you on cymbalta .  This should be uptitrated as tolerated as an outpatient.  We increased your gabapentin  to 600 mg three times Tinleigh Whitmire day.  I prescribed flexeril  which you can use as needed (use this with caution as it can cause sleepiness/fatigue/drowsiness).    Return for new, recurrent, or worsening symptoms.  Please ask your PCP to request records from this hospitalization so they know what was done and what the next steps will be.   Increase activity slowly   Complete by: As directed    No wound care   Complete by: As  directed       Allergies as of 10/12/2024       Reactions   Metformin  And Related Nausea Only        Medication List     STOP taking these medications    MULTIVITAMIN ADULT PO   nebivolol  2.5 MG tablet Commonly known as: BYSTOLIC    Vitamin D  (Ergocalciferol ) 1.25 MG (50000 UNIT) Caps capsule Commonly known as: DRISDOL    Vitamin D3 50 MCG (2000 UT) capsule       TAKE these medications    amLODipine  10 MG tablet Commonly known as: NORVASC  Take 1 tablet (10 mg total) by mouth daily. Follow with outpatient provider for refills Start taking on: October 13, 2024   aspirin  81 MG chewable tablet Chew 1 tablet (81 mg total) by mouth daily. Start taking on: October 13, 2024   B-12 Compliance Injection 1000 MCG/ML Kit Generic drug: Cyanocobalamin Inject 1,000 mcg as directed See admin instructions. Only at doctor's appointments   buPROPion  150 MG 24 hr tablet Commonly known as: Wellbutrin  XL Take 1 tablet (150 mg total) by mouth daily.   butalbital -acetaminophen -caffeine  50-325-40 MG tablet Commonly known as: FIORICET  Take 1 tablet by mouth 2 (two) times daily as needed for headache. Try not to use this medicine daily as it can lead to rebound headaches.   carvedilol  12.5 MG tablet Commonly known as: COREG  Take 1 tablet (12.5 mg total) by mouth 2 (two) times daily with Ashlee Gordon meal. Follow with your outpatient doctor for refills.   cyclobenzaprine  5 MG tablet Commonly known as: FLEXERIL  Take 0.5-1 tablets (2.5-5 mg total) by mouth 3 (three) times daily as needed for muscle spasms. What changed:  medication strength how much to take when to take this   DULoxetine  30 MG capsule Commonly known as: CYMBALTA  Take 1 capsule (30 mg total) by mouth daily. Follow up with your outpatient doctor for refills.  They should consider uptitrating this dose. Start taking on: October 13, 2024   gabapentin  300 MG capsule Commonly known as: NEURONTIN  Take 2 capsules (600 mg  total) by mouth 3 (three) times daily.   hydrALAZINE  100 MG tablet Commonly known as: APRESOLINE  Take 1 tablet (100 mg total) by mouth every 8 (eight) hours. Follow with your outpatient doctor for refills   HYDROcodone-acetaminophen  10-325 MG tablet Commonly known as: NORCO Take 1 tablet by mouth 3 (three) times daily as needed for severe pain (pain score 7-10) or moderate pain (pain score 4-6).   losartan  50 MG tablet Commonly known as: COZAAR  Take 50 mg by mouth daily.   pantoprazole  40 MG tablet Commonly known as: PROTONIX  Take 1 tablet (40 mg total) by mouth daily. Start taking on: October 13, 2024               Durable Medical Equipment  (From admission, onward)           Start     Ordered   10/08/24 1327  For home use only DME Bedside commode  Once       Comments: 452 lbs--requires bariatric size  Bariatric BSC  Question:  Patient needs Maureen Duesing bedside commode to treat with the following condition  Answer:  Weakness   10/08/24 1326   10/08/24 1325  For home use only DME Walker wide  Once       Comments: 452 lbs--requires wide size  Question:  Patient needs Syria Kestner walker to treat with the following condition  Answer:  Weakness   10/08/24 1326           Allergies[1]  Follow-up Information     [facility]. Schedule an appointment as soon as possible for Slater Mcmanaman visit.   Specialty: Rehabilitation Why: For outpatient OT Contact information: [address] [place]  [address] [phone]        [facility]. Schedule an appointment as soon as possible for Zyden Suman visit.   Why: Aquatic PT Contact information: [address] [place],  [place]  [address]   Main: [phone]        [facility] transportation Follow up.   Why: Call 2-3 days in advance to schedule transportation for appointments Contact information: [phone]        Lester Golas, MD Follow up.   Specialties: Interventional Radiology, Neurosurgery, Diagnostic Radiology Why: Call for Pearlee Arvizu follow up appointment Contact information: [address] [place] [address] [phone]                  The results of significant diagnostics from this hospitalization (including imaging, microbiology, ancillary and laboratory) are listed below for reference.    Significant Diagnostic Studies: NM Hepatobiliary Liver Func Result Date: 10/09/2024 CLINICAL DATA:  Cholelithiasis. Right upper quadrant abdominal pain. EXAM: NUCLEAR MEDICINE HEPATOBILIARY IMAGING TECHNIQUE: Sequential images of the abdomen were obtained out to 60 minutes following intravenous administration of radiopharmaceutical. RADIOPHARMACEUTICALS:  5.49 mCi Tc-23m  Choletec  IV COMPARISON:  CT October 08, 2024 FINDINGS: Prompt uptake and biliary excretion of activity by the liver is seen. Gallbladder activity is visualized, consistent with patency of cystic duct. Biliary activity passes into small bowel, consistent with patent common bile duct. IMPRESSION: No scintigraphic evidence of acute cholecystitis. Electronically Signed   By: Reyes Holder M.D.   On: 10/09/2024 17:45   CT ABDOMEN PELVIS W CONTRAST Result Date: 10/08/2024 CLINICAL DATA:  Abdominal pain. EXAM: CT ABDOMEN AND PELVIS WITH CONTRAST TECHNIQUE: Multidetector CT imaging of the abdomen and pelvis was performed using the standard protocol following bolus administration of intravenous contrast. RADIATION DOSE REDUCTION: This exam was performed according to the departmental dose-optimization program which includes automated exposure control, adjustment of the mA and/or kV according to patient size and/or use of iterative reconstruction technique. CONTRAST:  75mL OMNIPAQUE  IOHEXOL  350 MG/ML SOLN COMPARISON:  CT abdomen pelvis dated 08/09/2022. FINDINGS: Lower chest: The visualized lung bases are clear. No intra-abdominal free air or free  fluid. Hepatobiliary: The liver is unremarkable. No biliary dilatation. Gallstone. No pericholecystic fluid or evidence of acute cholecystitis by CT. Pancreas: Unremarkable. No pancreatic ductal dilatation or surrounding inflammatory changes. Spleen: Normal in size without focal abnormality. Adrenals/Urinary Tract: The adrenal glands  unremarkable. There is mild left hydronephrosis. The right kidney is unremarkable. The visualized ureters and urinary bladder appear unremarkable. Stomach/Bowel: Postsurgical changes of gastric sleeve. There is no bowel obstruction or active inflammation. The appendix is normal. Vascular/Lymphatic: The abdominal aorta and IVC unremarkable. No portal venous gas. There is no adenopathy. Reproductive: The uterus appears mildly enlarged. No suspicious adnexal masses. Other: None Musculoskeletal: Degenerative changes of the spine. No acute osseous pathology. IMPRESSION: 1. Mild left hydronephrosis. 2. Cholelithiasis. 3. No bowel obstruction. Normal appendix. 4. Enlarged uterus. Electronically Signed   By: Vanetta Chou M.D.   On: 10/08/2024 20:14   VAS US  TRANSCRANIAL DOPPLER Result Date: 10/08/2024  Transcranial Doppler Patient Name:  CLAIR BARDWELL  Date of Exam:   10/07/2024 Medical Rec #: [id number]      Accession #:    [id number] Date of Birth: 1971-02-24     Patient Gender: F Patient Age:   71 years Exam Location:  [facility] Procedure:      VAS US  TRANSCRANIAL DOPPLER Referring Phys: FONDA SHARPS --------------------------------------------------------------------------------  Indications: Subarachnoid hemorrhage. 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  Limitations: patient movement and talking Comparison Study: Previous exam was on 10/04/2024 Performing Technologist: Ezzie Potters RVT, RDMS  Examination Guidelines: Maleko Greulich complete evaluation includes B-mode imaging, spectral Doppler, color Doppler, and power Doppler as needed  of all accessible portions of each vessel. Bilateral testing is considered an integral part of Amillion Scobee complete examination. Limited examinations for reoccurring indications may be performed as noted.  +----------+---------------+----------+-----------+------------------+ RIGHT TCD Right VM (cm/s)Depth (cm)Pulsatility     Comment       +----------+---------------+----------+-----------+------------------+ MCA             101         4.80      1.22                       +----------+---------------+----------+-----------+------------------+ ACA             -25                   1.25                       +----------+---------------+----------+-----------+------------------+ Term ICA        71                    1.14                       +----------+---------------+----------+-----------+------------------+ PCA P1                                        unable to insonate +----------+---------------+----------+-----------+------------------+ Opthalmic       16                    1.45                       +----------+---------------+----------+-----------+------------------+ ICA siphon      34                    1.21                       +----------+---------------+----------+-----------+------------------+ Vertebral       -  30                   1.08                       +----------+---------------+----------+-----------+------------------+ Distal ICA      34                    1.07                       +----------+---------------+----------+-----------+------------------+  +----------+--------------+----------+-----------+-------+ LEFT TCD  Left VM (cm/s)Depth (cm)PulsatilityComment +----------+--------------+----------+-----------+-------+ MCA             93         5.40      1.08            +----------+--------------+----------+-----------+-------+ ACA            -16                   0.90             +----------+--------------+----------+-----------+-------+ Term ICA        68                   0.89            +----------+--------------+----------+-----------+-------+ PCA P1          29                   0.82            +----------+--------------+----------+-----------+-------+ Opthalmic       15                   1.65            +----------+--------------+----------+-----------+-------+ ICA siphon      30                   1.28            +----------+--------------+----------+-----------+-------+ Vertebral      -43                   0.95            +----------+--------------+----------+-----------+-------+ Distal ICA      26                   1.15            +----------+--------------+----------+-----------+-------+  +------------+-------+------------------+             VM cm/s     Comment       +------------+-------+------------------+ Prox Basilar       unable to insonate +------------+-------+------------------+ Dist Basilar       unable to insonate +------------+-------+------------------+ +----------------------+----+ Right Lindegaard Ratio2.97 +----------------------+----+ +---------------------+----+ Left Lindegaard Ratio3.58 +---------------------+----+  Summary:  Elevated right middle cerebral artery mean flow velocities suggest mild vasospasm and elevate dleft MCA mean flow velocities suggest borderline mild vasospasm. basilar artery not insonated due to technical difficuluties from suboptimal suboccipital window. *See table(s) above for TCD measurements and observations.  Diagnosing physician: Eather Popp MD Electronically signed by Eather Popp MD on 10/08/2024 at 11:26:48 AM.    Final    US  Abdomen Complete Result Date: 10/07/2024 EXAM: COMPLETE ABDOMINAL ULTRASOUND TECHNIQUE: Real-time ultrasonography of the abdomen was performed. COMPARISON: CT abdomen and pelvis 08/09/2022. CLINICAL HISTORY: Abdominal pain. FINDINGS: LIVER: The liver  demonstrates normal echogenicity. No intrahepatic biliary ductal  dilatation. No mass. BILIARY SYSTEM: Cholelithiasis with Arif Amendola 2.8 cm stone in the gallbladder neck. No gallbladder wall thickening or edema. Murphy sign is positive. Common bile duct diameter measures 3 mm, which is normal. KIDNEYS: The right kidney measures 11 cm in length, and the left kidney measures 10.5 cm in length. Normal cortical echogenicity. No hydronephrosis. No calculus. No mass. PANCREAS: The pancreas is not well visualized due to body habitus, but it appears grossly unremarkable. SPLEEN: The spleen is not well visualized due to body habitus, but it appears grossly unremarkable. Spleen is within normal limits in size. VESSELS: The aorta is not well visualized due to body habitus, but it appears grossly unremarkable. Visualized portion of the inferior vena cava is normal. OTHER: No ascites. IMPRESSION: 1. Cholelithiasis with Tattiana Fakhouri 2.8 cm stone in the gallbladder neck and Kimesha Claxton positive sonographic Murphy sign, without gallbladder wall thickening or edema. Appearance suggests acute cholecystitis in the appropriate clinical setting. Electronically signed by: Elsie Gravely MD 10/07/2024 02:27 PM EST RP Workstation: [id number]   VAS US  TRANSCRANIAL DOPPLER Result Date: 10/05/2024  Transcranial Doppler Patient Name:  SHAYLON GILLEAN  Date of Exam:   10/04/2024 Medical Rec #: [id number]      Accession #:    [id number] Date of Birth: August 14, 1971     Patient Gender: F Patient Age:   28 years Exam Location:  [facility] Procedure:      VAS US  TRANSCRANIAL DOPPLER Referring Phys: FONDA SHARPS --------------------------------------------------------------------------------  Indications: Subarachnoid hemorrhage. 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 S/p balloon assisted coiling.  Limitations: VERY difficult and limited exam, patient continuously              vocalizing/talking despite attempts at redirection. Patient  unable              to turn head to obtain foramen window images. Limitations for diagnostic windows: Unable to insonate occipital window. Comparison Study: Previous exam 10/01/2024 Performing Technologist: Ezzie Potters RVT, RDMS  Examination Guidelines: Laporsche Hoeger complete evaluation includes B-mode imaging, spectral Doppler, color Doppler, and power Doppler as needed of all accessible portions of each vessel. Bilateral testing is considered an integral part of Ren Aspinall complete examination. Limited examinations for reoccurring indications may be performed as noted.  +----------+---------------+----------+-----------+-------------------+ RIGHT TCD Right VM (cm/s)Depth (cm)Pulsatility      Comment       +----------+---------------+----------+-----------+-------------------+ MCA             101         4.60      1.55                        +----------+---------------+----------+-----------+-------------------+ ACA                                           unable to visualize +----------+---------------+----------+-----------+-------------------+ Term ICA        86                    1.22                        +----------+---------------+----------+-----------+-------------------+ PCA P1  unable to visualize +----------+---------------+----------+-----------+-------------------+ Opthalmic       13                    1.56                        +----------+---------------+----------+-----------+-------------------+ ICA siphon      29                    0.98                        +----------+---------------+----------+-----------+-------------------+ Vertebral                                     unable to visualize +----------+---------------+----------+-----------+-------------------+ Distal ICA      31          1.43                                  +----------+---------------+----------+-----------+-------------------+   +----------+--------------+----------+-----------+-------------------+ LEFT TCD  Left VM (cm/s)Depth (cm)Pulsatility      Comment       +----------+--------------+----------+-----------+-------------------+ MCA             92         5.20      1.38                        +----------+--------------+----------+-----------+-------------------+ ACA                                          unable to visualize +----------+--------------+----------+-----------+-------------------+ Term ICA        57                   1.31                        +----------+--------------+----------+-----------+-------------------+ PCA P1                                       unable to visualize +----------+--------------+----------+-----------+-------------------+ Opthalmic       24                   1.59                        +----------+--------------+----------+-----------+-------------------+ ICA siphon      43                   1.00                        +----------+--------------+----------+-----------+-------------------+ Vertebral                                    unable to visualize +----------+--------------+----------+-----------+-------------------+ Distal ICA      23                   1.19                        +----------+--------------+----------+-----------+-------------------+  +------------+-------+-------------------+  VM cm/s      Comment       +------------+-------+-------------------+ Prox Basilar       unable to visualize +------------+-------+-------------------+ Dist Basilar       unable to visualize +------------+-------+-------------------+ +----------------------+----+ Right Lindegaard Ratio3.25 +----------------------+----+ +---------------------+----+ Left Lindegaard Ratio4.00 +---------------------+----+  Summary:  Elevated right middle cerebral artery mean flow velocities suggest mild vasospasm. Slightly elevated right  terminal ICA and left middle cerebral artery mean flow velocitie sof unclear significance. Poor suboccipital window limits evaluation of posterior circulation vessels.elevated pulsatilit7y indices suggest increased intracranial pressure likely *See table(s) above for TCD measurements and observations.  Diagnosing physician: Eather Popp MD Electronically signed by Eather Popp MD on 10/05/2024 at 11:28:45 AM.    Final    VAS US  TRANSCRANIAL DOPPLER Result Date: 10/03/2024  Transcranial Doppler Patient Name:  ALONNA BARTLING  Date of Exam:   10/01/2024 Medical Rec #: [id number]      Accession #:    [id number] Date of Birth: January 21, 1971     Patient Gender: F Patient Age:   43 years Exam Location:  [facility] Procedure:      VAS US  TRANSCRANIAL DOPPLER Referring Phys: FONDA SHARPS --------------------------------------------------------------------------------  Indications: Subarachnoid hemorrhage. 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 S/p balloon assisted coiling.  Performing Technologist: Ricka Sturdivant-Jones RDMS, RVT  Examination Guidelines: Moe Brier complete evaluation includes B-mode imaging, spectral Doppler, color Doppler, and power Doppler as needed of all accessible portions of each vessel. Bilateral testing is considered an integral part of Kelsy Polack complete examination. Limited examinations for reoccurring indications may be performed as noted.  +----------+---------------+----------+-----------+-------+ RIGHT TCD Right VM (cm/s)Depth (cm)PulsatilityComment +----------+---------------+----------+-----------+-------+ MCA             96          1.14      1.15            +----------+---------------+----------+-----------+-------+ ACA            -101         6.40      0.87            +----------+---------------+----------+-----------+-------+ Term ICA        67                    0.86            +----------+---------------+----------+-----------+-------+  PCA P1          22                    0.79            +----------+---------------+----------+-----------+-------+ Opthalmic       21                    1.30            +----------+---------------+----------+-----------+-------+ ICA siphon      26                    0.93            +----------+---------------+----------+-----------+-------+ Vertebral       -29                   0.93            +----------+---------------+----------+-----------+-------+ Distal ICA      27                    0.93            +----------+---------------+----------+-----------+-------+  +----------+--------------+----------+-----------+-------+  LEFT TCD  Left VM (cm/s)Depth (cm)PulsatilityComment +----------+--------------+----------+-----------+-------+ MCA            101         4.90      1.04            +----------+--------------+----------+-----------+-------+ ACA            -40                   0.94            +----------+--------------+----------+-----------+-------+ Term ICA        46                   1.20            +----------+--------------+----------+-----------+-------+ PCA P1          38                   1.09            +----------+--------------+----------+-----------+-------+ Opthalmic       26                   1.45            +----------+--------------+----------+-----------+-------+ ICA siphon      46                   0.98            +----------+--------------+----------+-----------+-------+ Vertebral      -35                   0.96            +----------+--------------+----------+-----------+-------+ Distal ICA      34                   1.03            +----------+--------------+----------+-----------+-------+  +------------+-------+-------------+             VM cm/s   Comment    +------------+-------+-------------+ Prox Basilar  -53                +------------+-------+-------------+ Dist Basilar       not insonated  +------------+-------+-------------+ +----------------------+---+ Right Lindegaard Ratio3.5 +----------------------+---+ +---------------------+---+ Left Lindegaard Ratio2.9 +---------------------+---+  Summary:  Elevated mean flow velocities suggestive of mild right anterior and left middle cerebral arery vasospasm and borderline right mildd cerebral artery vasospasm. *See table(s) above for TCD measurements and observations.  Diagnosing physician: Eather Popp MD Electronically signed by Eather Popp MD on 10/03/2024 at 8:06:01 AM.    Final    VAS US  TRANSCRANIAL DOPPLER Result Date: 10/01/2024  Transcranial Doppler Patient Name:  JAZMAN REUTER  Date of Exam:   09/30/2024 Medical Rec #: [id number]      Accession #:    [id number] Date of Birth: [birth year]     Patient Gender: F Patient Age:   18 years Exam Location:  [facility] Procedure:      VAS US  TRANSCRANIAL DOPPLER Referring Phys: FONDA SHARPS --------------------------------------------------------------------------------  Indications: Subarachnoid hemorrhage. 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  Limitations: Movement, snoring, Doppler interference Comparison Study: Prior TCD 09/29/2024 Performing Technologist: Alberta Lis RVS  Examination Guidelines: Josiephine Simao complete evaluation includes B-mode imaging, spectral Doppler, color Doppler, and power Doppler as needed of all accessible portions of each vessel. Bilateral testing is considered an integral part of Reinhart Saulters complete examination. Limited examinations  for reoccurring indications may be performed as noted.  +----------+---------------+----------+-----------+------------------+ RIGHT TCD Right VM (cm/s)Depth (cm)Pulsatility     Comment       +----------+---------------+----------+-----------+------------------+ MCA             67                    1.04                        +----------+---------------+----------+-----------+------------------+ ACA             -28                   1.08                       +----------+---------------+----------+-----------+------------------+ Term ICA        48                    1.03                       +----------+---------------+----------+-----------+------------------+ PCA P1          -31                   1.17                       +----------+---------------+----------+-----------+------------------+ Opthalmic       19                    1.28                       +----------+---------------+----------+-----------+------------------+ ICA siphon                                    unable to insonate +----------+---------------+----------+-----------+------------------+ Vertebral       -38                   0.97                       +----------+---------------+----------+-----------+------------------+ Distal ICA      -18                   1.14                       +----------+---------------+----------+-----------+------------------+  +----------+--------------+----------+-----------+------------------+ LEFT TCD  Left VM (cm/s)Depth (cm)Pulsatility     Comment       +----------+--------------+----------+-----------+------------------+ MCA             80                   1.16                       +----------+--------------+----------+-----------+------------------+ ACA            -23                   1.59                       +----------+--------------+----------+-----------+------------------+ Term ICA        38  1.13                       +----------+--------------+----------+-----------+------------------+ PCA P1          28                   0.97                       +----------+--------------+----------+-----------+------------------+ Opthalmic       22                   1.89                        +----------+--------------+----------+-----------+------------------+ ICA siphon                                   unable to insonate +----------+--------------+----------+-----------+------------------+ Vertebral      -41                   0.72                       +----------+--------------+----------+-----------+------------------+ Distal ICA     -31                   1.24                       +----------+--------------+----------+-----------+------------------+  +------------+-------+-------+             VM cm/sComment +------------+-------+-------+ Prox Basilar  -41          +------------+-------+-------+ +----------------------+---+ Right Lindegaard Ratio3.7 +----------------------+---+ +---------------------+----+ Left Lindegaard Ratio2.58 +---------------------+----+  Summary: This was Vela Render normal transcranial Doppler study, with normal flow direction and velocity of all identified vessels of the anterior and posterior circulations, with no evidence of stenosis, vasospasm or occlusion. There was no evidence of intracranial disease. *See table(s) above for TCD measurements and observations.  Diagnosing physician: Eather Popp MD Electronically signed by Eather Popp MD on 10/01/2024 at 10:27:03 AM.    Final    VAS US  TRANSCRANIAL DOPPLER Result Date: 10/01/2024  Transcranial Doppler Patient Name:  UNITA DETAMORE  Date of Exam:   09/29/2024 Medical Rec #: [id number]      Accession #:    [id number] Date of Birth: [birth year]     Patient Gender: F Patient Age:   21 years Exam Location:  [facility] Procedure:      VAS US  TRANSCRANIAL DOPPLER Referring Phys: CHI ELLISON --------------------------------------------------------------------------------  Indications: Subarachnoid hemorrhage. 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  Limitations: Movement, snoring, Doppler interference, neck habitus Comparison Study: Prior TCD  09/27/24 Performing Technologist: Alberta Lis RVS  Examination Guidelines: Zyire Eidson complete evaluation includes B-mode imaging, spectral Doppler, color Doppler, and power Doppler as needed of all accessible portions of each vessel. Bilateral testing is considered an integral part of Saanvika Vazques complete examination. Limited examinations for reoccurring indications may be performed as noted.  +----------+---------------+----------+-----------+------------------+ RIGHT TCD Right VM (cm/s)Depth (cm)Pulsatility     Comment       +----------+---------------+----------+-----------+------------------+ MCA             54                    0.97                       +----------+---------------+----------+-----------+------------------+  ACA             -27                   1.18                       +----------+---------------+----------+-----------+------------------+ Term ICA        46                    1.58                       +----------+---------------+----------+-----------+------------------+ PCA P1          24                    1.21                       +----------+---------------+----------+-----------+------------------+ Opthalmic       14                    1.89                       +----------+---------------+----------+-----------+------------------+ ICA siphon                                    unable to insonate +----------+---------------+----------+-----------+------------------+ Vertebral                                     unable to insonate +----------+---------------+----------+-----------+------------------+ Distal ICA                                    unable to insonate +----------+---------------+----------+-----------+------------------+  +----------+--------------+----------+-----------+------------------+ LEFT TCD  Left VM (cm/s)Depth (cm)Pulsatility     Comment       +----------+--------------+----------+-----------+------------------+ MCA              55                   1.17                       +----------+--------------+----------+-----------+------------------+ ACA            -37                   1.36                       +----------+--------------+----------+-----------+------------------+ Term ICA        57                   1.31                       +----------+--------------+----------+-----------+------------------+ PCA P1          28                   1.53                       +----------+--------------+----------+-----------+------------------+ Opthalmic       24  1.80                       +----------+--------------+----------+-----------+------------------+ ICA siphon      21                    143                       +----------+--------------+----------+-----------+------------------+ Vertebral                                    unable to insonate +----------+--------------+----------+-----------+------------------+ Distal ICA      26                   1.01                       +----------+--------------+----------+-----------+------------------+  +------------+-------+------------------+             VM cm/s     Comment       +------------+-------+------------------+ Prox Basilar       unable to insonate +------------+-------+------------------+ +---------------------+----+ Left Lindegaard Ratio2.12 +---------------------+----+  Summary:  Normal mean flow velocities in anterior circulation vessels without evidence of vasospasm. Poor suboccipital window limits evaluation of posterior circulation vessels. *See table(s) above for TCD measurements and observations.  Diagnosing physician: Eather Popp MD Electronically signed by Eather Popp MD on 10/01/2024 at 10:26:03 AM.    Final    VAS US  TRANSCRANIAL DOPPLER Result Date: 10/01/2024  Transcranial Doppler Patient Name:  WENDELL FIEBIG  Date of Exam:   09/27/2024 Medical Rec #: [id number]      Accession #:     [id number] Date of Birth: 13-Jul-1971     Patient Gender: F Patient Age:   59 years Exam Location:  [facility] Procedure:      VAS US  TRANSCRANIAL DOPPLER Referring Phys: FONDA SHARPS --------------------------------------------------------------------------------  Indications: Subarachnoid hemorrhage. Limitations: patient positioning, patient vocalization, patient body habitus Comparison Study: No prior studies. Performing Technologist: Cordella Collet RVT  Examination Guidelines: Mariselda Badalamenti complete evaluation includes B-mode imaging, spectral Doppler, color Doppler, and power Doppler as needed of all accessible portions of each vessel. Bilateral testing is considered an integral part of Izell Labat complete examination. Limited examinations for reoccurring indications may be performed as noted.  +----------+---------------+----------+-----------+------------------+ RIGHT TCD Right VM (cm/s)Depth (cm)Pulsatility     Comment       +----------+---------------+----------+-----------+------------------+ MCA             86                     1.3                       +----------+---------------+----------+-----------+------------------+ ACA                                           Unable to insonate +----------+---------------+----------+-----------+------------------+ Term ICA        47                    1.12                       +----------+---------------+----------+-----------+------------------+ PCA P1  Unable to insonate +----------+---------------+----------+-----------+------------------+ Opthalmic       23                    1.49                       +----------+---------------+----------+-----------+------------------+ ICA siphon      19                    1.28                       +----------+---------------+----------+-----------+------------------+ Vertebral       -17                   1.49                        +----------+---------------+----------+-----------+------------------+  +----------+--------------+----------+-----------+------------------+ LEFT TCD  Left VM (cm/s)Depth (cm)Pulsatility     Comment       +----------+--------------+----------+-----------+------------------+ MCA             74                   1.06                       +----------+--------------+----------+-----------+------------------+ ACA            -33                   1.43                       +----------+--------------+----------+-----------+------------------+ Term ICA       -53                   1.39                       +----------+--------------+----------+-----------+------------------+ PCA P1                                       Unable to insonate +----------+--------------+----------+-----------+------------------+ Opthalmic       13                   1.28                       +----------+--------------+----------+-----------+------------------+ ICA siphon      23                   2.04                       +----------+--------------+----------+-----------+------------------+ Vertebral      -25                   1.04                       +----------+--------------+----------+-----------+------------------+  +------------+-------+-------+             VM cm/sComment +------------+-------+-------+ Prox Basilar  -15   0.86   +------------+-------+-------+ Summary:  Normal mean flow velocities in majority of identified vessels except low velocities in posterior circulation likley due to suboptimal waveforms from poor suboccipital window likely.No evidence of vasospasm noted *See table(s) above for TCD measurements and observations.  Diagnosing physician: Eather Popp MD Electronically signed by Eather Popp MD on 10/01/2024 at 10:24:06 AM.    Final    IR ANGIO INTRA EXTRACRAN SEL INTERNAL CAROTID UNI L MOD SED Result Date: 09/30/2024 PROCEDURE: Balloon assisted coil  embolization of ruptured left internal carotid artery aneurysm HISTORY: Ruptured left internal carotid artery aneurysm ACCESS: Right femoral Skye Rodarte right radial arterial monitoring line was placed in addition to the femoral access used for the procedure ANESTHESIA/SEDATION: General MEDICATIONS: HEPARIN : 5,000 units 189 seconds An additional 1000 units were administered CONTRAST:  80 mL Omnipaque  300 FLUOROSCOPY: 14.7 minutes Nine hundred ninety-eight mGy reference air Kerma PROCEDURE: The right radial artery was punctured using ultrasound guidance with direct visualization of the needle entering the radial artery. Radial artery monitoring catheter was placed and connected to Kasiah Manka arterial pressure monitoring line. Femoral access was obtained with ultrasound using direct visualization of the needle puncture into the artery. Raymond Bhardwaj 80 cm neuron max sheath was placed. The neuron max was advanced over Ashlee Gordon penumbra select Berenstein into the common carotid artery. 5 mg of verapamil  were administered intra-arterially. Diagnostic arteriography was performed of the left carotid bifurcation. The catheter was advanced just within the internal carotid artery for study of the cerebral circulation in order to evaluate the aneurysm, determine circle-of-Willis collaterals and plan the procedure. Fallan Mccarey 0.070 Sofia intermediate catheter was advanced along with Mackayla Mullins 45 degree XT 17 microcatheter and Jamaria Amborn 4 x 15 scepter C balloon. The Flint was positioned within the cavernous segment. The balloon was positioned across the neck of the aneurysm. The microcatheter was placed in the aneurysm. The aneurysm was then coiled with 7 x 20 XL soft, 6 x 20 XL soft, 5 x 10 XL soft, 4 x 8 XL soft and 4.5 x 10 tetra coils. Maisey Deandrade follow-up angiogram was then performed which demonstrated occlusion of the aneurysm and normal flow in the internal carotid artery, anterior and middle cerebral arteries. Hemostasis was obtained with Angio-Seal. FINDINGS: Left common carotid: The  bifurcation is normal. There is tortuosity of the internal carotid artery. Left internal carotid intracranial: There is Solangel Mcmanaway 7 mm aneurysm arising from the internal carotid artery just distal to the ophthalmic artery directed superiorly. There is Krishna Dancel large posterior communicating artery but the P1 segment is patent. The anterior and middle cerebral artery territories are normal. There is Dezire Turk patent anterior communicating artery. Left internal carotid post embolization: The aneurysm is occluded. There is Noha Karasik tag of 1 of the coils laying on the medial aspect of the internal carotid artery without any pulsatile movement. There is normal flow in the internal carotid artery, anterior cerebral artery and middle cerebral artery territories. Normal flow in the posterior cerebral artery. IMPRESSION: Balloon assisted coil embolization of ruptured left internal carotid artery aneurysm from femoral access, placement of right radial Adren Dollins line with ultrasound guidance. Codes: 23062, [id number], [id number], [id number], [id number], [id number] Electronically Signed   By: Nancyann Burns M.D.   On: 09/30/2024 10:37   IR Angiogram Follow Up Study Result Date: 09/30/2024 PROCEDURE: Balloon assisted coil embolization of ruptured left internal carotid artery aneurysm HISTORY: Ruptured left internal carotid artery aneurysm ACCESS: Right femoral Rivers Gassmann right radial arterial monitoring line was placed in addition to the femoral access used for the procedure ANESTHESIA/SEDATION: General MEDICATIONS: HEPARIN : 5,000 units 189 seconds An additional 1000 units were administered CONTRAST:  80 mL Omnipaque  300 FLUOROSCOPY: 14.7 minutes Nine hundred ninety-eight mGy reference air Kerma PROCEDURE: The right radial artery was punctured using ultrasound guidance with direct visualization  of the needle entering the radial artery. Radial artery monitoring catheter was placed and connected to Shaurya Rawdon arterial pressure monitoring line. Femoral access was obtained with ultrasound using direct  visualization of the needle puncture into the artery. Mya Suell 80 cm neuron max sheath was placed. The neuron max was advanced over Destyne Goodreau penumbra select Berenstein into the common carotid artery. 5 mg of verapamil  were administered intra-arterially. Diagnostic arteriography was performed of the left carotid bifurcation. The catheter was advanced just within the internal carotid artery for study of the cerebral circulation in order to evaluate the aneurysm, determine circle-of-Willis collaterals and plan the procedure. Ragena Fiola 0.070 Sofia intermediate catheter was advanced along with Mickey Hebel 45 degree XT 17 microcatheter and Tyyne Cliett 4 x 15 scepter C balloon. The Flint was positioned within the cavernous segment. The balloon was positioned across the neck of the aneurysm. The microcatheter was placed in the aneurysm. The aneurysm was then coiled with 7 x 20 XL soft, 6 x 20 XL soft, 5 x 10 XL soft, 4 x 8 XL soft and 4.5 x 10 tetra coils. Alessandria Henken follow-up angiogram was then performed which demonstrated occlusion of the aneurysm and normal flow in the internal carotid artery, anterior and middle cerebral arteries. Hemostasis was obtained with Angio-Seal. FINDINGS: Left common carotid: The bifurcation is normal. There is tortuosity of the internal carotid artery. Left internal carotid intracranial: There is Zhania Shaheen 7 mm aneurysm arising from the internal carotid artery just distal to the ophthalmic artery directed superiorly. There is Sherin Murdoch large posterior communicating artery but the P1 segment is patent. The anterior and middle cerebral artery territories are normal. There is Takeira Yanes patent anterior communicating artery. Left internal carotid post embolization: The aneurysm is occluded. There is Kawehi Hostetter tag of 1 of the coils laying on the medial aspect of the internal carotid artery without any pulsatile movement. There is normal flow in the internal carotid artery, anterior cerebral artery and middle cerebral artery territories. Normal flow in the posterior cerebral  artery. 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 Codes: 23062, [id number], [id number], [id number], [id number], [id number],23062 Electronically Signed   By: Nancyann Burns M.D.   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 Radial artery monitoring catheter was placed and connected to Jailyn Leeson arterial pressure monitoring line. Femoral access was obtained with ultrasound using direct visualization of the needle puncture into the artery. Quandarius Nill 80 cm neuron max sheath was placed. The neuron max was advanced over Ramyah Pankowski penumbra select Berenstein into the common carotid artery. 5 mg of verapamil  were administered intra-arterially. Diagnostic arteriography was performed of the left carotid bifurcation. The catheter was advanced just within the internal carotid artery for study of the cerebral circulation in order to evaluate the aneurysm, determine circle-of-Willis collaterals and plan the procedure. Trinaty Bundrick 0.070 Sofia intermediate catheter was advanced along with Tykee Heideman 45 degree XT 17 microcatheter  and Jannelly Bergren 4 x 15 scepter C balloon. The Flint was positioned within the cavernous segment. The balloon was positioned across the neck of the aneurysm. The microcatheter was  placed in the aneurysm. The aneurysm was then coiled with 7 x 20 XL soft, 6 x 20 XL soft, 5 x 10 XL soft, 4 x 8 XL soft and 4.5 x 10 tetra coils. Jacobi Ryant follow-up angiogram was then performed which demonstrated occlusion of the aneurysm and normal flow in the internal carotid artery, anterior and middle cerebral arteries. Hemostasis was obtained with Angio-Seal. FINDINGS: Left common carotid: The bifurcation is normal. There is tortuosity of the internal carotid artery. Left internal carotid intracranial: There is Kadiatou Oplinger 7 mm aneurysm arising from the internal carotid artery just distal to the ophthalmic artery directed superiorly. There is Deanne Bedgood large posterior communicating artery but the P1 segment is patent. The anterior and middle cerebral artery territories are normal. There is Estle Sabella patent anterior communicating artery. Left internal carotid post embolization: The aneurysm is occluded. There is Guinn Delarosa tag of 1 of the coils laying on the medial aspect of the internal carotid artery without any pulsatile movement. There is normal flow in the internal carotid artery, anterior cerebral artery and middle cerebral artery territories. Normal flow in the posterior cerebral artery. 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 Codes: 23062, [id number], [id number], [id number], [id number], [id number],23062 Electronically Signed   By: Nancyann Burns M.D.   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 Radial artery monitoring catheter was placed and connected to Marsella Suman arterial pressure monitoring line. Femoral access was obtained with ultrasound using direct visualization of the needle puncture into the artery. Joleah Kosak 80 cm neuron max sheath was placed. The neuron max was advanced over Airlie Blumenberg penumbra select Berenstein into the common carotid artery. 5 mg of verapamil  were administered intra-arterially. Diagnostic arteriography was performed of the left carotid bifurcation. The catheter was advanced just within the internal carotid artery for study of the cerebral circulation in order to evaluate the aneurysm, determine circle-of-Willis collaterals and plan the procedure. Kadijah Shamoon 0.070 Sofia intermediate catheter was advanced along with Reggie Welge 45 degree XT 17 microcatheter and Jevin Camino 4 x 15 scepter C balloon. The Flint was positioned within the cavernous segment. The balloon was positioned across the neck of the aneurysm. The microcatheter was placed in the aneurysm. The aneurysm was then coiled with 7 x 20 XL soft, 6 x 20 XL soft, 5 x 10 XL soft, 4 x 8 XL soft and 4.5 x 10 tetra coils. Maritza Hosterman follow-up angiogram was then performed which demonstrated occlusion of the aneurysm and normal flow in the internal carotid artery, anterior and middle cerebral arteries. Hemostasis was obtained with Angio-Seal. FINDINGS: Left common carotid: The bifurcation is normal. There is tortuosity of the internal carotid artery. Left internal carotid intracranial: There is Ellanie Oppedisano 7 mm  aneurysm arising from the internal carotid artery just distal to the ophthalmic artery directed superiorly. There is Colm Lyford large posterior communicating artery but the P1 segment is patent. The anterior and middle cerebral artery territories are normal. There is Timonthy Hovater  patent anterior communicating artery. Left internal carotid post embolization: The aneurysm is occluded. There is Alvin Rubano tag of 1 of the coils laying on the medial aspect of the internal carotid artery without any pulsatile movement. There is normal flow in the internal carotid artery, anterior cerebral artery and middle cerebral artery territories. Normal flow in the posterior cerebral artery. 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 Codes: 23062, [id number], [id number], [id number], [id number], [id number] Electronically Signed   By: Nancyann Burns M.D.   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 Radial artery monitoring catheter was placed and connected to Lana Flaim arterial pressure monitoring line. Femoral access was obtained with ultrasound using direct visualization of the needle puncture into the artery. Oasis Goehring 80 cm neuron max sheath was placed. The neuron max was advanced over Tirth Cothron penumbra select Berenstein into the common carotid artery. 5 mg of verapamil  were administered intra-arterially. Diagnostic arteriography was performed of the left carotid bifurcation. The catheter was advanced just within the internal carotid artery for study of  the cerebral circulation in order to evaluate the aneurysm, determine circle-of-Willis collaterals and plan the procedure. Ayan Yankey 0.070 Sofia intermediate catheter was advanced along with Donavan Kerlin 45 degree XT 17 microcatheter and Jaheem Hedgepath 4 x 15 scepter C balloon. The Flint was positioned within the cavernous segment. The balloon was positioned across the neck of the aneurysm. The microcatheter was placed in the aneurysm. The aneurysm was then coiled with 7 x 20 XL soft, 6 x 20 XL soft, 5 x 10 XL soft, 4 x 8 XL soft and 4.5 x 10 tetra coils. Kianni Lheureux follow-up angiogram was then performed which demonstrated occlusion of the aneurysm and normal flow in the internal carotid artery, anterior and middle cerebral arteries. Hemostasis was obtained with Angio-Seal. FINDINGS: Left common carotid: The bifurcation is normal. There is tortuosity of the internal carotid artery. Left internal carotid intracranial: There is Danetta Prom 7 mm aneurysm arising from the internal carotid artery just distal to the ophthalmic artery directed superiorly. There is Chevelle Coulson large posterior communicating artery but the P1 segment is patent. The anterior and middle cerebral artery territories are normal. There is Emilyann Banka patent anterior communicating artery. Left internal carotid post embolization: The aneurysm is occluded. There is Ichelle Harral tag of 1 of the coils laying on the medial aspect of the internal carotid artery without any pulsatile movement. There is normal flow in the internal carotid artery, anterior cerebral artery and middle cerebral artery territories. Normal flow in the posterior cerebral artery. 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 Codes: 23062, [id number], [id number], [id number], [id number], [id number],23062 Electronically Signed   By: Nancyann Burns M.D.   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 Radial artery monitoring catheter was placed and connected to Hortencia Martire arterial pressure monitoring line. Femoral access was obtained with ultrasound using direct visualization of the needle puncture into the artery. Arla Boutwell 80 cm neuron max sheath was placed. The neuron max was advanced over Helma Argyle penumbra select Berenstein into the common carotid artery. 5 mg of verapamil  were administered intra-arterially. Diagnostic arteriography was performed of the left carotid bifurcation. The catheter was advanced just within the internal carotid artery for study of the cerebral circulation in order to evaluate the aneurysm, determine circle-of-Willis collaterals and plan the procedure. Carlissa Pesola 0.070 Sofia intermediate catheter was advanced along with Ashanti Littles 45 degree XT 17 microcatheter and Frayda Egley 4 x 15 scepter C balloon. The Flint was positioned within the cavernous segment. The balloon was positioned across the neck of the aneurysm. The microcatheter was placed in the aneurysm. The aneurysm was then coiled with 7 x 20 XL soft, 6 x 20 XL soft, 5 x 10 XL soft, 4 x 8 XL soft and 4.5 x 10 tetra coils. Sammie Denner follow-up angiogram was then performed which demonstrated occlusion of the aneurysm and normal flow in the internal carotid artery, anterior and middle cerebral arteries. Hemostasis was obtained with Angio-Seal. FINDINGS: Left common carotid: The  bifurcation is normal. There is tortuosity of the internal carotid artery. Left internal carotid intracranial: There is Jancie Kercher 7 mm aneurysm arising from the internal carotid artery just distal to the ophthalmic artery directed superiorly. There is Azzan Butler large posterior communicating artery but the P1 segment is patent. The anterior and middle cerebral artery territories are normal. There is Kenney Going patent anterior communicating artery. Left internal carotid post embolization: The aneurysm is occluded. There is Andilyn Bettcher tag of 1 of the coils laying on the medial aspect of the internal carotid artery without any pulsatile movement. There is normal flow in the internal carotid artery, anterior cerebral artery and middle cerebral artery territories. Normal flow in the posterior cerebral artery. 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 Codes: 23062, [id number], [id number], [id number], [id number], 63779,23062 Electronically Signed   By: Nancyann Burns M.D.   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 Radial artery monitoring catheter was placed and connected to Lakeithia Rasor arterial pressure monitoring line. Femoral access was obtained with ultrasound using direct  visualization of the needle puncture into the artery. Ortencia Askari 80 cm neuron max sheath was placed. The neuron max was advanced over Carmel Waddington penumbra select Berenstein into the common carotid artery. 5 mg of verapamil  were administered intra-arterially. Diagnostic arteriography was performed of the left carotid bifurcation. The catheter was advanced just within the internal carotid artery for study of the cerebral circulation in order to evaluate the aneurysm, determine circle-of-Willis collaterals and plan the procedure. Rahshawn Remo 0.070 Sofia intermediate catheter was advanced along with Verania Salberg 45 degree XT 17 microcatheter and Dan Scearce 4 x 15 scepter C balloon. The Flint was positioned within the cavernous segment. The balloon was positioned across the neck of the aneurysm. The microcatheter was placed in the aneurysm. The aneurysm was then coiled with 7 x 20 XL soft, 6 x 20 XL soft, 5 x 10 XL soft, 4 x 8 XL soft and 4.5 x 10 tetra coils. Aarya Quebedeaux follow-up angiogram was then performed which demonstrated occlusion of the aneurysm and normal flow in the internal carotid artery, anterior and middle cerebral arteries. Hemostasis was obtained with Angio-Seal. FINDINGS: Left common carotid: The bifurcation is normal. There is tortuosity of the internal carotid artery. Left internal carotid intracranial: There is Naleigha Raimondi 7 mm aneurysm arising from the internal carotid artery just distal to the ophthalmic artery directed superiorly. There is Avi Archuleta large posterior communicating artery but the P1 segment is patent. The anterior and middle cerebral artery territories are normal. There is Amarri Satterly patent anterior communicating artery. Left internal carotid post embolization: The aneurysm is occluded. There is Mert Dietrick tag of 1 of the coils laying on the medial aspect of the internal carotid artery without any pulsatile movement. There is normal flow in the internal carotid artery, anterior cerebral artery and middle cerebral artery territories. Normal flow in the posterior cerebral  artery. 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 Codes: 23062, [id number], [id number], [id number], [id number], 63779,23062 Electronically Signed   By: Nancyann Burns M.D.   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 Radial artery monitoring catheter was placed and connected to Nanako Stopher arterial pressure monitoring line. Femoral access was obtained with ultrasound using direct visualization of the needle puncture into the artery. Jermane Brayboy 80 cm neuron max sheath was placed. The neuron max was advanced over Wade Asebedo penumbra select Berenstein into the common carotid artery. 5 mg of verapamil  were administered intra-arterially. Diagnostic arteriography was performed of the left carotid bifurcation. The catheter was advanced just within the internal carotid artery for study of the cerebral circulation in order to evaluate the aneurysm, determine circle-of-Willis collaterals and plan the procedure. Eean Buss 0.070 Sofia intermediate catheter was advanced along with Towanna Avery 45 degree XT 17 microcatheter  and Stone Spirito 4 x 15 scepter C balloon. The Flint was positioned within the cavernous segment. The balloon was positioned across the neck of the aneurysm. The microcatheter was  placed in the aneurysm. The aneurysm was then coiled with 7 x 20 XL soft, 6 x 20 XL soft, 5 x 10 XL soft, 4 x 8 XL soft and 4.5 x 10 tetra coils. Janaa Acero follow-up angiogram was then performed which demonstrated occlusion of the aneurysm and normal flow in the internal carotid artery, anterior and middle cerebral arteries. Hemostasis was obtained with Angio-Seal. FINDINGS: Left common carotid: The bifurcation is normal. There is tortuosity of the internal carotid artery. Left internal carotid intracranial: There is Eilidh Marcano 7 mm aneurysm arising from the internal carotid artery just distal to the ophthalmic artery directed superiorly. There is Thom Ollinger large posterior communicating artery but the P1 segment is patent. The anterior and middle cerebral artery territories are normal. There is Coraima Tibbs patent anterior communicating artery. Left internal carotid post embolization: The aneurysm is occluded. There is Mayreli Alden tag of 1 of the coils laying on the medial aspect of the internal carotid artery without any pulsatile movement. There is normal flow in the internal carotid artery, anterior cerebral artery and middle cerebral artery territories. Normal flow in the posterior cerebral artery. 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 Codes: 23062, [id number], [id number], [id number], [id number], 63779,23062 Electronically Signed   By: Nancyann Burns M.D.   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 Radial artery monitoring catheter was placed and connected to Daritza Brees arterial pressure monitoring line. Femoral access was obtained with ultrasound using direct visualization of the needle puncture into the artery. Avian Greenawalt 80 cm neuron max sheath was placed. The neuron max was advanced over Zelma Snead penumbra select Berenstein into the common carotid artery. 5 mg of verapamil  were administered intra-arterially. Diagnostic arteriography was performed of the left carotid bifurcation. The catheter was advanced just within the internal carotid artery for study of the cerebral circulation in order to evaluate the aneurysm, determine circle-of-Willis collaterals and plan the procedure. Cruz Bong 0.070 Sofia intermediate catheter was advanced along with Rosangelica Pevehouse 45 degree XT 17 microcatheter and Muhsin Doris 4 x 15 scepter C balloon. The Flint was positioned within the cavernous segment. The balloon was positioned across the neck of the aneurysm. The microcatheter was placed in the aneurysm. The aneurysm was then coiled with 7 x 20 XL soft, 6 x 20 XL soft, 5 x 10 XL soft, 4 x 8 XL soft and 4.5 x 10 tetra coils. Hilda Wexler follow-up angiogram was then performed which demonstrated occlusion of the aneurysm and normal flow in the internal carotid artery, anterior and middle cerebral arteries. Hemostasis was obtained with Angio-Seal. FINDINGS: Left common carotid: The bifurcation is normal. There is tortuosity of the internal carotid artery. Left internal carotid intracranial: There is Lauretta Sallas 7 mm aneurysm arising from  the internal carotid artery just distal to the ophthalmic artery directed superiorly. There is Celita Aron large posterior communicating artery but the P1 segment is patent. The anterior and middle cerebral artery territories are normal. There is Dontarius Sheley  patent anterior communicating artery. Left internal carotid post embolization: The aneurysm is occluded. There is Charvis Lightner tag of 1 of the coils laying on the medial aspect of the internal carotid artery without any pulsatile movement. There is normal flow in the internal carotid artery, anterior cerebral artery and middle cerebral artery territories. Normal flow in the posterior cerebral artery. 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 Codes: 23062, [id number], [id number], [id number], [id number], [id number],23062 Electronically Signed   By: Nancyann Burns M.D.   On: 09/30/2024 10:37   CT HEAD WO CONTRAST ( ) Result Date: 09/30/2024 EXAM: CT HEAD WITHOUT CONTRAST 09/30/2024 04:14:00 AM TECHNIQUE: CT of the head was performed without the administration of intravenous contrast. Automated exposure control, iterative reconstruction, and/or weight based adjustment of the mA/kV was utilized to reduce the radiation dose to as low as reasonably achievable. COMPARISON: CT of the head dated 09/28/2024. CLINICAL HISTORY: Stroke, follow up; SAH. FINDINGS: BRAIN AND VENTRICLES: Subarachnoid hemorrhage [facility]) is again noted, primarily within the interhemispheric fissure. There has been mild interval improvement of the [facility]. 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 There is spray artifact from the embolization coils. There is no convincing evidence of cerebral infarction. No hydrocephalus. No extra-axial collection. No mass effect or midline shift. ORBITS: No acute abnormality. SINUSES: No acute abnormality. SOFT TISSUES AND SKULL: No acute soft tissue abnormality. No skull fracture. IMPRESSION: 1. Mild interval improvement of subarachnoid hemorrhage, primarily within the interhemispheric fissure. 2. 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 3. No convincing evidence of cerebral infarction. Electronically signed by: Evalene Coho MD 09/30/2024 04:35 AM EST RP Workstation: [id number]   CT HEAD WO CONTRAST ( ) Result Date: 09/28/2024 EXAM: CT HEAD WITHOUT CONTRAST 09/28/2024 04:06:53 AM TECHNIQUE: CT of the head was performed without the administration of intravenous contrast. Automated exposure control, iterative reconstruction, and/or weight based adjustment of the mA/kV was utilized to reduce the radiation dose to as low as reasonably achievable. COMPARISON: CT of the head dated 09/27/2024. CLINICAL HISTORY: Subarachnoid hemorrhage [facility]). FINDINGS: LIMITATIONS/ARTIFACTS: The study is significantly degraded by patient motion. There is spray artifact from the embolization coils. BRAIN AND VENTRICLES: Extensive subarachnoid hemorrhage, most pronounced in the interhemispheric fissure and basilar cisterns. 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 Preservation of the gray-white matter junctions. No definite evidence of cerebral infarction. No mass effect or midline shift. No hydrocephalus. Disconjugate gaze is again demonstrated. ORBITS: No acute abnormality. SINUSES: No acute abnormality. SOFT TISSUES AND SKULL: No acute soft tissue abnormality. No skull fracture. IMPRESSION: 1. 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 2. Extensive subarachnoid hemorrhage, most pronounced in the interhemispheric fissure and basilar cisterns, similar to prior exam. 3. No definite evidence of cerebral infarction, mass effect, midline shift, or hydrocephalus. 4. Study significantly degraded by patient motion and spray artifact from the embolization coils. Electronically signed by: Evalene Coho MD  09/28/2024 04:23 AM EST RP Workstation: [id number]   DG Abd Portable 1V Result Date: 09/27/2024 EXAM: 1 VIEW XRAY OF THE ABDOMEN 09/27/2024 05:39:00 PM COMPARISON: None available. CLINICAL HISTORY: Encounter for imaging study to confirm orogastric (OG) tube placement. FINDINGS: LINES, TUBES AND DEVICES: OG tube tip is in the mid to distal stomach. BOWEL: Nonobstructive bowel gas pattern. IMPRESSION: 1. OG tube tip projects over the mid to distal stomach, appropriate in position. Electronically signed by: Franky Crease MD 09/27/2024 06:01 PM EST RP Workstation: [id number]   ECHOCARDIOGRAM COMPLETE Result Date: 09/27/2024    ECHOCARDIOGRAM REPORT   Patient Name:   AVRIE KEDZIERSKI Date of Exam: 09/27/2024 Medical Rec #:  [id number]     Height:       68.0 in Accession #:    [id number]    Weight:       441.0 lb Date of Birth:  1971/04/12    BSA:          2.860 m Patient Age:    52 years      BP:           143/82 mmHg Patient Gender: F             HR:           130 bpm. Exam Location:  Inpatient Procedure: 2D Echo, Color Doppler, Cardiac Doppler and Intracardiac            Opacification Agent (Both Spectral and Color Flow Doppler were            utilized during procedure). Indications:    Syncope  History:        Patient has no prior history of Echocardiogram examinations.  Sonographer:    Tinnie Gosling RDCS Referring Phys: [id number] FONDA BROCKS SMITH IMPRESSIONS  1. Technically difficult; apical images poor. Elevated mean gradient (13 mmHg) across aortic valve likely related to hyperdynamic LV function; visually aortic valve opens well.  2. Left ventricular ejection fraction, by estimation, is >75%. The left ventricle has hyperdynamic function. The left ventricle has no regional wall motion abnormalities. There is moderate left ventricular hypertrophy. Left ventricular diastolic parameters are indeterminate.  3. Right ventricular systolic function was not well visualized. The right ventricular size is not well  visualized.  4. The mitral valve is normal in structure. Trivial mitral valve regurgitation. No evidence of mitral stenosis.  5. The aortic valve is tricuspid. Aortic valve regurgitation is not visualized. No aortic stenosis is present.  6. The inferior vena cava is normal in size with greater than 50% respiratory variability, suggesting right atrial pressure of 3 mmHg. Comparison(s): No prior Echocardiogram. FINDINGS  Left Ventricle: Left ventricular ejection fraction, by estimation, is >75%. The left ventricle has hyperdynamic function. The left ventricle has no regional wall motion abnormalities. The left ventricular internal cavity size was normal in size. There is moderate left ventricular hypertrophy. Left ventricular diastolic parameters are indeterminate. Right Ventricle: The right ventricular size is not well visualized. Right vetricular wall thickness was not well visualized. Right ventricular systolic function was not well visualized. Left Atrium: Left atrial size was normal in size. Right Atrium: Right atrial size was not well visualized. Pericardium: There is no evidence of pericardial effusion. Mitral Valve: The mitral valve is normal in structure. Trivial mitral valve regurgitation. No evidence of mitral valve stenosis. Tricuspid Valve: The tricuspid valve is normal in structure. Tricuspid valve regurgitation is trivial. No evidence of tricuspid stenosis. Aortic Valve: The aortic valve is tricuspid. Aortic valve regurgitation  is not visualized. No aortic stenosis is present. Aortic valve mean gradient measures 13.0 mmHg. Aortic valve peak gradient measures 23.4 mmHg. Pulmonic Valve: The pulmonic valve was normal in structure. Pulmonic valve regurgitation is not visualized. No evidence of pulmonic stenosis. Aorta: The aortic root is normal in size and structure. Venous: The inferior vena cava is normal in size with greater than 50% respiratory variability, suggesting right atrial pressure of 3 mmHg.  IAS/Shunts: The interatrial septum was not well visualized. Additional Comments: Technically difficult; apical images poor. Elevated mean gradient (13 mmHg) across aortic valve likely related to hyperdynamic LV function; visually aortic valve opens well.  LEFT VENTRICLE PLAX 2D LVIDd:         4.60 cm      Diastology LVIDs:         2.60 cm      LV e' lateral:   10.30 cm/s LV PW:         1.40 cm      LV E/e' lateral: 9.9 LV IVS:        1.40 cm LVOT diam:     2.20 cm LVOT Area:     3.80 cm  LV Volumes (MOD) LV vol d, MOD A2C: 119.0 ml LV vol d, MOD A4C: 116.0 ml LV vol s, MOD A2C: 44.1 ml LV vol s, MOD A4C: 20.2 ml LV SV MOD A2C:     74.9 ml LV SV MOD A4C:     116.0 ml LV SV MOD BP:      96.0 ml IVC IVC diam: 2.10 cm LEFT ATRIUM           Index LA diam:      3.90 cm 1.36 cm/m LA Vol (A2C): 66.6 ml 23.29 ml/m  AORTIC VALVE AV Vmax:      242.00 cm/s AV Vmean:     170.000 cm/s AV VTI:       0.479 m AV Peak Grad: 23.4 mmHg AV Mean Grad: 13.0 mmHg  AORTA Ao Root diam: 2.60 cm Ao Asc diam:  2.90 cm MITRAL VALVE MV Area (PHT): 3.79 cm     SHUNTS MV Decel Time: 200 msec     Systemic Diam: 2.20 cm MV E velocity: 102.00 cm/s MV Emmogene Simson velocity: 119.00 cm/s MV E/Jabori Henegar ratio:  0.86 Redell Shallow MD Electronically signed by Redell Shallow MD Signature Date/Time: 09/27/2024/9:45:44 AM    Final    CT ANGIO HEAD NECK W WO CM Addendum Date: 09/27/2024 ** ADDENDUM #1 ** ADDENDUM: Study discussed by telephone with Dr. Charlyn at [phone] hours on 09/27/2024. ---------------------------------------------------- Electronically signed by: Helayne Hurst MD 09/27/2024 07:47 AM EST RP Workstation: [id number]   Result Date: 09/27/2024 ** ORIGINAL REPORT ** EXAM: CTA HEAD AND NECK WITHOUT AND WITH IV CONTRAST 09/27/2024 07:24:20 AM TECHNIQUE: CTA of the head and neck was performed without and with the administration of 80 mL of iohexol  (OMNIPAQUE ) 350 MG/ML injection. Multiplanar 2D and/or 3D reformatted images are provided for review. Automated  exposure control, iterative reconstruction, and/or weight based adjustment of the mA/kV was utilized to reduce the radiation dose to as low as reasonably achievable. Stenosis of the internal carotid arteries measured using NASCET criteria. COMPARISON: None available CLINICAL HISTORY: 53 year old female with aneurysmal rupture pattern of acute subarachnoid hemorrhage. FINDINGS: CTA NECK: AORTIC ARCH AND ARCH VESSELS: 3 vessel proximal arch configuration, but the left vertebral artery has an unusual distal arch / proximal descending thoracic aorta origin visible on series 7 image 338. No dissection or arterial  injury. No proximal subclavian artery atherosclerosis or stenosis. CAROTID ARTERIES: Retropharyngeal course of both carotid arteries, normal variant. Right carotid bifurcation and right ICA are retropharyngeal. Tortuous right ICA below the skull. No right carotid atherosclerosis or stenosis. Normal right posterior communicating artery origin. Left common carotid artery, left carotid bifurcation are retropharyngeal and otherwise normal. Tortuous left ICA distal to the bulb. No left internal carotid artery atherosclerosis or stenosis. No dissection or arterial injury. VERTEBRAL ARTERIES: Left vertebral artery: Ascending paraspinal course along the upper thoracic course levels, dives deep to the left 1st rib before entering the lower cervical transverse foramen. Nondominant and highly tortuous in the V2 segment. Patent to the vertebrobasilar junction without stenosis. Left PICA appears within normal limits. Right vertebral artery: Normal origin. Mildly dominant and tortuous throughout the neck with no plaque or stenosis to the vertebrobasilar junction. Right AICA appears dominant, normal variation. No proximal vertebral artery atherosclerosis or stenosis. No dissection or arterial injury. LUNGS AND MEDIASTINUM: Respiratory motion in the upper lungs which remain well aerated. Negative visible superior mediastinum.  SOFT TISSUES: Nonvascular neck soft tissue spaces are within normal limits. No acute abnormality. BONES: Mild for age bony cervical spine degeneration. No acute osseous abnormality. CTA HEAD: ANTERIOR CIRCULATION: Large and irregular supraclinoid left ICA, paraphalamic artery region saccular aneurysm on series 9 image 142 and series 7 image 120 measures 8 to 9 mm diameter. Aneurysm is directed cephalad. Distal to the aneurysm normal left posterior communicating artery origin is noted. The MCA and ACA origins also are normal. The anterior communicating arteries are diminutive or absent. No stenosis of the anterior cerebral arteries. No stenosis of the middle cerebral arteries. POSTERIOR CIRCULATION: Diminutive basilar artery on the basis of fetal type bilateral PCA origins. Normal SCA origins. No stenosis of the posterior cerebral arteries. No stenosis of the basilar artery. No stenosis of the vertebral arteries. No aneurysm. OTHER: Major dural venous sinuses are enhancing and appear patent. The right transverse and sigmoid venous sinuses are dominant, normal variation. No other intracranial aneurysm identified. IMPRESSION: 1. 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 2. No additional intracranial aneurysm identified. 3. 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 Electronically signed by: Helayne Hurst MD 09/27/2024 07:36 AM EST RP Workstation: [id number]   CT Head Wo Contrast Result Date: 09/27/2024 EXAM: CT HEAD WITHOUT CONTRAST 09/27/2024 07:01:03 AM TECHNIQUE: CT of the head was performed without the administration of intravenous contrast. Automated exposure control, iterative reconstruction, and/or weight based adjustment of the mA/kV was utilized to reduce the  radiation dose to as low as reasonably achievable. COMPARISON: None available. CLINICAL HISTORY: 53 year old female. Headache, sudden, severe. FINDINGS: LIMITATIONS/ARTIFACTS: Study mildly motion degraded, repeated. BRAIN AND VENTRICLES: Aneurysmal pattern acute subarachnoid hemorrhage with fairly symmetric filling of the basilar cisterns (series 4 image 14), and hemorrhage especially tracking along the anterior interhemispheric fissure. Hemorrhage throughout the ventral basilar cisterns. No evidence of acute infarct. Borderline to mild ventriculomegaly. No extra-axial collection. No mass effect or midline shift. Normal underlying brain volume. No intraventricular blood. Maintained gray white differentiation. No tonsillar herniation. ORBITS: Disconjugate gaze. SINUSES: Paranasal sinuses, tympanic cavities and mastoids are clear. SOFT TISSUES AND SKULL: No acute soft tissue abnormality. Intact skull. No acute osseous abnormality. IMPRESSION: 1. Aneurysmal rupture pattern Acute subarachnoid hemorrhage; pattern suggestive of Acomm lesion rupture. Recommend emergent CTA. 2.  Borderline to mild ventriculomegaly. No intraventricular hemorrhage. No midline shift. 3. Salient findings discussed by telephone with Dr. Charlyn at the time of this report. Electronically signed by: Helayne Hurst MD 09/27/2024 07:09 AM EST RP Workstation: [id number]    Microbiology: No results found for this or any previous visit (from the past 240 hours).   Labs: Basic Metabolic Panel: Recent Labs  Lab 10/07/24 0529 10/07/24 1918 10/08/24 0746 10/09/24 0253 10/10/24 0333 10/11/24 0607 10/12/24 0605  NA 139   < > 138 137 137 135 139  K 3.7  --  3.5 4.3 4.3 4.6 4.3  CL 104  --  102 102 101 103 104  CO2 26  --  28 21* 23 19* 24  GLUCOSE 103*  --  107* 103* 126* 110* 101*  BUN 10  --  9 11 17 16 13   CREATININE 1.09*  --  0.97 1.00 1.33* 1.00 0.95  CALCIUM 8.5*  --  8.6* 9.3 9.9 9.4 9.2  MG 2.1  --   --  2.2 2.4 2.2 2.1  PHOS  3.6  --   --  4.0 4.4 4.1 3.9   < > = values in this interval not displayed.   Liver Function Tests: Recent Labs  Lab 10/08/24 2142 10/09/24 0253 10/10/24 0333 10/11/24 0607 10/12/24 0605  AST 32 33 43* 43* 34  ALT 34 33 52* 48* 56*  ALKPHOS 200* 187* 216* 174* 163*  BILITOT 0.5 0.5 0.4 0.5 0.4  PROT 8.0 7.6 8.3* 7.4 7.5  ALBUMIN 3.4* 3.3* 3.9 3.1* 3.5   No results for input(s): LIPASE, AMYLASE in the last 168 hours. No results for input(s): AMMONIA in the last 168 hours. CBC: Recent Labs  Lab 10/08/24 0746 10/09/24 0253 10/10/24 0333 10/11/24 0607 10/12/24 0605  WBC 9.6 9.1 9.9 7.2 6.7  NEUTROABS  --  6.0 6.8 4.6 4.1  HGB 11.8* 12.7 12.8 12.6 12.4  HCT 36.9 39.8 40.1 39.6 38.6  MCV 85.6 85.4 86.2 87.4 86.2  PLT 367 388 480* 406* 452*   Cardiac Enzymes: No results for input(s): CKTOTAL, CKMB, CKMBINDEX, TROPONINI in the last 168 hours. BNP: BNP (last 3 results) No results for input(s): BNP in the last 8760 hours.  ProBNP (last 3 results) No results for input(s): PROBNP in the last 8760 hours.  CBG: No results for input(s): GLUCAP in the last 168 hours.     Signed:  Meliton Monte MD.  [facility] 10/12/2024, 1:28 PM       [1]  Allergies Allergen Reactions   Metformin  And Related Nausea Only

## 2024-10-12 NOTE — Plan of Care (Signed)
" °  Problem: Spontaneous Subarachnoid Hemorrhage Tissue Perfusion: Goal: Complications of Spontaneous Subarachnoid Hemorrhage will be minimized 10/12/2024 0504 by Leobardo Zada Ronnald GORMAN, RN Outcome: Progressing  Problem: Coping: Goal: Will verbalize positive feelings about self 10/12/2024 0504 by Leobardo Zada Ronnald GORMAN, RN Outcome: Progressing  Problem: Self-Care: Goal: Ability to participate in self-care as condition permits will improve 10/12/2024 0504 by Leobardo Zada Ronnald GORMAN, RN Outcome: Progressing   Problem: Nutrition: Goal: Risk of aspiration will decrease 10/12/2024 0504 by Leobardo Zada Ronnald GORMAN, RN Outcome: Progressing  Problem: Health Behavior/Discharge Planning: Goal: Ability to manage health-related needs will improve 10/12/2024 0504 by Leobardo Zada Ronnald GORMAN, RN Outcome: Progressing  Problem: Skin Integrity: Goal: Risk for impaired skin integrity will decrease 10/12/2024 0504 by Leobardo Zada Ronnald GORMAN, RN Outcome: Progressing 10/12/2024 0504 by Leobardo Zada Ronnald GORMAN, RN Outcome: Progressing   Problem: Clinical Measurements: Goal: Will remain free from infection 10/12/2024 0504 by Leobardo Zada Ronnald GORMAN, RN Outcome: Progressing 10/12/2024 0504 by Leobardo Zada Ronnald GORMAN, RN Outcome: Progressing   Problem: Activity: Goal: Risk for activity intolerance will decrease 10/12/2024 0504 by Leobardo Zada Ronnald GORMAN, RN Outcome: Progressing  Problem: Safety: Goal: Ability to remain free from injury will improve 10/12/2024 0504 by Leobardo Zada Ronnald GORMAN, RN Outcome: Progressing "

## 2024-10-12 NOTE — Progress Notes (Addendum)
 DISCHARGE NOTE HOME Satin Gasner to be discharged Home per MD order. Discussed prescriptions and follow up appointments with the patient. Prescriptions given to patient; medication list explained in detail. Patient verbalized understanding.  Reviewed Stroke signs and symptoms, risk factors, 911 activations, life style modifications, and other stroke into in discharge paperwork.  Answered all patient questions regarding stroke.  Skin clean, dry and intact without evidence of skin break down, no evidence of skin tears noted. IV catheter discontinued intact. Site without signs and symptoms of complications. Dressing and pressure applied. Pt denies pain at the site currently. No complaints noted.  See LDA for wound left knee Patient free of other lines, drains, and wounds.   An After Visit Summary (AVS) was printed and given to the patient. Patient escorted via wheelchair, and discharged home via private auto.  Peyton SHAUNNA Pepper, RN

## 2024-10-12 NOTE — Progress Notes (Signed)
" °   10/12/24 0100  BiPAP/CPAP/SIPAP  $ Non-Invasive Ventilator  Non-Invasive Vent Subsequent  BiPAP/CPAP/SIPAP Pt Type Adult  BiPAP/CPAP/SIPAP Resmed  Mask Type Full face mask  Mask Size Medium  Respiratory Rate 18 breaths/min  Patient Home Machine No  Patient Home Mask No  Patient Home Tubing No  Auto Titrate Yes  CPAP/SIPAP surface wiped down Yes  Device Plugged into RED Power Outlet Yes  BiPAP/CPAP /SiPAP Vitals  Bilateral Breath Sounds Diminished;Clear    "

## 2024-10-14 ENCOUNTER — Other Ambulatory Visit (HOSPITAL_COMMUNITY): Payer: Self-pay

## 2024-10-22 ENCOUNTER — Ambulatory Visit: Admitting: Occupational Therapy

## 2024-10-22 ENCOUNTER — Ambulatory Visit: Admitting: Physical Therapy

## 2024-10-28 ENCOUNTER — Telehealth: Payer: Self-pay

## 2024-10-28 DIAGNOSIS — I609 Nontraumatic subarachnoid hemorrhage, unspecified: Secondary | ICD-10-CM

## 2024-10-28 DIAGNOSIS — I639 Cerebral infarction, unspecified: Secondary | ICD-10-CM

## 2024-10-28 NOTE — Patient Outreach (Signed)
 Stroke Discharge Follow-up   10/28/2024 Name:  Ashlee Gordon MRN:  [id number] DOB:  20-Jul-1971  Subjective: Ashlee Gordon is a 54 y.o. year old female who is a primary care patient of [facility] An Emmi alert was received indicating patient responded to questions: Went to follow-up appointment?. I reached out by phone to follow up on the alert and spoke to Patient.  Care Management Interventions: Pt reports her follow up with neuro is 11/06/24, pt scored positive for SDOH need/ housing instability, she would like outreach from child psychotherapist for options as she is having difficulty paying her rent on time, does not feel like she has enough money every month to pay her rent.  Discussed with manager Rama Pilling, advised to complete referral for one time outreach.  Follow up plan: Referral made to social work for SDOH needs/ housing instability   Mliss Creed [facility], BSN RN Care Manager/ Transition of Care [place]/ [facility] [phone]

## 2024-10-29 ENCOUNTER — Encounter: Payer: Self-pay | Admitting: Occupational Therapy

## 2024-10-29 ENCOUNTER — Encounter: Payer: Self-pay | Admitting: Physical Therapy

## 2024-10-29 ENCOUNTER — Other Ambulatory Visit: Payer: Self-pay

## 2024-10-29 ENCOUNTER — Ambulatory Visit: Admitting: Occupational Therapy

## 2024-10-29 ENCOUNTER — Ambulatory Visit: Attending: Family Medicine | Admitting: Physical Therapy

## 2024-10-29 VITALS — BP 148/70 | HR 72

## 2024-10-29 DIAGNOSIS — M5459 Other low back pain: Secondary | ICD-10-CM | POA: Insufficient documentation

## 2024-10-29 DIAGNOSIS — M6281 Muscle weakness (generalized): Secondary | ICD-10-CM

## 2024-10-29 DIAGNOSIS — I609 Nontraumatic subarachnoid hemorrhage, unspecified: Secondary | ICD-10-CM | POA: Diagnosis not present

## 2024-10-29 DIAGNOSIS — R2689 Other abnormalities of gait and mobility: Secondary | ICD-10-CM | POA: Diagnosis present

## 2024-10-29 DIAGNOSIS — R29818 Other symptoms and signs involving the nervous system: Secondary | ICD-10-CM | POA: Diagnosis not present

## 2024-10-29 DIAGNOSIS — R2681 Unsteadiness on feet: Secondary | ICD-10-CM | POA: Insufficient documentation

## 2024-10-29 NOTE — Therapy (Signed)
 " OUTPATIENT OCCUPATIONAL THERAPY NEURO EVALUATION  Patient Name: Ashlee Gordon MRN: [id number] DOB:1970-12-21, 54 y.o., female Today's Date: 10/29/2024  PCP: [facility] REFERRING PROVIDER: Perri DELENA Meliton Mickey., MD  END OF SESSION:  OT End of Session - 10/29/24 0933     Visit Number 1    Number of Visits 1    Authorization Type [employer] Medicaid 2026 $4 copay    OT Start Time 0932    OT Stop Time 1015    OT Time Calculation (min) 43 min    Equipment Utilized During Treatment Testing material    Activity Tolerance Patient tolerated treatment well    Behavior During Therapy WFL for tasks assessed/performed          Past Medical History:  Diagnosis Date   Anemia    Depression    Diabetes mellitus without complication (HCC)    Fibromyalgia    Hypertension    Infertility, female    Morbid obesity (HCC)    OSA (obstructive sleep apnea)    Osteoarthritis    SAH (subarachnoid hemorrhage) (HCC) 09/27/2024   SOBOE (shortness of breath on exertion)    Swelling of both lower extremities    Vitamin D  deficiency    Past Surgical History:  Procedure Laterality Date   IR ANGIO INTRA EXTRACRAN SEL INTERNAL CAROTID UNI L MOD SED  09/27/2024   IR ANGIOGRAM FOLLOW UP STUDY  09/27/2024   IR ANGIOGRAM FOLLOW UP STUDY  09/27/2024   IR ANGIOGRAM FOLLOW UP STUDY  09/27/2024   IR ANGIOGRAM FOLLOW UP STUDY  09/27/2024   IR ANGIOGRAM FOLLOW UP STUDY  09/27/2024   IR INJECT/THERA/INC NEEDLE/CATH/PLC EPI/LUMB/SAC W/IMG  08/05/2021   IR TRANSCATH/EMBOLIZ  09/27/2024   IR US  GUIDE VASC ACCESS RIGHT  09/27/2024   IR US  GUIDE VASC ACCESS RIGHT  09/27/2024   RADIOLOGY WITH ANESTHESIA N/A 09/27/2024   Procedure: RADIOLOGY WITH ANESTHESIA;  Surgeon: Radiologist, Medication, MD;  Location: [facility] OR;  Service: Radiology;  Laterality: N/A;   Patient Active Problem List   Diagnosis Date Noted   SAH (subarachnoid hemorrhage) (HCC) 09/27/2024   Obesity, unspecified 03/21/2024   Vitamin D   deficiency 12/06/2022   H/O gastric sleeve 11/22/2022   Primary hypertension 11/22/2022   Class 3 severe obesity with serious comorbidity and body mass index (BMI) of 60.0 to 69.9 in adult [facility]) 10/27/2022   OSA (obstructive sleep apnea) 10/27/2022   Prediabetes 10/27/2022    ONSET DATE: 10/08/2024  REFERRING DIAG: I60.9 (ICD-10-CM) - SAH (subarachnoid hemorrhage)  THERAPY DIAG:  Other symptoms and signs involving the nervous system  Muscle weakness (generalized)  Rationale for Evaluation and Treatment: Rehabilitation  SUBJECTIVE:   SUBJECTIVE STATEMENT: Pt reported she was very fortunate with her recent stroke, in that the possible complications have been minimal.  She does note issues when she is tired though ie) speech is slightly slower and she has to work to gather her thoughs and will lose her train of thought.  She is also off balance since the stroke but doesn't have much concern with her UEs at this time except for R shoulder had been painful recently and she received an injection for pain.   Pt also reports that she has issues with DJD - especially in her low back.  She has injections scheduled March 3rd at [facility].  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 NIH liberalized to q 4 hours. 12/16 [facility] assumed care Stable for discharge 12/20  PRECAUTIONS: Fall  WEIGHT BEARING RESTRICTIONS: No  PAIN:  Are you having pain? Yes: NPRS scale: 5-6/10  Pain location: low back, legs and slight headache Pain description: hurting but  not throbbing Aggravating factors: standing, sitting Relieving factors: Uncertain, meds sometimes - being in water (aquatic)  FALLS: Has patient fallen in last 6 months? No  LIVING ENVIRONMENT: Lives with: Prior to this pt lived alone but her sister has moved to be with her since hospitalization and plans to stay Lives in: Mobile home Stairs: Yes: External: 5 steps; on left going up Has following equipment at home: Walker - 4 wheeled (not rollator) and bed side commode  PLOF: Independent, Independent with homemaking with ambulation, and Independent with transfers  PATIENT GOALS: Strengthen extremities, overall be better/healthier, walk with better balance, back to being independent  OBJECTIVE:  Note: Objective measures were completed at Evaluation unless otherwise noted.  HAND DOMINANCE: Right  ADLs: Overall ADLs: Mod I Transfers/ambulation related to ADLs: Mod I (extra time) Eating: Ind Grooming: Ind UB Dressing: Mod I LB Dressing: Mod I - extra time Toileting: Mod I Bathing: Mod I Pt reports only showering 2x since hospital mostly just sponge bathing Tub Shower transfers: stood in shower 2x with sister for supervision Equipment: none  IADLs: Shopping: Has gone with her sister; uses engineer, [employer] but had to leave [employer] recently due to over stimualtion Light housekeeping: Pt's sister prepares meals and does laundry Meal Prep: Pt's sister has been preparing meals Community mobility: Mod I - short distances without AE and long distances with electric cart in stores Medication management: Ind - Pt is ind and sorts meds into pill box Financial management: Ind Handwriting: WFL  MOBILITY STATUS: Independent  POSTURE COMMENTS:  rounded shoulders, forward head, and posterior pelvic tilt Sitting balance: WFL  ACTIVITY TOLERANCE: Activity tolerance: Fair  UPPER EXTREMITY ROM:    Grossly WFL; some limitation due to body habitus   UPPER EXTREMITY MMT:     MMT  Right eval Left eval  Shoulder flexion 4 4  Shoulder abduction    Shoulder adduction    Shoulder extension    Shoulder internal rotation    Shoulder external rotation    Middle trapezius    Lower trapezius    Elbow flexion 4 4  Elbow extension 4 4  Wrist flexion    Wrist extension    Wrist ulnar deviation    Wrist radial deviation    Wrist pronation    Wrist supination    (Blank rows = not tested)  HAND FUNCTION: Grip strength: Right: 47.8, 50.2, 54.8  lbs; Left: 46.5, 52.0, 48.2 lbs Average: Right: 50.9 lbs; Left: 48.9 lbs  COORDINATION: 9 Hole Peg test: Right: 23.35 sec; Left: 26.48 sec  SENSATION: WFL  EDEMA: NA  MUSCLE TONE: WFL  COGNITION: Overall cognitive status: Within functional limits for tasks assessed  VISION: Subjective report: No issues  OBSERVATIONS: Pt ambulates with no use of AD and no loss of balance but walks at slow pace and needs extra time.  TREATMENT DATE: 10/29/24   OT educated pt on rehabilitation process and results of objective measures in relation to pt specific concerns with plans not to do formal OT at this time as pt's main limitations are d/t LE issues and pt can compensate by sitting for ADLs/IADLS. Education provided on shower bench options for use at home to enhance safety and efficiency with shower transfers due to decreased strength, body habitus considerations, and fall risk.  Treatment time not billed as insurance does not reimburse for treatment provided at evaluation.    PATIENT EDUCATION: Education details: OT role and POC Considerations  Person educated: Patient Education method: Explanation and Verbal cues Education comprehension: verbalized understanding  HOME EXERCISE PROGRAM: NA  ASSESSMENT:  CLINICAL IMPRESSION: Patient is a 54 y.o. female who was seen today for occupational therapy  evaluation for weakness s/p recent SAH. Hx includes HTN, OSA, obesity s/p gastric sleeve, pre diabetic, fibromyalgia, OA . Patient currently presents near baseline level of function demonstrating more functional deficits and impairments in LEs than UEs. Pt would benefit from skilled PT services in the outpatient setting at this time with plans to wait to see if IADLS improve based on improved LE strength and endurance.  PERFORMANCE DEFICITS: in functional skills including ADLs, IADLs, strength, mobility, balance, endurance, and decreased knowledge of use of DME,   IMPAIRMENTS: are limiting patient from ADLs, IADLs, and social participation.   CO-MORBIDITIES: may have co-morbidities  that affects occupational performance. Patient will benefit from skilled OT to address above impairments and improve overall function.  MODIFICATION OR ASSISTANCE TO COMPLETE EVALUATION: No modification of tasks or assist necessary to complete an evaluation.  OT OCCUPATIONAL PROFILE AND HISTORY: Problem focused assessment: Including review of records relating to presenting problem.  CLINICAL DECISION MAKING: LOW - limited treatment options, no task modification necessary  REHAB POTENTIAL: Good  EVALUATION COMPLEXITY: Low    PLAN:  Patient needs addressed at eval. No further need for skilled OT services at this time.     Clarita LITTIE Pride, OT 10/29/2024, 9:34 AM           "
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
## 2024-10-30 ENCOUNTER — Telehealth: Payer: Self-pay | Admitting: *Deleted

## 2024-10-30 NOTE — Progress Notes (Signed)
 Complex Care Management Note  Care Guide Note 10/30/2024 Name: Ashlee Gordon MRN: [id number] DOB: 05/09/1971  Ashlee Gordon is a 54 y.o. year old female who [facility]. I reached out to Shandiin Matzen by phone today to offer complex care management services.  Ms. Pickering was given information about Complex Care Management services today including:   The Complex Care Management services include support from the care team which includes your Nurse Care Manager, Clinical Social Worker, or Pharmacist.  The Complex Care Management team is here to help remove barriers to the health concerns and goals most important to you. Complex Care Management services are voluntary, and the patient may decline or stop services at any time by request to their care team member.   Complex Care Management Consent Status: Patient agreed to services and verbal consent obtained.   Follow up plan:  Telephone appointment with complex care management team member scheduled for:  11/04/2024  Encounter Outcome:  Patient Scheduled  Thedford Franks, CMA, AAMA [place]  [facility], [facility] Guide, Lead Direct Dial: [phone]  Fax: [phone]

## 2024-11-04 ENCOUNTER — Telehealth: Payer: Self-pay

## 2024-11-04 NOTE — Patient Instructions (Signed)
 Emerita Brzozowski - I am sorry I was unable to reach you today for our scheduled appointment. I work with [facility] and am calling to support your healthcare needs. Please contact me at [phone] at your earliest convenience. I look forward to speaking with you soon.   Thank you,  Tillman Gardener, BSW [place]  [facility] Social Worker Direct Dial: [phone]  Fax: [phone] Website: [email or web address]

## 2024-11-06 ENCOUNTER — Other Ambulatory Visit: Payer: Self-pay

## 2024-11-06 ENCOUNTER — Encounter (INDEPENDENT_AMBULATORY_CARE_PROVIDER_SITE_OTHER): Payer: Self-pay | Admitting: Internal Medicine

## 2024-11-06 ENCOUNTER — Ambulatory Visit (INDEPENDENT_AMBULATORY_CARE_PROVIDER_SITE_OTHER): Admitting: Neuroradiology

## 2024-11-06 ENCOUNTER — Other Ambulatory Visit (HOSPITAL_COMMUNITY): Payer: Self-pay

## 2024-11-06 ENCOUNTER — Encounter: Payer: Self-pay | Admitting: Neuroradiology

## 2024-11-06 ENCOUNTER — Ambulatory Visit (INDEPENDENT_AMBULATORY_CARE_PROVIDER_SITE_OTHER): Admitting: Internal Medicine

## 2024-11-06 VITALS — BP 136/87 | HR 76 | Temp 98.2°F | Ht 69.0 in | Wt >= 6400 oz

## 2024-11-06 VITALS — BP 133/86 | HR 89 | Temp 98.8°F | Ht 69.0 in | Wt >= 6400 oz

## 2024-11-06 DIAGNOSIS — I6002 Nontraumatic subarachnoid hemorrhage from left carotid siphon and bifurcation: Secondary | ICD-10-CM

## 2024-11-06 DIAGNOSIS — Z903 Acquired absence of stomach [part of]: Secondary | ICD-10-CM | POA: Diagnosis not present

## 2024-11-06 DIAGNOSIS — I671 Cerebral aneurysm, nonruptured: Secondary | ICD-10-CM

## 2024-11-06 DIAGNOSIS — I10 Essential (primary) hypertension: Secondary | ICD-10-CM

## 2024-11-06 DIAGNOSIS — Z95828 Presence of other vascular implants and grafts: Secondary | ICD-10-CM

## 2024-11-06 DIAGNOSIS — J4520 Mild intermittent asthma, uncomplicated: Secondary | ICD-10-CM | POA: Insufficient documentation

## 2024-11-06 DIAGNOSIS — E66813 Obesity, class 3: Secondary | ICD-10-CM

## 2024-11-06 DIAGNOSIS — Z6841 Body Mass Index (BMI) 40.0 and over, adult: Secondary | ICD-10-CM | POA: Diagnosis not present

## 2024-11-06 DIAGNOSIS — M150 Primary generalized (osteo)arthritis: Secondary | ICD-10-CM | POA: Insufficient documentation

## 2024-11-06 DIAGNOSIS — R7303 Prediabetes: Secondary | ICD-10-CM

## 2024-11-06 MED ORDER — METFORMIN HCL ER 500 MG PO TB24
500.0000 mg | ORAL_TABLET | Freq: Two times a day (BID) | ORAL | 0 refills | Status: AC
  Filled 2024-11-06: qty 60, 30d supply, fill #0

## 2024-11-06 NOTE — Assessment & Plan Note (Signed)
 The patient expresses a desire to lose weight but is currently not ready to initiate consistent behavior change.  They acknowledge the importance of weight management you have reported difficulty implementing or sustaining lifestyle modifications motivational emotion or situational barriers.  At this time they remain in the contemplative stage of change recognizing the need for improvement but struggling to take actionable steps.  She will be scheduled for a 40-minute visit.  I recommend repeating indirect calorimetry and we will initiate new meal plan focusing on simplicity and convenience.  Considering degree of obesity and cardiometabolic risk she may also benefit from treatment with a GLP-1.  We first need to work on lifestyle changes.

## 2024-11-06 NOTE — Progress Notes (Signed)
 I had the pleasure of seeing Ashlee Gordon in the office today.  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  She has made a really great recovery.  Completely neurologically intact.  She can stop taking aspirin .  We talked about the need for follow-up and I will schedule an angiogram at 6 months.  I have asked her to get a blood pressure cuff to monitor her blood pressure.

## 2024-11-06 NOTE — Addendum Note (Signed)
 Addended by: LESTER NANCYANN GAILS on: 11/06/2024 02:51 PM   Modules accepted: Orders

## 2024-11-06 NOTE — Progress Notes (Signed)
 "  Office: [phone]  /  Fax: [phone]  Weight Summary and Body Composition Analysis (BIA)  Vitals Temp: 98.8 F (37.1 C) BP: 133/86 Pulse Rate: 89 SpO2: 99 %   Anthropometric Measurements Height: 5' 9 (1.753 m) Weight: (!) 421 lb (191 kg) BMI (Calculated): 62.14 Weight at Last Visit: 424 lb Weight Lost Since Last Visit: 0 lb Weight Gained Since Last Visit: 3 lb Starting Weight: 421 lb Total Weight Loss (lbs): 0 lb (0 kg) Peak Weight: 546 lb   Body Composition  Body Fat %: 64.4 % Fat Mass (lbs): 273.4 lbs Muscle Mass (lbs): 143.6 lbs Visceral Fat Rating : 30    RMR: 3096  Today's Visit #: no  Starting Date: 11/15/22   Subjective   Chief Complaint: Obesity  Interval History  Discussed the use of AI scribe software for clinical note transcription with the patient, who gave verbal consent to proceed.  History of Present Illness Ashlee Gordon is a 53 year old female who presents for medical weight management.  She has not been following her prescribed nutrition plan targeting 1800 cal.  She also has not been exercising as she has been hospitalized recently with a subarachnoid hemorrhage.  She recently experienced a significant medical event involving a brain aneurysm. She had a sudden headache, which was unusual for her, followed by a popping sound in her head. Her sister took her to the emergency room where she underwent emergency surgery. She was hospitalized for 15 days, including 10 days in intensive care. She recalls little from her time in intensive care but remembers being advised to rest her brain. She has since seen her neurologist for follow-up and reports no restrictions on her activities. She was initially prescribed Fioricet  for headaches by a hospital doctor.  Her family history includes a brother who had a stroke and remains paralyzed, though she is unsure if it involved a brain bleed. She is not aware of any family history of aneurysms.  She  has a history of sleep apnea and was placed on CPAP during her hospital stay. She recalls having a sleep study done approximately six years ago and has brought information from a previous provider to her current doctor.  She is currently not taking aspirin  and is considering restarting metformin . She is also not taking Wellbutrin  at the moment, as it may affect her blood pressure, which was recorded at 133/80 during the visit.     Challenges affecting patient progress: recent lapse in weight management plan due to work, travel, illness or family circumstances.    Pharmacotherapy for weight management: She is currently taking Metformin  (off label use for weight management and / or insulin  resistance and / or diabetes prevention) with adequate clinical response  and without side effects. and Bupropion  (single agent, off label use) which she has not been taking.   Assessment and Plan   Treatment Plan For Obesity:  Recommended Dietary Goals  Ashlee Gordon is currently in the action stage of change. As such, her goal is to continue weight management plan. She has agreed to: portion control, balanced plate and making smarter food choices, such as increasing vegetables, protein intake and reducing simple carbohydrates and processed foods  and continue to work on implementation of reduced calorie nutrition plan (RCNP)  Behavioral Health and Counseling  We discussed the following behavioral modification strategies today: avoid all-or-none thinking and rethink your why and reflect on non-scale accomplishments.  Additional education and resources provided today: None  Recommended Physical Activity Goals  Ashlee Gordon has been advised to work up to 150 minutes of moderate intensity aerobic activity a week and strengthening exercises 2-3 times per week for cardiovascular health, weight loss maintenance and preservation of muscle mass.  She has agreed to :  Think about enjoyable ways to increase daily physical  activity and overcoming barriers to exercise and Increase physical activity in their day and reduce sedentary time (increase NEAT).  Medical Interventions and Pharmacotherapy  We discussed various medication options to help Ashlee Gordon with her weight loss efforts and we both agreed to : Adequate clinical response to anti-obesity medication, continue current anti-obesity regimen  Associated Conditions Impacted by Obesity Treatment  Assessment & Plan Prediabetes Lab Results  Component Value Date   HGBA1C 6.1 (H) 09/30/2024   HGBA1C 6.1 (H) 07/24/2023   HGBA1C 6.1 (H) 11/22/2022   Stable at this time.  I will like for her to resume treatment with metformin .  She will start metformin  XR 500 mg twice a day.  She will be working on implementing nutritional changes over the next few months.   Class 3 severe obesity with serious comorbidity and body mass index (BMI) of 60.0 to 69.9 in adult, unspecified obesity type (HCC) H/O gastric sleeve The patient expresses a desire to lose weight but is currently not ready to initiate consistent behavior change.  They acknowledge the importance of weight management you have reported difficulty implementing or sustaining lifestyle modifications motivational emotion or situational barriers.  At this time they remain in the contemplative stage of change recognizing the need for improvement but struggling to take actionable steps.  She will be scheduled for a 40-minute visit.  I recommend repeating indirect calorimetry and we will initiate new meal plan focusing on simplicity and convenience.  Considering degree of obesity and cardiometabolic risk she may also benefit from treatment with a GLP-1.  We first need to work on lifestyle changes.  Primary hypertension Vitals:   11/06/24 1500  BP: 133/86   Stable. Blood pressure control expected to improve with continued weight reduction. No medication changes indicated today; continue current antihypertensive regimen  and home BP monitoring.   Patient advised to hold off starting Wellbutrin   Brain aneurysm Recent hospitalization for ruptured cerebral aneurysm, status post emergency repair. No family history of aneurysms. Blood pressure control is crucial to prevent recurrence. Aspirin  has been discontinued as per neurologist's advice. - Ensure blood pressure is well controlled. - Discontinued aspirin  as per neurologist's advice.       Objective   Physical Exam:  Blood pressure 133/86, pulse 89, temperature 98.8 F (37.1 C), height 5' 9 (1.753 m), weight (!) 421 lb (191 kg), last menstrual period 10/22/2020, SpO2 99%. Body mass index is 62.17 kg/m.  General: She is overweight, cooperative, alert, well developed, and in no acute distress. PSYCH: Has normal mood, affect and thought process.   HEENT: EOMI, sclerae are anicteric. Lungs: Normal breathing effort, no conversational dyspnea. Extremities: No edema.  Neurologic: No gross sensory or motor deficits. No tremors or fasciculations noted.    Diagnostic Data Reviewed:  BMET    Component Value Date/Time   NA 139 10/12/2024 0605   NA 146 07/31/2024 0000   K 4.3 10/12/2024 0605   CL 104 10/12/2024 0605   CO2 24 10/12/2024 0605   GLUCOSE 101 (H) 10/12/2024 0605   BUN 13 10/12/2024 0605   BUN 15 07/31/2024 0000   CREATININE 0.95 10/12/2024 0605   CALCIUM 9.2 10/12/2024 0605   GFRNONAA >60 10/12/2024 9394  Lab Results  Component Value Date   HGBA1C 6.1 (H) 09/30/2024   HGBA1C 6.1 (H) 11/22/2022   Lab Results  Component Value Date   INSULIN  37.9 (H) 11/22/2022   Lab Results  Component Value Date   TSH 1.220 11/22/2022   CBC    Component Value Date/Time   WBC 6.7 10/12/2024 0605   RBC 4.48 10/12/2024 0605   HGB 12.4 10/12/2024 0605   HGB 12.8 11/22/2022 1016   HCT 38.6 10/12/2024 0605   HCT 41.0 11/22/2022 1016   PLT 452 (H) 10/12/2024 0605   MCV 86.2 10/12/2024 0605   MCV 86 11/22/2022 1016   MCH 27.7 10/12/2024 0605    MCHC 32.1 10/12/2024 0605   RDW 14.9 10/12/2024 0605   RDW 13.8 11/22/2022 1016   Iron Studies No results found for: IRON, TIBC, FERRITIN, IRONPCTSAT Lipid Panel     Component Value Date/Time   CHOL 150 09/29/2024 0444   CHOL 185 11/22/2022 1016   TRIG 150 (H) 09/29/2024 0444   HDL 44 09/29/2024 0444   HDL 54 11/22/2022 1016   CHOLHDL 3.4 09/29/2024 0444   VLDL 30 09/29/2024 0444   LDLCALC 76 09/29/2024 0444   LDLCALC 117 (H) 11/22/2022 1016   Hepatic Function Panel     Component Value Date/Time   PROT 7.5 10/12/2024 0605   PROT 7.5 11/22/2022 1016   ALBUMIN 3.5 10/12/2024 0605   ALBUMIN 4.0 11/22/2022 1016   AST 34 10/12/2024 0605   ALT 56 (H) 10/12/2024 0605   ALKPHOS 163 (H) 10/12/2024 0605   BILITOT 0.4 10/12/2024 0605   BILITOT 0.2 11/22/2022 1016   BILIDIR 0.1 10/08/2024 2142   IBILI 0.3 10/08/2024 2142      Component Value Date/Time   TSH 1.220 11/22/2022 1016   Nutritional Lab Results  Component Value Date   VD25OH 27.8 (L) 07/24/2023   VD25OH 13.9 (L) 11/22/2022    Medications: Outpatient Encounter Medications as of 11/06/2024  Medication Sig Note   metFORMIN  (GLUCOPHAGE -XR) 500 MG 24 hr tablet Take 1 tablet (500 mg total) by mouth 2 (two) times daily with a meal.    amLODipine  (NORVASC ) 10 MG tablet Take 1 tablet (10 mg total) by mouth daily. Follow with outpatient provider for refills    butalbital -acetaminophen -caffeine  (FIORICET ) 50-325-40 MG tablet Take 1 tablet by mouth 2 (two) times daily as needed for headache. Try not to use this medicine daily as it can lead to rebound headaches.    carvedilol  (COREG ) 12.5 MG tablet Take 1 tablet (12.5 mg total) by mouth 2 (two) times daily with a meal. Follow with your outpatient doctor for refills.    cyclobenzaprine  (FLEXERIL ) 5 MG tablet Take 0.5-1 tablets (2.5-5 mg total) by mouth 3 (three) times daily as needed for muscle spasms.    DULoxetine  (CYMBALTA ) 30 MG capsule Take 1 capsule (30 mg total)  by mouth daily. Follow up with your outpatient doctor for refills.  They should consider uptitrating this dose.    gabapentin  (NEURONTIN ) 300 MG capsule Take 2 capsules (600 mg total) by mouth 3 (three) times daily.    hydrALAZINE  (APRESOLINE ) 100 MG tablet Take 1 tablet (100 mg total) by mouth every 8 (eight) hours. Follow with your outpatient doctor for refills    HYDROcodone-acetaminophen  (NORCO) 10-325 MG tablet Take 1 tablet by mouth 3 (three) times daily as needed for severe pain (pain score 7-10) or moderate pain (pain score 4-6). 09/30/2024: Filled the last three months for a 30 day supply with  a quantity of 90    losartan  (COZAAR ) 50 MG tablet Take 50 mg by mouth daily. 09/30/2024:  Recent Dispenses     07/30/2024 50 MG TABS (disp 90, 90d supply) 05/02/2024 50 MG TABS (disp 90) 01/24/2024 50 MG TABS (disp 90, 90d supply)      pantoprazole  (PROTONIX ) 40 MG tablet Take 1 tablet (40 mg total) by mouth daily.    [DISCONTINUED] aspirin  81 MG chewable tablet Chew 1 tablet (81 mg total) by mouth daily.    [DISCONTINUED] buPROPion  (WELLBUTRIN  XL) 150 MG 24 hr tablet Take 1 tablet (150 mg total) by mouth daily. (Patient not taking: Reported on 11/06/2024) 09/30/2024:  Recent Dispenses     05/01/2024 150 MG TB24 (disp 90, 90d supply)      [DISCONTINUED] Cyanocobalamin (B-12 COMPLIANCE INJECTION) 1000 MCG/ML KIT Inject 1,000 mcg as directed See admin instructions. Only at doctor's appointments (Patient not taking: Reported on 11/06/2024)    No facility-administered encounter medications on file as of 11/06/2024.     Follow-Up   Return in about 4 weeks (around 12/04/2024) for 40 minutes, Fasting and 30 minutes early for IC.SABRA She was informed of the importance of frequent follow up visits to maximize her success with intensive lifestyle modifications for her multiple health conditions.  Attestation Statement   Reviewed by clinician on day of visit: allergies, medications, problem list,  medical history, surgical history, family history, social history, and previous encounter notes.     Lucas Parker, MD  "

## 2024-11-06 NOTE — Assessment & Plan Note (Signed)
 Vitals:   11/06/24 1500  BP: 133/86   Stable. Blood pressure control expected to improve with continued weight reduction. No medication changes indicated today; continue current antihypertensive regimen and home BP monitoring.   Patient advised to hold off starting Wellbutrin 

## 2024-11-06 NOTE — Assessment & Plan Note (Signed)
 Lab Results  Component Value Date   HGBA1C 6.1 (H) 09/30/2024   HGBA1C 6.1 (H) 07/24/2023   HGBA1C 6.1 (H) 11/22/2022   Stable at this time.  I will like for her to resume treatment with metformin .  She will start metformin  XR 500 mg twice a day.  She will be working on implementing nutritional changes over the next few months.

## 2024-11-06 NOTE — Assessment & Plan Note (Signed)
 Recent hospitalization for ruptured cerebral aneurysm, status post emergency repair. No family history of aneurysms. Blood pressure control is crucial to prevent recurrence. Aspirin  has been discontinued as per neurologist's advice. - Ensure blood pressure is well controlled. - Discontinued aspirin  as per neurologist's advice.

## 2024-11-06 NOTE — Assessment & Plan Note (Addendum)
 The patient expresses a desire to lose weight but is currently not ready to initiate consistent behavior change.  They acknowledge the importance of weight management you have reported difficulty implementing or sustaining lifestyle modifications motivational emotion or situational barriers.  At this time they remain in the contemplative stage of change recognizing the need for improvement but struggling to take actionable steps.  She will be scheduled for a 40-minute visit.  I recommend repeating indirect calorimetry and we will initiate new meal plan focusing on simplicity and convenience.  Considering degree of obesity and cardiometabolic risk she may also benefit from treatment with a GLP-1.  We first need to work on lifestyle changes.

## 2024-11-07 ENCOUNTER — Telehealth: Payer: Self-pay | Admitting: *Deleted

## 2024-11-07 ENCOUNTER — Encounter: Payer: Self-pay | Admitting: Physical Therapy

## 2024-11-07 ENCOUNTER — Ambulatory Visit: Admitting: Physical Therapy

## 2024-11-07 DIAGNOSIS — R2689 Other abnormalities of gait and mobility: Secondary | ICD-10-CM

## 2024-11-07 DIAGNOSIS — M6281 Muscle weakness (generalized): Secondary | ICD-10-CM

## 2024-11-07 DIAGNOSIS — M5459 Other low back pain: Secondary | ICD-10-CM

## 2024-11-07 DIAGNOSIS — R2681 Unsteadiness on feet: Secondary | ICD-10-CM

## 2024-11-07 DIAGNOSIS — R29818 Other symptoms and signs involving the nervous system: Secondary | ICD-10-CM

## 2024-11-07 NOTE — Therapy (Signed)
 " OUTPATIENT PHYSICAL THERAPY NEURO TREATMENT   Patient Name: Ashlee Gordon MRN: [id number] DOB:[birth year], 54 y.o., female Today's Date: 11/07/2024   PCP: [facility] (pt is looking into PCP at [facility] as her prior PCP just left practice) REFERRING PROVIDER: Perri DELENA Meliton Mickey., MD  END OF SESSION:  PT End of Session - 11/07/24 0936     Visit Number 2    Number of Visits 9   8 + eval   Date for Recertification  01/03/25   pushed out due to potential scheduling delay   Authorization Type [place] Medicaid    Progress Note Due on Visit 10    PT Start Time 0930    PT Stop Time 1016    PT Time Calculation (min) 46 min    Equipment Utilized During Treatment Gait belt    Activity Tolerance Patient tolerated treatment well    Behavior During Therapy WFL for tasks assessed/performed          Past Medical History:  Diagnosis Date   Anemia    Depression    Diabetes mellitus without complication (HCC)    Fibromyalgia    Hypertension    Infertility, female    Morbid obesity (HCC)    OSA (obstructive sleep apnea)    Osteoarthritis    SAH (subarachnoid hemorrhage) (HCC) 09/27/2024   SOBOE (shortness of breath on exertion)    Swelling of both lower extremities    Vitamin D  deficiency    Past Surgical History:  Procedure Laterality Date   IR ANGIO INTRA EXTRACRAN SEL INTERNAL CAROTID UNI L MOD SED  09/27/2024   IR ANGIOGRAM FOLLOW UP STUDY  09/27/2024   IR ANGIOGRAM FOLLOW UP STUDY  09/27/2024   IR ANGIOGRAM FOLLOW UP STUDY  09/27/2024   IR ANGIOGRAM FOLLOW UP STUDY  09/27/2024   IR ANGIOGRAM FOLLOW UP STUDY  09/27/2024   IR INJECT/THERA/INC NEEDLE/CATH/PLC EPI/LUMB/SAC W/IMG  08/05/2021   IR TRANSCATH/EMBOLIZ  09/27/2024   IR US  GUIDE VASC ACCESS RIGHT  09/27/2024   IR US  GUIDE VASC ACCESS RIGHT  09/27/2024   RADIOLOGY WITH ANESTHESIA N/A 09/27/2024   Procedure: RADIOLOGY WITH ANESTHESIA;  Surgeon: Radiologist, Medication, MD;  Location: [facility] OR;  Service: Radiology;  Laterality:  N/A;   Patient Active Problem List   Diagnosis Date Noted   Mild intermittent asthma 11/06/2024   Primary generalized (osteo)arthritis 11/06/2024   Brain aneurysm 11/06/2024   SAH (subarachnoid hemorrhage) (HCC) 09/27/2024   Pain in right knee 07/29/2024   Obesity, unspecified 03/21/2024   Low back pain 02/05/2024   Pain of lumbar spine 12/29/2023   Peroneal tendinitis 12/27/2023   Vitamin D  deficiency 12/06/2022   H/O gastric sleeve 11/22/2022   Primary hypertension 11/22/2022   Class 3 severe obesity with serious comorbidity and body mass index (BMI) of 60.0 to 69.9 in adult (HCC) 10/27/2022   Prediabetes 10/27/2022   Major depressive disorder with single episode 06/13/2022   Pain in right foot 01/06/2022   Sprain of lateral ligament of ankle joint 01/06/2022   Lumbar radiculopathy 06/18/2021   Depressive disorder 10/24/1898   Fibromyalgia 10/24/1898   Hypertriglyceridemia 10/24/1898   Osteoarthritis 10/24/1898    ONSET DATE: 09/27/2024  REFERRING DIAG: I60.9 (ICD-10-CM) - SAH (subarachnoid hemorrhage) (HCC)  THERAPY DIAG:  Other symptoms and signs involving the nervous system  Muscle weakness (generalized)  Other abnormalities of gait and mobility  Unsteadiness on feet  Other low back pain  Rationale for Evaluation and Treatment: Rehabilitation  SUBJECTIVE:  SUBJECTIVE STATEMENT: Pt presents alone and ambulatory without AD to [facility] facility.  She reports same ongoing pain as evaluation but denies recent falls or near falls. Pt accompanied by: self (pt drives self)  PERTINENT HISTORY: sleep apnea, hypertension, obesity, prediabetes, depression, fibromyalgia, anemia, and osteoarthritis who [facility] ED after a sudden onset of a severe headache approximately around   5:30am on 09/27/2024  PAIN:  Are you having pain? Yes: NPRS scale: 6 Pain location: low back Pain description: variable - sharp shooting, throbbing, aching Aggravating factors: depends on how I move - pt unable to pinpoint one worst movement Relieving factors: medicines, aquatic therapy  PRECAUTIONS: Fall  RED FLAGS: None   WEIGHT BEARING RESTRICTIONS: No  FALLS: Has patient fallen in last 6 months? Yes. Number of falls 1 - slipped on carpet  LIVING ENVIRONMENT: Lives with: lives with their family (older sister) Lives in: House/apartment Stairs: Yes: External: 6 steps; on left going up Has following equipment at home: Walker - 2 wheeled  PLOF: Independent with basic ADLs, Independent with household mobility without device, Independent with transfers, and Needs assistance with homemaking  PATIENT GOALS: to have less pain and walk better  OBJECTIVE:  Note: Objective measures were completed at Evaluation unless otherwise noted.  L BP and HR prior to objectives: There were no vitals filed for this visit.  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 Mild interval improvement of subarachnoid hemorrhage, primarily within the interhemispheric fissure. 2. 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 3. No convincing evidence of cerebral infarction.  COGNITION: Overall cognitive status: Within functional limits for tasks assessed   SENSATION: Light touch: WFL  COORDINATION: BLE RAMS:  WFL  EDEMA:  She has some bil calf and foot swelling at baseline (prior to incident) - MD aware per pt  MUSCLE TONE: None noted in BLE  POSTURE: rounded shoulders, forward head, and posterior pelvic tilt  LOWER EXTREMITY ROM:     Active   Right Eval Left Eval  Hip flexion Grossly WFL; some limitation due to body habitus  Hip extension   Hip abduction   Hip adduction   Hip internal rotation   Hip external rotation   Knee flexion   Knee extension   Ankle dorsiflexion   Ankle plantarflexion   Ankle inversion    Ankle eversion     (Blank rows = not tested)  LOWER EXTREMITY MMT:    MMT Right Eval Left Eval  Hip flexion Grossly 3/5  Hip extension   Hip abduction   Hip adduction   Hip internal rotation   Hip external rotation   Knee flexion   Knee extension   Ankle dorsiflexion   Ankle plantarflexion   Ankle inversion    Ankle eversion    (Blank rows = not tested)  BED MOBILITY:  Findings: Reports independence in standard bed  TRANSFERS: Sit to stand: SBA  Assistive device utilized: None     Stand to sit: SBA  Assistive device utilized: None     Chair to chair: SBA  Assistive device utilized: None       RAMP:  Not tested  CURB:  Not tested  STAIRS: Findings: Level of Assistance: SBA, Stair Negotiation Technique: Step to Pattern Forwards with Bilateral Rails, Number of Stairs: 4,  Height of Stairs: 6 inch   , and Comments: pt leans and rotates trunk particularly on descent; left leading ascent and R leading descent  GAIT: Findings: Gait Characteristics: step to pattern, decreased arm swing- Right, decreased arm swing- Left, decreased step length- Right, decreased step length- Left, decreased stride length, decreased hip/knee flexion- Right, decreased hip/knee flexion- Left, decreased ankle dorsiflexion- Right, decreased ankle dorsiflexion- Left, and wide BOS, Distance walked: short clinic distances, Assistive device utilized:None, Level of assistance: SBA, and Comments: Pt has antalgic appearance of stepping w/ unsteady steps and intermittent stop/start pattern especially after prolonged sitting.  FUNCTIONAL TESTS:  Pt could not tolerate due to pain - shifting in seat throughout assessment  PATIENT  SURVEYS:  Modified Oswestry:  MODIFIED OSWESTRY DISABILITY SCALE  Date: 10/29/2024 Score  Pain intensity 3 =  Pain medication provides me with moderate relief from pain.  2. Personal care (washing, dressing, etc.) 2 =  It is painful to take care of myself, and I am slow and careful.  3. Lifting 4 = I can lift only very light weights  4. Walking 3 =  Pain prevents me from walking more than  mile.  5. Sitting 2 =  Pain prevents me from sitting more than 1 hour.  6. Standing 3 =  Pain prevents me from standing more than 1/2 hour.  7. Sleeping 0 = Pain does not prevent me from sleeping well.  8. Social Life 3 =  Pain prevents me from going out very often.  9. Traveling 3 = My pain restricts my travel over 1 hour  10. Employment/ Homemaking 2 = I can perform most of my homemaking/job duties, but pain prevents me from performing more physically stressful activities (eg, lifting, vacuuming).  Total 25/50   Interpretation of scores: Score Category Description  0-20% Minimal Disability The patient can cope with most living activities. Usually no treatment is indicated apart from advice on lifting, sitting and exercise  21-40% Moderate Disability The patient experiences more pain and difficulty with sitting, lifting and standing. Travel and social life are more difficult and they may be disabled from work. Personal care, sexual activity and sleeping are not grossly affected, and the patient can usually be managed by conservative means  41-60% Severe Disability Pain remains the main problem in this group, but activities of daily living are affected. These patients require a detailed investigation  61-80% Crippled Back pain impinges on all aspects of the patients life. Positive intervention is required  81-100% Bed-bound These patients are either bed-bound or exaggerating their symptoms  Bluford FORBES Zoe DELENA Karon DELENA, et al. Surgery versus conservative management of stable thoracolumbar fracture: the  PRESTO feasibility RCT. [place]): [employer]; 2021 Nov. [facility] Technology Assessment, No. 25.62.) Appendix 3, Oswestry Disability Index category descriptors. Available from: Findjewelers.cz  Minimally Clinically Important Difference (MCID) = 12.8%  TREATMENT DATE: 11/07/2024  Aquatic therapy at [facility] - pool temperature 92 degrees   Patient seen for aquatic therapy today.  Treatment took place in water 3.6-4.8 feet deep depending upon activity.  Patient entered and exited the pool via stairs at step to pattern and rail at mod I level.   Exercises: Water walking warmup 4x18 ft forward > backward > laterally unsupported  Core/static balance circuit (2x10 each):  -low resistance DB shoulder flexion  -low resistance DB should adduction/abduction  -low resistance DB chest press  -STS w/ low resistance DB pushdown -Seated bicycle kicks w/ DB for balance x1 minute -Seated unsupported hip abduction/adduction 2x10 using DB for balance -Prone floating w/ bil DB support flutter kicks 4x45 sec -Modified plantigrade kickbacks x20 alt LE; cues to decrease ROM  Ended session w/ forward walking unsupported and gentle floating for CNS regulation and pain management.  Patient requires buoyancy of the water for support for reduced fall risk with gait training and balance exercises with return demonstration, verbal cuing and SBA support. Exercises able to be performed safely in water without the risk of fall compared to those same exercises performed on land; viscosity of water needed for resistance for strengthening. Current of water provides perturbations for challenging static and dynamic balance.   PATIENT EDUCATION: Education details: Aquatic rationale. Person educated: Patient Education method: Software Engineer Education comprehension: verbalized understanding and returned demonstration  HOME EXERCISE PROGRAM: To be established.  GOALS: Goals reviewed with patient? Yes  SHORT TERM GOALS: Target date: 11/29/2024  Pt will be independent and compliant with introductory aquatic based HEP for strength and balance in order to maintain functional progress and improve mobility. Baseline:  To be established. Goal status: INITIAL  LONG TERM GOALS: Target date: 12/27/2024  Pt will be independent and compliant with advanced and finalized aquatic based HEP for strength and balance in order to maintain functional progress and improve mobility. Baseline: To be established. Goal status: INITIAL  2.  Pt will improve modified Oswestry score to </=15/50 in order to demonstrate improved pain management and functional capacity. Baseline: 25/50 Goal status: INITIAL  3.  Pt will navigate >/=6 stairs w/ bilateral hand support at modified independent level using step through pattern and improve trunk posture to demonstrate decreased fall risk, increased ability to access home environment, and increased functional strength. Baseline: 4 steps bil rails w/ trunk lean step to pattern SBA Goal status: INITIAL  ASSESSMENT:  CLINICAL IMPRESSION: Focus of skilled PT session today on initiating gentle mobility in aquatic setting.  Pt does well with only mild increase in R knee and low back pain throughout that was well managed with floating, seated recovery, and walking.  She continues to benefit from physical therapy interventions in this setting to improve confidence in mobility, gait mechanics, and dynamic stability.  Continue per POC.  OBJECTIVE IMPAIRMENTS: Abnormal gait, decreased activity tolerance, decreased balance, decreased endurance, decreased knowledge of use of DME, decreased mobility, difficulty walking, decreased strength, increased edema, improper body mechanics, postural dysfunction, obesity,  and pain.   ACTIVITY LIMITATIONS: carrying, lifting, bending, sitting, standing, squatting, stairs, transfers, bathing, and locomotion level  PARTICIPATION LIMITATIONS: meal prep, cleaning, laundry, shopping, community activity, and occupation  PERSONAL FACTORS: Fitness, Past/current experiences, Time since onset of injury/illness/exacerbation, and 1-2 comorbidities: HTN, obesity are also affecting patient's functional outcome.   REHAB POTENTIAL: Good  CLINICAL DECISION MAKING: Evolving/moderate complexity  EVALUATION COMPLEXITY: Moderate  PLAN:  PT FREQUENCY: 1x/week  PT DURATION: 8 weeks  PLANNED  INTERVENTIONS: 97164- PT Re-evaluation, 97750- Physical Performance Testing, 97110-Therapeutic exercises, 97530- Therapeutic activity, 97112- Neuromuscular re-education, [phone]- Self Care, [id number]- Manual therapy, [phone]- Gait training, [phone]- Aquatic Therapy, Patient/Family education, Balance training, Stair training, Joint mobilization, Spinal mobilization, Vestibular training, DME instructions, Cryotherapy, and Moist heat  PLAN FOR NEXT SESSION: Initiate aquatic HEP and Ai Chi for balance, LE strength, core and shoulder strength, stair training, STS, gait  Check all possible CPT codes: See Planned Interventions List for Planned CPT Codes    Check all conditions that are expected to impact treatment: Morbid obesity and Neurological condition and/or seizures   If treatment provided at initial evaluation, no treatment charged due to lack of authorization.   Daved KATHEE Bull, PT, DPT 11/07/2024, 10:21 AM        "

## 2024-11-07 NOTE — Progress Notes (Signed)
 Complex Care Management Care Guide Note  11/07/2024 Name: [name] MRN: [id number] DOB: September 14, 1971  Ashlee Gordon is a 54 y.o. year old female who is a primary care patient of [facility] and is actively engaged with the care management team. I reached out to Anavey Vice by phone today to assist with re-scheduling  with the BSW.  Follow up plan: Telephone appointment with complex care management team member scheduled for:  11/18/2024  Thedford Franks, CMA, [facility]  [facility], [facility] Guide, Lead Direct Dial: [phone]  Fax: [phone]

## 2024-11-07 NOTE — Addendum Note (Signed)
 Addended by: Kyandre Okray on: 11/07/2024 12:57 PM   Modules accepted: Orders

## 2024-11-07 NOTE — Addendum Note (Signed)
 Addended by: Jaxyn Rout on: 11/07/2024 03:52 PM   Modules accepted: Orders

## 2024-11-14 ENCOUNTER — Encounter: Payer: Self-pay | Admitting: Physical Therapy

## 2024-11-14 ENCOUNTER — Ambulatory Visit: Admitting: Physical Therapy

## 2024-11-14 DIAGNOSIS — M6281 Muscle weakness (generalized): Secondary | ICD-10-CM

## 2024-11-14 DIAGNOSIS — R2689 Other abnormalities of gait and mobility: Secondary | ICD-10-CM | POA: Diagnosis not present

## 2024-11-14 DIAGNOSIS — R29818 Other symptoms and signs involving the nervous system: Secondary | ICD-10-CM

## 2024-11-14 DIAGNOSIS — M5459 Other low back pain: Secondary | ICD-10-CM

## 2024-11-14 DIAGNOSIS — R2681 Unsteadiness on feet: Secondary | ICD-10-CM

## 2024-11-14 NOTE — Addendum Note (Signed)
 Addended by: Zamira Hickam on: 11/14/2024 03:03 PM   Modules accepted: Orders

## 2024-11-14 NOTE — Therapy (Signed)
 " OUTPATIENT PHYSICAL THERAPY NEURO TREATMENT   Patient Name: Ashlee Gordon MRN: [id number] DOB:1971-10-13, 54 y.o., female Today's Date: 11/14/2024   PCP: [facility] (pt is looking into PCP at [facility] as her prior PCP just left practice) REFERRING PROVIDER: Perri DELENA Meliton Mickey., MD  END OF SESSION:  PT End of Session - 11/14/24 0856     Visit Number 3    Number of Visits 9   8 + eval   Date for Recertification  01/03/25   pushed out due to potential scheduling delay   Authorization Type [place] [employer]    Progress Note Due on Visit 10    PT Start Time 0845    PT Stop Time 0929    PT Time Calculation (min) 44 min    Equipment Utilized During Treatment Other (comment)   Aquatic devices as needed for safety and challenge   Activity Tolerance Patient tolerated treatment well    Behavior During Therapy WFL for tasks assessed/performed          Past Medical History:  Diagnosis Date   Anemia    Depression    Diabetes mellitus without complication (HCC)    Fibromyalgia    Hypertension    Infertility, female    Morbid obesity (HCC)    OSA (obstructive sleep apnea)    Osteoarthritis    SAH (subarachnoid hemorrhage) (HCC) 09/27/2024   SOBOE (shortness of breath on exertion)    Swelling of both lower extremities    Vitamin D  deficiency    Past Surgical History:  Procedure Laterality Date   IR ANGIO INTRA EXTRACRAN SEL INTERNAL CAROTID UNI L MOD SED  09/27/2024   IR ANGIOGRAM FOLLOW UP STUDY  09/27/2024   IR ANGIOGRAM FOLLOW UP STUDY  09/27/2024   IR ANGIOGRAM FOLLOW UP STUDY  09/27/2024   IR ANGIOGRAM FOLLOW UP STUDY  09/27/2024   IR ANGIOGRAM FOLLOW UP STUDY  09/27/2024   IR INJECT/THERA/INC NEEDLE/CATH/PLC EPI/LUMB/SAC W/IMG  08/05/2021   IR TRANSCATH/EMBOLIZ  09/27/2024   IR US  GUIDE VASC ACCESS RIGHT  09/27/2024   IR US  GUIDE VASC ACCESS RIGHT  09/27/2024   RADIOLOGY WITH ANESTHESIA N/A 09/27/2024   Procedure: RADIOLOGY WITH ANESTHESIA;  Surgeon: Radiologist,  Medication, MD;  Location: [facility] OR;  Service: Radiology;  Laterality: N/A;   Patient Active Problem List   Diagnosis Date Noted   Mild intermittent asthma 11/06/2024   Primary generalized (osteo)arthritis 11/06/2024   Brain aneurysm 11/06/2024   SAH (subarachnoid hemorrhage) (HCC) 09/27/2024   Pain in right knee 07/29/2024   Obesity, unspecified 03/21/2024   Low back pain 02/05/2024   Pain of lumbar spine 12/29/2023   Peroneal tendinitis 12/27/2023   Vitamin D  deficiency 12/06/2022   H/O gastric sleeve 11/22/2022   Primary hypertension 11/22/2022   Class 3 severe obesity with serious comorbidity and body mass index (BMI) of 60.0 to 69.9 in adult (HCC) 10/27/2022   Prediabetes 10/27/2022   Major depressive disorder with single episode 06/13/2022   Pain in right foot 01/06/2022   Sprain of lateral ligament of ankle joint 01/06/2022   Lumbar radiculopathy 06/18/2021   Depressive disorder 10/24/1898   Fibromyalgia 10/24/1898   Hypertriglyceridemia 10/24/1898   Osteoarthritis 10/24/1898    ONSET DATE: 09/27/2024  REFERRING DIAG: I60.9 (ICD-10-CM) - SAH (subarachnoid hemorrhage) (HCC)  THERAPY DIAG:  Other symptoms and signs involving the nervous system  Muscle weakness (generalized)  Other abnormalities of gait and mobility  Unsteadiness on feet  Other low back pain  Rationale  for Evaluation and Treatment: Rehabilitation  SUBJECTIVE:                                                                                                                                                                                             SUBJECTIVE STATEMENT: Pt received already in the pool prior to appt time or PT arrival.  She reports same ongoing pain as evaluation but denies recent falls or near falls. Pt accompanied by: self (pt drives self)  PERTINENT HISTORY: sleep apnea, hypertension, obesity, prediabetes, depression, fibromyalgia, anemia, and osteoarthritis who [facility] after a sudden onset of a severe headache approximately around  5:30am on 09/27/2024  PAIN:  Are you having pain? Yes: NPRS scale: 6 Pain location: low back Pain description: variable - sharp shooting, throbbing, aching Aggravating factors: depends on how I move - pt unable to pinpoint one worst movement Relieving factors: medicines, aquatic therapy  PRECAUTIONS: Fall  RED FLAGS: None   WEIGHT BEARING RESTRICTIONS: No  FALLS: Has patient fallen in last 6 months? Yes. Number of falls 1 - slipped on carpet  LIVING ENVIRONMENT: Lives with: lives with their family (older sister) Lives in: House/apartment Stairs: Yes: External: 6 steps; on left going up Has following equipment at home: Walker - 2 wheeled  PLOF: Independent with basic ADLs, Independent with household mobility without device, Independent with transfers, and Needs assistance with homemaking  PATIENT GOALS: to have less pain and walk better  OBJECTIVE:  Note: Objective measures were completed at Evaluation unless otherwise noted.  L BP and HR prior to objectives: There were no vitals filed for this visit.  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 Mild interval improvement of subarachnoid hemorrhage, primarily within the interhemispheric fissure. 2. 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 3. No convincing evidence of cerebral infarction.  COGNITION: Overall cognitive status: Within functional limits for tasks assessed   SENSATION: Light touch: WFL  COORDINATION: BLE RAMS:  WFL  EDEMA:  She has some bil calf and foot swelling at baseline (prior to incident) - MD aware per pt  MUSCLE TONE: None noted in BLE  POSTURE: rounded shoulders, forward head,  and posterior pelvic tilt  LOWER EXTREMITY ROM:     Active  Right Eval Left Eval  Hip flexion Grossly WFL; some limitation due to body habitus  Hip extension   Hip abduction   Hip adduction  Hip internal rotation   Hip external rotation   Knee flexion   Knee extension   Ankle dorsiflexion   Ankle plantarflexion   Ankle inversion    Ankle eversion     (Blank rows = not tested)  LOWER EXTREMITY MMT:    MMT Right Eval Left Eval  Hip flexion Grossly 3/5  Hip extension   Hip abduction   Hip adduction   Hip internal rotation   Hip external rotation   Knee flexion   Knee extension   Ankle dorsiflexion   Ankle plantarflexion   Ankle inversion    Ankle eversion    (Blank rows = not tested)  BED MOBILITY:  Findings: Reports independence in standard bed  TRANSFERS: Sit to stand: SBA  Assistive device utilized: None     Stand to sit: SBA  Assistive device utilized: None     Chair to chair: SBA  Assistive device utilized: None       RAMP:  Not tested  CURB:  Not tested  STAIRS: Findings: Level of Assistance: SBA, Stair Negotiation Technique: Step to Pattern Forwards with Bilateral Rails, Number of Stairs: 4, Height of Stairs: 6 inch   , and Comments: pt leans and rotates trunk particularly on descent; left leading ascent and R leading descent  GAIT: Findings: Gait Characteristics: step to pattern, decreased arm swing- Right, decreased arm swing- Left, decreased step length- Right, decreased step length- Left, decreased stride length, decreased hip/knee flexion- Right, decreased hip/knee flexion- Left, decreased ankle dorsiflexion- Right, decreased ankle dorsiflexion- Left, and wide BOS, Distance walked: short clinic distances, Assistive device utilized:None, Level of assistance: SBA, and Comments: Pt has antalgic appearance of stepping w/ unsteady steps and intermittent stop/start pattern especially after prolonged sitting.  FUNCTIONAL TESTS:  Pt could not tolerate  due to pain - shifting in seat throughout assessment  PATIENT SURVEYS:  Modified Oswestry:  MODIFIED OSWESTRY DISABILITY SCALE  Date: 10/29/2024 Score  Pain intensity 3 =  Pain medication provides me with moderate relief from pain.  2. Personal care (washing, dressing, etc.) 2 =  It is painful to take care of myself, and I am slow and careful.  3. Lifting 4 = I can lift only very light weights  4. Walking 3 =  Pain prevents me from walking more than  mile.  5. Sitting 2 =  Pain prevents me from sitting more than 1 hour.  6. Standing 3 =  Pain prevents me from standing more than 1/2 hour.  7. Sleeping 0 = Pain does not prevent me from sleeping well.  8. Social Life 3 =  Pain prevents me from going out very often.  9. Traveling 3 = My pain restricts my travel over 1 hour  10. Employment/ Homemaking 2 = I can perform most of my homemaking/job duties, but pain prevents me from performing more physically stressful activities (eg, lifting, vacuuming).  Total 25/50   Interpretation of scores: Score Category Description  0-20% Minimal Disability The patient can cope with most living activities. Usually no treatment is indicated apart from advice on lifting, sitting and exercise  21-40% Moderate Disability The patient experiences more pain and difficulty with sitting, lifting and standing. Travel and social life are more difficult and they may be disabled from work. Personal care, sexual activity and sleeping are not grossly affected, and the patient can usually be managed by conservative means  41-60% Severe Disability Pain remains the main problem in this group, but activities of daily living are affected.  These patients require a detailed investigation  61-80% Crippled Back pain impinges on all aspects of the patients life. Positive intervention is required  81-100% Bed-bound These patients are either bed-bound or exaggerating their symptoms  Bluford FORBES Zoe DELENA Karon DELENA, et al. Surgery versus  conservative management of stable thoracolumbar fracture: the PRESTO feasibility RCT. [place] [employer]; 2021 Nov. [facility] Technology Assessment, No. 25.62.) Appendix 3, Oswestry Disability Index category descriptors. Available from: Findjewelers.cz  Minimally Clinically Important Difference (MCID) = 12.8%                                                                                                                              TREATMENT DATE: 11/14/2024  Aquatic therapy at [facility] - pool temperature 90 degrees   Patient seen for aquatic therapy today.  Treatment took place in water 3.6-4.8 feet deep depending upon activity.  Patient entered and exited the pool via stairs at step to pattern and rail at mod I level.   Exercises: Water walking warmup 4x18 ft forward > backward > laterally unsupported  Core/static balance:  -Pedaling on noodle x1 minute  -Pedaling across pool on noodle 4x18 ft  -Swing sit march on noodle 3x20, some LBP that resolves w/ rest  -Swing sit on noodle w/ low resistance chest press x20  -Swing sit on noodle w/ low resistance push down x20  -Swing sit forward rowing in place x1 minute  -5 step taps 2x20 -Tricep prop for flutter kicks (heel supported) 2x20 > ER marches 2x20 -Walking in deep end w/ paced breathing for relaxation  Ended session w/ forward walking unsupported and gentle floating for CNS regulation and pain management - also used during rest periods.  Patient requires buoyancy of the water for support for reduced fall risk with gait training and balance exercises with return demonstration, verbal cuing and SBA support. Exercises able to be performed safely in water without the risk of fall compared to those same exercises performed on land; viscosity of water needed for resistance for strengthening. Current of water provides perturbations for challenging static and dynamic balance.   PATIENT  EDUCATION: Education details: Aquatic rationale. Person educated: Patient Education method: Medical Illustrator Education comprehension: verbalized understanding and returned demonstration  HOME EXERCISE PROGRAM: To be established.  GOALS: Goals reviewed with patient? Yes  SHORT TERM GOALS: Target date: 11/29/2024  Pt will be independent and compliant with introductory aquatic based HEP for strength and balance in order to maintain functional progress and improve mobility. Baseline:  To be established. Goal status: INITIAL  LONG TERM GOALS: Target date: 12/27/2024  Pt will be independent and compliant with advanced and finalized aquatic based HEP for strength and balance in order to maintain functional progress and improve mobility. Baseline: To be established. Goal status: INITIAL  2.  Pt will improve modified Oswestry score to </=15/50 in order to demonstrate improved pain management and functional capacity. Baseline: 25/50 Goal status: INITIAL  3.  Pt will navigate >/=6 stairs w/ bilateral hand support at modified independent level using step through pattern and improve trunk posture to demonstrate decreased fall risk, increased ability to access home environment, and increased functional strength. Baseline: 4 steps bil rails w/ trunk lean step to pattern SBA Goal status: INITIAL  ASSESSMENT:  CLINICAL IMPRESSION: Focus of skilled PT session today on continuing pain modulation and core strengthening in aquatic setting.  She has improved engagement of lower core to all tasks this visit with seemingly milder pain response during tasks.  She self-initiate rest well and re-initiates activity appropriately.  She remains motivated in this setting and is appropriate for ai chi review and establishment of home aquatic based program in future.  Continue per POC.  OBJECTIVE IMPAIRMENTS: Abnormal gait, decreased activity tolerance, decreased balance, decreased endurance, decreased  knowledge of use of DME, decreased mobility, difficulty walking, decreased strength, increased edema, improper body mechanics, postural dysfunction, obesity, and pain.   ACTIVITY LIMITATIONS: carrying, lifting, bending, sitting, standing, squatting, stairs, transfers, bathing, and locomotion level  PARTICIPATION LIMITATIONS: meal prep, cleaning, laundry, shopping, community activity, and occupation  PERSONAL FACTORS: Fitness, Past/current experiences, Time since onset of injury/illness/exacerbation, and 1-2 comorbidities: HTN, obesity are also affecting patient's functional outcome.   REHAB POTENTIAL: Good  CLINICAL DECISION MAKING: Evolving/moderate complexity  EVALUATION COMPLEXITY: Moderate  PLAN:  PT FREQUENCY: 1x/week  PT DURATION: 8 weeks  PLANNED INTERVENTIONS: 97164- PT Re-evaluation, 97750- Physical Performance Testing, 97110-Therapeutic exercises, 97530- Therapeutic activity, 97112- Neuromuscular re-education, 97535- Self Care, [id number]- Manual therapy, [phone]- Gait training, [phone]- Aquatic Therapy, Patient/Family education, Balance training, Stair training, Joint mobilization, Spinal mobilization, Vestibular training, DME instructions, Cryotherapy, and Moist heat  PLAN FOR NEXT SESSION: Initiate aquatic HEP and Ai Chi for balance, LE strength, core and shoulder strength, stair training, STS, gait  Check all possible CPT codes: See Planned Interventions List for Planned CPT Codes    Check all conditions that are expected to impact treatment: Morbid obesity and Neurological condition and/or seizures   If treatment provided at initial evaluation, no treatment charged due to lack of authorization.   Daved KATHEE Bull, PT, DPT 11/14/2024, 9:31 AM        "

## 2024-11-14 NOTE — Addendum Note (Signed)
 Addended by: Jenny Lai on: 11/14/2024 02:26 PM   Modules accepted: Orders

## 2024-11-18 ENCOUNTER — Other Ambulatory Visit: Payer: Self-pay

## 2024-11-18 NOTE — Patient Instructions (Addendum)
 Erroneous Encounter

## 2024-11-18 NOTE — Patient Outreach (Signed)
 Social Drivers of Health  Community Resource and Care Coordination Visit Note   11/18/2024  Name: Ashlee Gordon MRN: [id number] DOB:1971-02-17  Situation: Referral received for [facility] needs assessment and assistance related to Housing . I obtained verbal consent from Patient.  Visit completed with Patient on the phone.   Background:   SDOH Interventions Today    Flowsheet Row Most Recent Value  SDOH Interventions   Financial Strain Interventions Other (Comment)  [Struggling with rent as she has been out of work due to medical issues. Pt plans to return to work soon.  Only income $432 SSI.]     Assessment:   Goals Addressed             This Visit's Progress    BSW Goals       Current SDOH Barriers:  Housing barriers  Interventions: Patient interviewed and appropriate screenings performed Referred patient to community resources  Provided patient with information about housing options and [employer] and [employer] Advised patient to contact community resources for assistance.  Pt has already contact [employer] and was not eligible. Sister lives in the home and her daughter helps with rent.  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  Pt plans to return to work soon. Currently patient only receives SSI $432 and reported income change to [facility] to increase benefits.  Check is also smaller because of an overpayment ($6000 balance).  Pt feels she can manage bills in March.  SW does educate patient that she will have to show how she can manage on-going to obtain assistance. Closed          Recommendation:   follow up with online housing search, [facility] [employer] and [employer] regarding housing needs  Follow Up Plan:   Patient has achieved all patient stated goals. [employer] will be closed. Patient has been provided contact information should new needs arise.   Tillman Gardener,  BSW [place]  [facility], [facility] Social Worker Direct Dial: [phone]  Fax: [phone] Website: [email or web address]

## 2024-11-18 NOTE — Patient Instructions (Signed)
 Visit Information  Thank you for taking time to visit with me today. Please don't hesitate to contact me if I can be of assistance to you before our next scheduled appointment.   Following is a copy of your care plan:   Goals Addressed             This Visit's Progress    BSW Goals       Current SDOH Barriers:  Housing barriers  Interventions: Patient interviewed and appropriate screenings performed Referred patient to community resources  Provided patient with information about housing options and [employer] and [employer] Advised patient to contact community resources for assistance.  Pt has already contact [employer] and was not eligible. Sister lives in the home and her daughter helps with rent.  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  Pt plans to return to work soon. Currently patient only receives SSI $432 and reported income change to [facility] to increase benefits.  Check is also smaller because of an overpayment ($6000 balance).  Pt feels she can manage bills in March.  SW does educate patient that she will have to show how she can manage on-going to obtain assistance. Closed          Please call 911 if you are experiencing a Mental Health or Behavioral Health Crisis or need someone to talk to.  Patient verbalized understanding of Care plan and visit instructions communicated this visit  Tillman Gardener, BSW [place]  [facility], [facility] Social Worker Direct Dial: [phone]  Fax: [phone] Website: [email or web address]

## 2024-11-21 ENCOUNTER — Ambulatory Visit: Admitting: Physical Therapy

## 2024-11-21 ENCOUNTER — Encounter: Payer: Self-pay | Admitting: Physical Therapy

## 2024-11-21 DIAGNOSIS — R29818 Other symptoms and signs involving the nervous system: Secondary | ICD-10-CM

## 2024-11-21 DIAGNOSIS — R2689 Other abnormalities of gait and mobility: Secondary | ICD-10-CM

## 2024-11-21 DIAGNOSIS — M6281 Muscle weakness (generalized): Secondary | ICD-10-CM

## 2024-11-21 DIAGNOSIS — R2681 Unsteadiness on feet: Secondary | ICD-10-CM

## 2024-11-21 DIAGNOSIS — M5459 Other low back pain: Secondary | ICD-10-CM

## 2024-11-21 NOTE — Therapy (Signed)
 " OUTPATIENT PHYSICAL THERAPY NEURO TREATMENT   Patient Name: Ashlee Gordon MRN: [id number] DOB:Jun 29, 1971, 54 y.o., female Today's Date: 11/21/2024   PCP: [facility] (pt is looking into PCP at [facility] as her prior PCP just left practice) REFERRING PROVIDER: Perri DELENA Meliton Mickey., MD  END OF SESSION:  PT End of Session - 11/21/24 1028     Visit Number 4    Number of Visits 9   8 + eval   Date for Recertification  01/03/25   pushed out due to potential scheduling delay   Authorization Type [place] [employer]    Progress Note Due on Visit 10    PT Start Time 1015    PT Stop Time 1100    PT Time Calculation (min) 45 min    Equipment Utilized During Treatment Other (comment)   Aquatic devices as needed for safety and challenge   Activity Tolerance Patient tolerated treatment well    Behavior During Therapy WFL for tasks assessed/performed          Past Medical History:  Diagnosis Date   Anemia    Depression    Diabetes mellitus without complication (HCC)    Fibromyalgia    Hypertension    Infertility, female    Morbid obesity (HCC)    OSA (obstructive sleep apnea)    Osteoarthritis    SAH (subarachnoid hemorrhage) (HCC) 09/27/2024   SOBOE (shortness of breath on exertion)    Swelling of both lower extremities    Vitamin D  deficiency    Past Surgical History:  Procedure Laterality Date   IR ANGIO INTRA EXTRACRAN SEL INTERNAL CAROTID UNI L MOD SED  09/27/2024   IR ANGIOGRAM FOLLOW UP STUDY  09/27/2024   IR ANGIOGRAM FOLLOW UP STUDY  09/27/2024   IR ANGIOGRAM FOLLOW UP STUDY  09/27/2024   IR ANGIOGRAM FOLLOW UP STUDY  09/27/2024   IR ANGIOGRAM FOLLOW UP STUDY  09/27/2024   IR INJECT/THERA/INC NEEDLE/CATH/PLC EPI/LUMB/SAC W/IMG  08/05/2021   IR TRANSCATH/EMBOLIZ  09/27/2024   IR US  GUIDE VASC ACCESS RIGHT  09/27/2024   IR US  GUIDE VASC ACCESS RIGHT  09/27/2024   RADIOLOGY WITH ANESTHESIA N/A 09/27/2024   Procedure: RADIOLOGY WITH ANESTHESIA;  Surgeon: Radiologist,  Medication, MD;  Location: [facility] OR;  Service: Radiology;  Laterality: N/A;   Patient Active Problem List   Diagnosis Date Noted   Mild intermittent asthma 11/06/2024   Primary generalized (osteo)arthritis 11/06/2024   Brain aneurysm 11/06/2024   SAH (subarachnoid hemorrhage) (HCC) 09/27/2024   Pain in right knee 07/29/2024   Obesity, unspecified 03/21/2024   Low back pain 02/05/2024   Pain of lumbar spine 12/29/2023   Peroneal tendinitis 12/27/2023   Vitamin D  deficiency 12/06/2022   H/O gastric sleeve 11/22/2022   Primary hypertension 11/22/2022   Class 3 severe obesity with serious comorbidity and body mass index (BMI) of 60.0 to 69.9 in adult (HCC) 10/27/2022   Prediabetes 10/27/2022   Major depressive disorder with single episode 06/13/2022   Pain in right foot 01/06/2022   Sprain of lateral ligament of ankle joint 01/06/2022   Lumbar radiculopathy 06/18/2021   Depressive disorder 10/24/1898   Fibromyalgia 10/24/1898   Hypertriglyceridemia 10/24/1898   Osteoarthritis 10/24/1898    ONSET DATE: 09/27/2024  REFERRING DIAG: I60.9 (ICD-10-CM) - SAH (subarachnoid hemorrhage) (HCC)  THERAPY DIAG:  Other symptoms and signs involving the nervous system  Muscle weakness (generalized)  Other abnormalities of gait and mobility  Unsteadiness on feet  Other low back pain  Rationale  for Evaluation and Treatment: Rehabilitation  SUBJECTIVE:                                                                                                                                                                                             SUBJECTIVE STATEMENT: Pt received already in the pool prior to appt time or PT arrival.  She reports same ongoing pain as prior but denies recent falls or near falls. Pt accompanied by: self (pt drives self)  PERTINENT HISTORY: sleep apnea, hypertension, obesity, prediabetes, depression, fibromyalgia, anemia, and osteoarthritis who presented [facility]  ED after a sudden onset of a severe headache approximately around  5:30am on 09/27/2024  PAIN:  Are you having pain? Yes: NPRS scale: 5-6 Pain location: low back and right shoulder Pain description: variable - sharp shooting, throbbing, aching Aggravating factors: depends on how I move - pt unable to pinpoint one worst movement Relieving factors: medicines, aquatic therapy  PRECAUTIONS: Fall  RED FLAGS: None   WEIGHT BEARING RESTRICTIONS: No  FALLS: Has patient fallen in last 6 months? Yes. Number of falls 1 - slipped on carpet  LIVING ENVIRONMENT: Lives with: lives with their family (older sister) Lives in: House/apartment Stairs: Yes: External: 6 steps; on left going up Has following equipment at home: Walker - 2 wheeled  PLOF: Independent with basic ADLs, Independent with household mobility without device, Independent with transfers, and Needs assistance with homemaking  PATIENT GOALS: to have less pain and walk better  OBJECTIVE:  Note: Objective measures were completed at Evaluation unless otherwise noted.  L BP and HR prior to objectives: There were no vitals filed for this visit.  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 Mild interval improvement of subarachnoid hemorrhage, primarily within the interhemispheric fissure. 2. 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 3. No convincing evidence of cerebral infarction.  COGNITION: Overall cognitive status: Within functional limits for tasks assessed   SENSATION: Light touch: WFL  COORDINATION: BLE RAMS:  WFL  EDEMA:  She has some bil calf and foot swelling at baseline (prior to incident) - MD aware per pt  MUSCLE TONE: None noted in BLE  POSTURE: rounded  shoulders, forward head, and posterior pelvic tilt  LOWER EXTREMITY ROM:     Active  Right Eval Left Eval  Hip flexion Grossly WFL; some limitation due to body habitus  Hip extension   Hip abduction  Hip adduction   Hip internal rotation   Hip external rotation   Knee flexion   Knee extension   Ankle dorsiflexion   Ankle plantarflexion   Ankle inversion    Ankle eversion     (Blank rows = not tested)  LOWER EXTREMITY MMT:    MMT Right Eval Left Eval  Hip flexion Grossly 3/5  Hip extension   Hip abduction   Hip adduction   Hip internal rotation   Hip external rotation   Knee flexion   Knee extension   Ankle dorsiflexion   Ankle plantarflexion   Ankle inversion    Ankle eversion    (Blank rows = not tested)  BED MOBILITY:  Findings: Reports independence in standard bed  TRANSFERS: Sit to stand: SBA  Assistive device utilized: None     Stand to sit: SBA  Assistive device utilized: None     Chair to chair: SBA  Assistive device utilized: None       RAMP:  Not tested  CURB:  Not tested  STAIRS: Findings: Level of Assistance: SBA, Stair Negotiation Technique: Step to Pattern Forwards with Bilateral Rails, Number of Stairs: 4, Height of Stairs: 6 inch   , and Comments: pt leans and rotates trunk particularly on descent; left leading ascent and R leading descent  GAIT: Findings: Gait Characteristics: step to pattern, decreased arm swing- Right, decreased arm swing- Left, decreased step length- Right, decreased step length- Left, decreased stride length, decreased hip/knee flexion- Right, decreased hip/knee flexion- Left, decreased ankle dorsiflexion- Right, decreased ankle dorsiflexion- Left, and wide BOS, Distance walked: short clinic distances, Assistive device utilized:None, Level of assistance: SBA, and Comments: Pt has antalgic appearance of stepping w/ unsteady steps and intermittent stop/start pattern especially after prolonged sitting.  FUNCTIONAL TESTS:   Pt could not tolerate due to pain - shifting in seat throughout assessment  PATIENT SURVEYS:  Modified Oswestry:  MODIFIED OSWESTRY DISABILITY SCALE  Date: 10/29/2024 Score  Pain intensity 3 =  Pain medication provides me with moderate relief from pain.  2. Personal care (washing, dressing, etc.) 2 =  It is painful to take care of myself, and I am slow and careful.  3. Lifting 4 = I can lift only very light weights  4. Walking 3 =  Pain prevents me from walking more than  mile.  5. Sitting 2 =  Pain prevents me from sitting more than 1 hour.  6. Standing 3 =  Pain prevents me from standing more than 1/2 hour.  7. Sleeping 0 = Pain does not prevent me from sleeping well.  8. Social Life 3 =  Pain prevents me from going out very often.  9. Traveling 3 = My pain restricts my travel over 1 hour  10. Employment/ Homemaking 2 = I can perform most of my homemaking/job duties, but pain prevents me from performing more physically stressful activities (eg, lifting, vacuuming).  Total 25/50   Interpretation of scores: Score Category Description  0-20% Minimal Disability The patient can cope with most living activities. Usually no treatment is indicated apart from advice on lifting, sitting and exercise  21-40% Moderate Disability The patient experiences more pain and difficulty with sitting, lifting and standing. Travel and social life are more difficult and they may be disabled from work. Personal care, sexual activity and sleeping are not grossly affected, and the patient can usually be managed by conservative means  41-60% Severe Disability Pain remains the main problem in this group, but activities of  daily living are affected. These patients require a detailed investigation  61-80% Crippled Back pain impinges on all aspects of the patients life. Positive intervention is required  81-100% Bed-bound These patients are either bed-bound or exaggerating their symptoms  Bluford FORBES Zoe DELENA Karon DELENA,  et al. Surgery versus conservative management of stable thoracolumbar fracture: the PRESTO feasibility RCT. [place] ([place]): [employer]; 2021 Nov. [facility] Technology Assessment, No. 25.62.) Appendix 3, Oswestry Disability Index category descriptors. Available from: Findjewelers.cz  Minimally Clinically Important Difference (MCID) = 12.8%                                                                                                                              TREATMENT DATE: 11/21/2024  Aquatic therapy at [facility] - pool temperature 88 degrees   Patient seen for aquatic therapy today.  Treatment took place in water 3.6-4.8 feet deep depending upon activity.  Patient entered and exited the pool via stairs at step to pattern and rail at mod I level.   Exercises: Water walking warmup 4x18 ft forward > backward > laterally unsupported > repeated w/ large kickboard for resistance -Standing wide stance w/ large kickboard push/pull x30 -Large forward shoulder circles x20 > large backwards shoulder circles x20 -Body weight squats unsupported 3x10, encouraged prolonged rest as needed b/w sets -Diagonal shoulder paddling for core and shoulder ROM 2x20 alt directions -Lateral step taps w/ unilateral UE support 2x10 each side -Low back and hamstring stretch at steps - depth was too intense for pt and bending knees did not accommodate back stretch enough so utilized ladder hang from rails for improved stretch 2x30 sec, had pt walk and float during rest period to accommodate rebound pain in low back -Tandem forward and backward unsupported 2x18 ft each direction -Carioca 2x18 ft each direction unsupported  Ended session w/ forward walking unsupported and gentle floating for CNS regulation and pain management - also used during rest periods.  Patient requires buoyancy of the water for support for reduced fall risk with gait training and balance exercises with  return demonstration, verbal cuing and SBA support. Exercises able to be performed safely in water without the risk of fall compared to those same exercises performed on land; viscosity of water needed for resistance for strengthening. Current of water provides perturbations for challenging static and dynamic balance.   PATIENT EDUCATION: Education details: Aquatic rationale.  Speak to MD directly about return of pins/needles in left thigh.  Will establish aquatic HEP. Person educated: Patient Education method: Medical Illustrator Education comprehension: verbalized understanding and returned demonstration  HOME EXERCISE PROGRAM: To be established.  GOALS: Goals reviewed with patient? Yes  SHORT TERM GOALS: Target date: 11/29/2024  Pt will be independent and compliant with introductory aquatic based HEP for strength and balance in order to maintain functional progress and improve mobility. Baseline:  To be established. Goal status: INITIAL  LONG TERM GOALS: Target date: 12/27/2024  Pt will be  independent and compliant with advanced and finalized aquatic based HEP for strength and balance in order to maintain functional progress and improve mobility. Baseline: To be established. Goal status: INITIAL  2.  Pt will improve modified Oswestry score to </=15/50 in order to demonstrate improved pain management and functional capacity. Baseline: 25/50 Goal status: INITIAL  3.  Pt will navigate >/=6 stairs w/ bilateral hand support at modified independent level using step through pattern and improve trunk posture to demonstrate decreased fall risk, increased ability to access home environment, and increased functional strength. Baseline: 4 steps bil rails w/ trunk lean step to pattern SBA Goal status: INITIAL  ASSESSMENT:  CLINICAL IMPRESSION: Focus of skilled PT session today on core engagement and shoulder mobility.  Her shoulder gave her more pain initially in session but she did  well despite this.  She has expected discomfort with stretches today but this resolves to baseline prior to end of session.  She would benefit from review of ai Chi for HEP as she has discovered that her insurance will pay for membership at [facility] facility and she will be able to continue on her own at end of current POC.  Continue per POC.  OBJECTIVE IMPAIRMENTS: Abnormal gait, decreased activity tolerance, decreased balance, decreased endurance, decreased knowledge of use of DME, decreased mobility, difficulty walking, decreased strength, increased edema, improper body mechanics, postural dysfunction, obesity, and pain.   ACTIVITY LIMITATIONS: carrying, lifting, bending, sitting, standing, squatting, stairs, transfers, bathing, and locomotion level  PARTICIPATION LIMITATIONS: meal prep, cleaning, laundry, shopping, community activity, and occupation  PERSONAL FACTORS: Fitness, Past/current experiences, Time since onset of injury/illness/exacerbation, and 1-2 comorbidities: HTN, obesity are also affecting patient's functional outcome.   REHAB POTENTIAL: Good  CLINICAL DECISION MAKING: Evolving/moderate complexity  EVALUATION COMPLEXITY: Moderate  PLAN:  PT FREQUENCY: 1x/week  PT DURATION: 8 weeks  PLANNED INTERVENTIONS: 97164- PT Re-evaluation, 97750- Physical Performance Testing, 97110-Therapeutic exercises, 97530- Therapeutic activity, 97112- Neuromuscular re-education, 97535- Self Care, [id number]- Manual therapy, [phone]- Gait training, [phone]- Aquatic Therapy, Patient/Family education, Balance training, Stair training, Joint mobilization, Spinal mobilization, Vestibular training, DME instructions, Cryotherapy, and Moist heat  PLAN FOR NEXT SESSION: Initiate aquatic HEP and Ai Chi for balance, LE strength, core and shoulder strength, stair training, STS, gait  Check all possible CPT codes: See Planned Interventions List for Planned CPT Codes    Check all conditions that are expected to  impact treatment: Morbid obesity and Neurological condition and/or seizures   If treatment provided at initial evaluation, no treatment charged due to lack of authorization.   Daved KATHEE Bull, PT, DPT 11/21/2024, 11:10 AM        "

## 2024-11-25 ENCOUNTER — Telehealth: Payer: Self-pay

## 2024-11-25 NOTE — Progress Notes (Signed)
 Complex Care Management Note  Care Guide Note 11/25/2024 Name: Alliya Marcon MRN: [id number] DOB: December 03, 1970  Johnnie Moten is a 54 y.o. year old female who [facility] for [facility]. I reached out to Aaralynn Schonberg by phone today to offer complex care management services.  Ms. Hanlon was given information about Complex Care Management services today including:   The Complex Care Management services include support from the care team which includes your Nurse Care Manager, Clinical Social Worker, or Pharmacist.  The Complex Care Management team is here to help remove barriers to the health concerns and goals most important to you. Complex Care Management services are voluntary, and the patient may decline or stop services at any time by request to their care team member.   Complex Care Management Consent Status: Patient agreed to services and verbal consent obtained.   Follow up plan:  Telephone appointment with complex care management team member scheduled for:  Received a referral to reschedule initial appt on 11/18/24. Patient stated she completed her appt on 11/18/24.  Encounter Outcome:  Patient Visit Completed

## 2024-11-28 ENCOUNTER — Ambulatory Visit: Admitting: Physical Therapy

## 2024-12-04 ENCOUNTER — Ambulatory Visit (INDEPENDENT_AMBULATORY_CARE_PROVIDER_SITE_OTHER): Admitting: Internal Medicine

## 2024-12-05 ENCOUNTER — Ambulatory Visit: Admitting: Physical Therapy

## 2024-12-09 ENCOUNTER — Ambulatory Visit: Admitting: Family Medicine

## 2024-12-12 ENCOUNTER — Ambulatory Visit: Admitting: Physical Therapy

## 2024-12-19 ENCOUNTER — Ambulatory Visit: Admitting: Physical Therapy

## 2024-12-27 ENCOUNTER — Ambulatory Visit: Admitting: Physical Therapy
